# Patient Record
Sex: Female | Born: 1950
Health system: Southern US, Community
[De-identification: ages and names within clinical notes are randomized; demographics above are authoritative.]

## PROBLEM LIST (undated history)

## (undated) DIAGNOSIS — E119 Type 2 diabetes mellitus without complications: Secondary | ICD-10-CM

## (undated) DIAGNOSIS — R079 Chest pain, unspecified: Secondary | ICD-10-CM

## (undated) DIAGNOSIS — M069 Rheumatoid arthritis, unspecified: Secondary | ICD-10-CM

## (undated) DIAGNOSIS — I10 Essential (primary) hypertension: Secondary | ICD-10-CM

## (undated) DIAGNOSIS — E785 Hyperlipidemia, unspecified: Secondary | ICD-10-CM

## (undated) DIAGNOSIS — K219 Gastro-esophageal reflux disease without esophagitis: Secondary | ICD-10-CM

## (undated) HISTORY — DX: Gastro-esophageal reflux disease without esophagitis: K21.9

## (undated) HISTORY — DX: Rheumatoid arthritis, unspecified: M06.9

## (undated) HISTORY — DX: Hyperlipidemia, unspecified: E78.5

## (undated) HISTORY — PX: CARPAL TUNNEL RELEASE: SHX101

## (undated) HISTORY — PX: TONSILLECTOMY: SUR1361

## (undated) HISTORY — DX: Chest pain, unspecified: R07.9

## (undated) HISTORY — PX: NEUROPLASTY / TRANSPOSITION MEDIAN NERVE AT CARPAL TUNNEL: SUR893

---

## 1997-11-08 ENCOUNTER — Ambulatory Visit (HOSPITAL_BASED_OUTPATIENT_CLINIC_OR_DEPARTMENT_OTHER): Admission: RE | Admit: 1997-11-08 | Discharge: 1997-11-08 | Payer: Self-pay | Admitting: Orthopedic Surgery

## 1998-12-08 ENCOUNTER — Encounter: Payer: Self-pay | Admitting: Interventional Cardiology

## 1998-12-08 ENCOUNTER — Ambulatory Visit (HOSPITAL_COMMUNITY): Admission: RE | Admit: 1998-12-08 | Discharge: 1998-12-08 | Payer: Self-pay | Admitting: Interventional Cardiology

## 2000-06-17 HISTORY — PX: ROUX-EN-Y GASTRIC BYPASS: SHX1104

## 2008-04-14 ENCOUNTER — Ambulatory Visit (HOSPITAL_BASED_OUTPATIENT_CLINIC_OR_DEPARTMENT_OTHER): Admission: RE | Admit: 2008-04-14 | Discharge: 2008-04-14 | Payer: Self-pay | Admitting: Orthopedic Surgery

## 2009-02-09 ENCOUNTER — Ambulatory Visit (HOSPITAL_COMMUNITY): Admission: RE | Admit: 2009-02-09 | Discharge: 2009-02-09 | Payer: Self-pay | Admitting: Dermatology

## 2009-06-17 HISTORY — PX: CORONARY ANGIOPLASTY WITH STENT PLACEMENT: SHX49

## 2009-12-08 ENCOUNTER — Inpatient Hospital Stay (HOSPITAL_BASED_OUTPATIENT_CLINIC_OR_DEPARTMENT_OTHER): Admission: RE | Admit: 2009-12-08 | Discharge: 2009-12-08 | Payer: Self-pay | Admitting: Interventional Cardiology

## 2009-12-14 ENCOUNTER — Ambulatory Visit (HOSPITAL_COMMUNITY): Admission: RE | Admit: 2009-12-14 | Discharge: 2009-12-15 | Payer: Self-pay | Admitting: Interventional Cardiology

## 2010-01-04 ENCOUNTER — Encounter (HOSPITAL_COMMUNITY): Admission: RE | Admit: 2010-01-04 | Discharge: 2010-03-16 | Payer: Self-pay | Admitting: Interventional Cardiology

## 2010-03-17 ENCOUNTER — Encounter (HOSPITAL_COMMUNITY): Admission: RE | Admit: 2010-03-17 | Discharge: 2010-05-16 | Payer: Self-pay | Admitting: Interventional Cardiology

## 2010-08-30 LAB — GLUCOSE, CAPILLARY
Glucose-Capillary: 69 mg/dL — ABNORMAL LOW (ref 70–99)
Glucose-Capillary: 74 mg/dL (ref 70–99)
Glucose-Capillary: 97 mg/dL (ref 70–99)

## 2010-08-31 LAB — GLUCOSE, CAPILLARY: Glucose-Capillary: 92 mg/dL (ref 70–99)

## 2010-09-01 LAB — GLUCOSE, CAPILLARY
Glucose-Capillary: 105 mg/dL — ABNORMAL HIGH (ref 70–99)
Glucose-Capillary: 140 mg/dL — ABNORMAL HIGH (ref 70–99)
Glucose-Capillary: 150 mg/dL — ABNORMAL HIGH (ref 70–99)
Glucose-Capillary: 156 mg/dL — ABNORMAL HIGH (ref 70–99)
Glucose-Capillary: 68 mg/dL — ABNORMAL LOW (ref 70–99)
Glucose-Capillary: 92 mg/dL (ref 70–99)
Glucose-Capillary: 92 mg/dL (ref 70–99)

## 2010-09-02 LAB — BASIC METABOLIC PANEL
BUN: 11 mg/dL (ref 6–23)
CO2: 27 mEq/L (ref 19–32)
Calcium: 8.9 mg/dL (ref 8.4–10.5)
Chloride: 108 mEq/L (ref 96–112)
Creatinine, Ser: 0.85 mg/dL (ref 0.4–1.2)
GFR calc Af Amer: >60 mL/min (ref >60)
GFR calc non Af Amer: >60 mL/min (ref >60)
Glucose, Bld: 97 mg/dL (ref 70–99)
Potassium: 4.8 mEq/L (ref 3.5–5.1)
Sodium: 143 mEq/L (ref 135–145)

## 2010-09-02 LAB — CBC
HCT: 29.6 % — ABNORMAL LOW (ref 36.0–46.0)
Hemoglobin: 9.9 g/dL — ABNORMAL LOW (ref 12.0–15.0)
MCH: 29.5 pg (ref 26.0–34.0)
MCHC: 33.5 g/dL (ref 30.0–36.0)
MCV: 88.1 fL (ref 78.0–100.0)
Platelets: 159 10*3/uL (ref 150–400)
RBC: 3.37 MIL/uL — ABNORMAL LOW (ref 3.87–5.11)
RDW: 12.3 % (ref 11.5–15.5)
WBC: 7.6 10*3/uL (ref 4.0–10.5)

## 2010-09-02 LAB — POCT I-STAT GLUCOSE
Glucose, Bld: 69 mg/dL — ABNORMAL LOW (ref 70–99)
Operator id: 194801

## 2010-09-02 LAB — GLUCOSE, CAPILLARY
Glucose-Capillary: 86 mg/dL (ref 70–99)
Glucose-Capillary: 133 mg/dL — ABNORMAL HIGH (ref 70–99)
Glucose-Capillary: 66 mg/dL — ABNORMAL LOW (ref 70–99)
Glucose-Capillary: 74 mg/dL (ref 70–99)
Glucose-Capillary: 86 mg/dL (ref 70–99)
Glucose-Capillary: 88 mg/dL (ref 70–99)

## 2010-10-30 NOTE — Op Note (Signed)
Darlene Rivera            ACCOUNT NO.:  [id number]   MEDICAL RECORD NO.:  [id number]          PATIENT TYPE:  AMB   LOCATION:  DSC                          FACILITY:  [facility]   PHYSICIAN:  Katy Fitch. Sypher, M.D. DATE OF BIRTH:  1951-05-31   DATE OF PROCEDURE:  04/14/2008  DATE OF DISCHARGE:                               OPERATIVE REPORT   PREOPERATIVE DIAGNOSIS:  Left carpal tunnel syndrome and left ulnar  nerve compression at elbow.   POSTOPERATIVE DIAGNOSIS:  Left carpal tunnel syndrome and left ulnar  nerve compression at elbow.   OPERATIONS:  Release of left transverse carpal ligament and release of  left ulnar nerve at cubital tunnel in situ.   OPERATING SURGEON:  Katy Fitch. Sypher, MD   ASSISTANT:  Marveen Reeks Dasnoit, PA-C   ANESTHESIA:  General by endotracheal technique.   SUPERVISING ANESTHESIOLOGIST:  Kaylyn Layer. Michelle Piper, MD   INDICATIONS:  Darlene Rivera is a 61 year old woman with diabetes referred  by Dr. Margaretmary Bayley for evaluation and management of arm numbness and  arm pain.  Clinical examination revealed probable rheumatoid arthritis.  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  She has been treated for this  disorder, but still has nerve compression.  Electrodiagnostic studies  confirmed left carpal tunnel syndrome and left ulnar neuropathy at the  elbow.  She is now brought to the operating room for decompression of  the ulnar nerve and median nerve.   PROCEDURE:  Darlene Rivera was brought to the operating room and  placed in supine position upon the operating table.   Following the induction of general anesthesia by LMA technique, the left  arm was prepped with Betadine soap solution, sterilely draped.  A  pneumatic tourniquet was applied to the proximal brachium.  Following  exsanguination of the left arm with an Esmarch bandage, the arterial  tourniquet was inflated to 220 mmHg.  The  procedure commenced with a  short incision in the palm in the line of the ring finger.  Subcutaneous  tissues were carefully divided revealing the palmar fascia.  This was  split longitudinally to reveal the common sensory branch of the median  nerve.  These were followed with the transcarpal ligament, which was  separated from the median nerve.  The ligament was released along its  ulnar border into the distal forearm.  The canal was inspected for  masses, none were identified.  The wound was repaired with intradermal 3-  0 Prolene and Steri-Strips.  Attention was then directed to the elbow.  A 4-cm incision was fashioned posterior to the medial epicondyle.  This  was taken down through the subcutaneous fascia to reveal the posterior  branch of the medial antebrachial cutaneous nerve.  This was gently  retracted.  The arcuate ligament was identified and released.  The ulnar  nerve was decompressed at 6 cm above the epicondyle and 6 cm distally.   The nerve was stable in situ.  Bleeding points were electrocauterized  with bipolar current.  The wound was then inspected for bleeding points,  subsequently repaired with  subcutaneous suture of 3-0 Vicryl and  intradermal through Prolene with Steri-Strips.  There were no apparent  complications.   Darlene Rivera tolerated the surgery and anesthesia well.  She was  transferred to the recovery room with stable signs.   She will be discharged to the care of her family with prescription for  Percocet 5 mg one p.o. q.4-6 h. p.r.n. pain, 20 tablets, without refill.      Katy Fitch Sypher, M.D.  Electronically Signed     RVS/MEDQ  D:  04/14/2008  T:  04/14/2008  Job:  [id number]   cc:   Margaretmary Bayley, M.D.

## 2011-03-19 LAB — POCT HEMOGLOBIN-HEMACUE: Hemoglobin: 9.4 — ABNORMAL LOW

## 2011-03-19 LAB — BASIC METABOLIC PANEL
BUN: 12
CO2: 29
Calcium: 9.7
Chloride: 105
Creatinine, Ser: 0.72
GFR calc Af Amer: >60
GFR calc non Af Amer: >60
Glucose, Bld: 128 — ABNORMAL HIGH
Potassium: 3.3 — ABNORMAL LOW
Sodium: 144

## 2011-03-19 LAB — GLUCOSE, CAPILLARY
Glucose-Capillary: 278 — ABNORMAL HIGH
Glucose-Capillary: 78
Glucose-Capillary: 78

## 2013-03-17 ENCOUNTER — Emergency Department (HOSPITAL_BASED_OUTPATIENT_CLINIC_OR_DEPARTMENT_OTHER)
Admission: EM | Admit: 2013-03-17 | Discharge: 2013-03-17 | Disposition: A | Discharge status: Home / Self Care | Payer: Managed Care, Other (non HMO) | Attending: Emergency Medicine | Admitting: Emergency Medicine

## 2013-03-17 ENCOUNTER — Encounter (HOSPITAL_BASED_OUTPATIENT_CLINIC_OR_DEPARTMENT_OTHER): Payer: Self-pay | Admitting: Emergency Medicine

## 2013-03-17 DIAGNOSIS — E119 Type 2 diabetes mellitus without complications: Secondary | ICD-10-CM | POA: Insufficient documentation

## 2013-03-17 DIAGNOSIS — R490 Dysphonia: Secondary | ICD-10-CM | POA: Insufficient documentation

## 2013-03-17 DIAGNOSIS — R112 Nausea with vomiting, unspecified: Secondary | ICD-10-CM | POA: Insufficient documentation

## 2013-03-17 DIAGNOSIS — J209 Acute bronchitis, unspecified: Secondary | ICD-10-CM | POA: Insufficient documentation

## 2013-03-17 DIAGNOSIS — R739 Hyperglycemia, unspecified: Secondary | ICD-10-CM

## 2013-03-17 DIAGNOSIS — I2510 Atherosclerotic heart disease of native coronary artery without angina pectoris: Secondary | ICD-10-CM | POA: Insufficient documentation

## 2013-03-17 DIAGNOSIS — Z9861 Coronary angioplasty status: Secondary | ICD-10-CM | POA: Insufficient documentation

## 2013-03-17 DIAGNOSIS — Z794 Long term (current) use of insulin: Secondary | ICD-10-CM | POA: Insufficient documentation

## 2013-03-17 DIAGNOSIS — I10 Essential (primary) hypertension: Secondary | ICD-10-CM | POA: Insufficient documentation

## 2013-03-17 DIAGNOSIS — R42 Dizziness and giddiness: Secondary | ICD-10-CM | POA: Insufficient documentation

## 2013-03-17 HISTORY — DX: Essential (primary) hypertension: I10

## 2013-03-17 HISTORY — DX: Type 2 diabetes mellitus without complications: E11.9

## 2013-03-17 LAB — POCT I-STAT 3, VENOUS BLOOD GAS (G3P V)
Acid-base deficit: 2 mmol/L (ref 0.0–2.0)
Bicarbonate: 22.2 mEq/L (ref 20.0–24.0)
O2 Saturation: 69 %
TCO2: 23 mmol/L (ref 0–100)
pCO2, Ven: 35.3 mmHg — ABNORMAL LOW (ref 45.0–50.0)
pH, Ven: 7.407 — ABNORMAL HIGH (ref 7.250–7.300)
pO2, Ven: 36 mmHg (ref 30.0–45.0)

## 2013-03-17 LAB — BASIC METABOLIC PANEL
BUN: 17 mg/dL (ref 6–23)
CO2: 25 mEq/L (ref 19–32)
Calcium: 9.5 mg/dL (ref 8.4–10.5)
Chloride: 104 mEq/L (ref 96–112)
Creatinine, Ser: 0.8 mg/dL (ref 0.50–1.10)
GFR calc Af Amer: 90 mL/min — ABNORMAL LOW (ref >90)
GFR calc non Af Amer: 77 mL/min — ABNORMAL LOW (ref >90)
Glucose, Bld: 230 mg/dL — ABNORMAL HIGH (ref 70–99)
Potassium: 4.3 mEq/L (ref 3.5–5.1)
Sodium: 140 mEq/L (ref 135–145)

## 2013-03-17 LAB — TROPONIN I: Troponin I: <0.3 ng/mL (ref <0.30)

## 2013-03-17 LAB — CBC WITH DIFFERENTIAL/PLATELET
Basophils Absolute: 0 10*3/uL (ref 0.0–0.1)
Basophils Relative: 0 % (ref 0–1)
Eosinophils Absolute: 0.1 10*3/uL (ref 0.0–0.7)
Eosinophils Relative: 1 % (ref 0–5)
HCT: 33.2 % — ABNORMAL LOW (ref 36.0–46.0)
Hemoglobin: 10.4 g/dL — ABNORMAL LOW (ref 12.0–15.0)
Lymphocytes Relative: 11 % — ABNORMAL LOW (ref 12–46)
Lymphs Abs: 1.4 10*3/uL (ref 0.7–4.0)
MCH: 27.7 pg (ref 26.0–34.0)
MCHC: 31.3 g/dL (ref 30.0–36.0)
MCV: 88.3 fL (ref 78.0–100.0)
Monocytes Absolute: 0.8 10*3/uL (ref 0.1–1.0)
Monocytes Relative: 6 % (ref 3–12)
Neutro Abs: 10.4 10*3/uL — ABNORMAL HIGH (ref 1.7–7.7)
Neutrophils Relative %: 82 % — ABNORMAL HIGH (ref 43–77)
Platelets: 174 10*3/uL (ref 150–400)
RBC: 3.76 MIL/uL — ABNORMAL LOW (ref 3.87–5.11)
RDW: 12.4 % (ref 11.5–15.5)
WBC: 12.7 10*3/uL — ABNORMAL HIGH (ref 4.0–10.5)

## 2013-03-17 LAB — GLUCOSE, CAPILLARY
Glucose-Capillary: 300 mg/dL — ABNORMAL HIGH (ref 70–99)
Glucose-Capillary: 149 mg/dL — ABNORMAL HIGH (ref 70–99)

## 2013-03-17 MED ORDER — SODIUM CHLORIDE 0.9 % IV BOLUS (SEPSIS)
1000.0000 mL | Freq: Once | INTRAVENOUS | Status: AC
  Administered 2013-03-17: 1000 mL via INTRAVENOUS

## 2013-03-17 NOTE — ED Notes (Signed)
Pt reports her CBG prior to arrival was greater than 400, states she took Novolog 70/30, 20U 1 hour ago - did not take morning dose, on antibiotics for URI.

## 2013-03-17 NOTE — ED Provider Notes (Signed)
I assumed care of this patient from Dr. Elesa Massed. BMP and troponin pending.  BMP shows no electrolyte abnormalities and troponin is negative. Patient will be discharged home with PCP followup.'  Labs Reviewed  GLUCOSE, CAPILLARY - Abnormal; Notable for the following:    Glucose-Capillary 300 (*)    All other components within normal limits  CBC WITH DIFFERENTIAL - Abnormal; Notable for the following:    WBC 12.7 (*)    RBC 3.76 (*)    Hemoglobin 10.4 (*)    HCT 33.2 (*)    Neutrophils Relative % 82 (*)    Neutro Abs 10.4 (*)    Lymphocytes Relative 11 (*)    All other components within normal limits  BASIC METABOLIC PANEL - Abnormal; Notable for the following:    Glucose, Bld 230 (*)    GFR calc non Af Amer 77 (*)    GFR calc Af Amer 90 (*)    All other components within normal limits  POCT I-STAT 3, BLOOD GAS (G3P V) - Abnormal; Notable for the following:    pH, Ven 7.407 (*)    pCO2, Ven 35.3 (*)    All other components within normal limits  TROPONIN I     Shon Baton, MD 03/17/13 1623

## 2013-03-17 NOTE — ED Notes (Signed)
20 units novolog 1 hr ago Pt states "it was 400 before"

## 2013-03-17 NOTE — ED Provider Notes (Signed)
TIME SEEN: 1:52 PM  CHIEF COMPLAINT: Hyperglycemia  HPI: Patient is a 62 year old female with a history of insulin-dependent diabetes, hypertension, CAD status post stent placement, gastric bypass who presents the emergency department with hyperglycemia. Patient reports that for the past 2-3 days she has had hoarse voice, productive cough with yellow sputum. She was seen by her primary care physician today and diagnosed with bronchitis and started on Mucinex and azithromycin. She did not eat before going to her doctor's office and on the way to work stopped at [employer] and had [employer] minis and a sweet tea.  At work she gave herself 20 units of NovoLog 70/30 and then began eating. She states after eating, she began feeling lightheaded, nauseous, began sweating. She took her sugar and it was greater than 470. She states her glucose is never greater than 200. She has never had DKA. She did have one episode of vomiting in the car on the way to the ED. She denies that she had any chest pain or chest discomfort or shortness of breath. She denies that the symptoms are similar to her prior heart disease where she had neck and jaw pain. She reports that she is feeling much better and her blood glucose in the ED is 300.  She denies any recent fever. No recent international travel. No known sick contacts.  ROS: See HPI Constitutional: no fever  Eyes: no drainage  ENT: no runny nose   Cardiovascular:  no chest pain  Resp: no SOB  GI: vomiting GU: no dysuria Integumentary: no rash  Allergy: no hives  Musculoskeletal: no leg swelling  Neurological: no slurred speech ROS otherwise negative  PAST MEDICAL HISTORY/PAST SURGICAL HISTORY:  Past Medical History  Diagnosis Date  . Diabetes mellitus without complication   . Hypertension     MEDICATIONS:  Prior to Admission medications   Not on File    ALLERGIES:  Allergies  Allergen Reactions  . Sulfa Antibiotics     SOCIAL HISTORY:   History  Substance Use Topics  . Smoking status: Never Smoker   . Smokeless tobacco: Not on file  . Alcohol Use: No    FAMILY HISTORY: History reviewed. No pertinent family history.  EXAM: BP 142/84  Temp(Src) 97.6 F (36.4 C) (Oral)  Resp 18  SpO2 100% CONSTITUTIONAL: Alert and oriented and responds appropriately to questions. Well-appearing; well-nourished HEAD: Normocephalic EYES: Conjunctivae clear, PERRL ENT: normal nose; no rhinorrhea; moist mucous membranes; pharynx without lesions noted, TMs are clear bilaterally NECK: Supple, no meningismus, no LAD  CARD: RRR; S1 and S2 appreciated; no murmurs, no clicks, no rubs, no gallops RESP: Normal chest excursion without splinting or tachypnea; breath sounds clear and equal bilaterally; no wheezes, no rhonchi, no rales,  ABD/GI: Normal bowel sounds; non-distended; soft, non-tender, no rebound, no guarding BACK:  The back appears normal and is non-tender to palpation, there is no CVA tenderness EXT: Normal ROM in all joints; non-tender to palpation; no edema; normal capillary refill; no cyanosis    SKIN: Normal color for age and race; warm NEURO: Moves all extremities equally, no facial droop, no slurred speech PSYCH: The patient's mood and manner are appropriate. Grooming and personal hygiene are appropriate.  MEDICAL DECISION MAKING: Patient with upper respiratory symptoms for the past several days. She has been started on azithromycin by her primary care physician. Have not concerned for pneumonia as her lungs are clear her oxygen saturation is 100% and she is afebrile. Patient's hyperglycemia is likely in the setting  of poor diet control today. I will check basic labs, EKG, troponin and give IV fluids. Anticipate discharge home.    Date: 03/17/2013 14:04  Rate: 79  Rhythm: normal sinus rhythm  QRS Axis: normal  Intervals: normal  ST/T Wave abnormalities: normal  Conduction Disutrbances: none  Narrative Interpretation:  unremarkable; no ischemic changes, no arrhythmia     ED PROGRESS: PH is 7.407, PCO2 35.3, bicarbonate 22.2.  CBC shows mild leukocytosis with left shift. BMP and troponin pending. Patient is still asymptomatic. Hemodynamically stable. Signed out to Dr. Wilkie Aye who will followup on labs. Patient updated on plan.     Layla Maw Eryck Negron, DO 03/17/13 1544

## 2013-03-17 NOTE — ED Notes (Signed)
Pt reports high blood sugar today at the dr office. She went for URI and cough s/s. Also reports taking cough syrup OTC, sweet tea and [employer] minis x2

## 2013-03-20 ENCOUNTER — Other Ambulatory Visit: Payer: Self-pay | Admitting: Interventional Cardiology

## 2013-05-29 ENCOUNTER — Encounter: Payer: Self-pay | Admitting: Interventional Cardiology

## 2013-05-29 ENCOUNTER — Encounter: Payer: Self-pay | Admitting: *Deleted

## 2013-05-29 DIAGNOSIS — M069 Rheumatoid arthritis, unspecified: Secondary | ICD-10-CM | POA: Insufficient documentation

## 2013-05-29 DIAGNOSIS — R079 Chest pain, unspecified: Secondary | ICD-10-CM | POA: Insufficient documentation

## 2013-05-29 DIAGNOSIS — K219 Gastro-esophageal reflux disease without esophagitis: Secondary | ICD-10-CM | POA: Insufficient documentation

## 2013-05-29 DIAGNOSIS — E118 Type 2 diabetes mellitus with unspecified complications: Secondary | ICD-10-CM | POA: Insufficient documentation

## 2013-05-29 DIAGNOSIS — E785 Hyperlipidemia, unspecified: Secondary | ICD-10-CM | POA: Insufficient documentation

## 2013-05-29 DIAGNOSIS — I10 Essential (primary) hypertension: Secondary | ICD-10-CM | POA: Insufficient documentation

## 2013-05-31 ENCOUNTER — Ambulatory Visit (INDEPENDENT_AMBULATORY_CARE_PROVIDER_SITE_OTHER): Payer: Managed Care, Other (non HMO) | Admitting: Interventional Cardiology

## 2013-05-31 ENCOUNTER — Encounter: Payer: Self-pay | Admitting: Interventional Cardiology

## 2013-05-31 VITALS — BP 142/86 | HR 78 | Ht 66.0 in | Wt 213.0 lb

## 2013-05-31 DIAGNOSIS — E785 Hyperlipidemia, unspecified: Secondary | ICD-10-CM

## 2013-05-31 DIAGNOSIS — R079 Chest pain, unspecified: Secondary | ICD-10-CM

## 2013-05-31 DIAGNOSIS — I2510 Atherosclerotic heart disease of native coronary artery without angina pectoris: Secondary | ICD-10-CM

## 2013-05-31 DIAGNOSIS — I10 Essential (primary) hypertension: Secondary | ICD-10-CM

## 2013-05-31 NOTE — Patient Instructions (Addendum)
Your physician recommends that you continue on your current medications as directed. Please refer to the Current Medication list given to you today.  Your physician has requested that you have a lexiscan myoview. For further information please visit [email or web address] Please follow instruction sheet, as given.  Follow up pending results of stress myoview

## 2013-05-31 NOTE — Progress Notes (Signed)
Patient ID: Darlene Rivera, female   DOB: 1951/01/22, 62 y.o.   MRN: [id number]    [address] [place], [place]  [address] Phone: [phone] Fax:  [phone]  Date:  05/31/2013   ID:  Darlene Rivera, DOB Jan 22, 1951, MRN [id number]  PCP:  Clifton Custard, MD   ASSESSMENT:  1. Chest tightness with left jaw radiation, uncertain etiology. Occurs abdominally with supine position. Rule out atypical angina 2. Coronary artery disease with prior history of circumflex DES 2011 and moderate LAD and right coronary disease. 3. Hypertension, controlled 4. Hyperlipidemia 5. Diabetes mellitus   PLAN:  1. Stress Cardiolite to rule out high risk substrate  2. Continue aggressive risk factor modification  3. Consider empiric trial of H2 blocker or proton pump inhibitor therapy if nuclear study is low risk.   SUBJECTIVE: Darlene Rivera is a 62 y.o. female who saw Dr. Chestine Spore recently. She has been having intermittent episodes of left neck jaw and upper chest discomfort. Many times is most noticeable when she is recumbent trying to get off to sleep. She does relatively physical activity at one of her jobs and has not noted any impairment in her abilities over the past 3 months. She rarely has any discomfort or dyspnea with these activities. This certainly been no change.   Wt Readings from Last 3 Encounters:  05/31/13 213 lb (96.616 kg)     Past Medical History  Diagnosis Date  . Diabetes mellitus without complication   . Hypertension   . Chest pain   . GERD (gastroesophageal reflux disease)   . Rheumatoid arthritis(714.0)   . Hyperlipidemia     Current Outpatient Prescriptions  Medication Sig Dispense Refill  . Canagliflozin (INVOKANA) 100 MG TABS Take by mouth daily.      . cholecalciferol (VITAMIN D) 1000 UNITS tablet Take 2,000 Units by mouth daily.      . ferrous fumarate (HEMOCYTE - 106 MG FE) 325 (106 FE) MG TABS tablet Take 1 tablet by mouth daily before  lunch.      . insulin lispro protamine-lispro (HUMALOG 75/25) (75-25) 100 UNIT/ML SUSP injection Inject into the skin daily.      . Omega-3 Fatty Acids (FISH OIL) 1000 MG CAPS Take 2,000 mg by mouth daily.      . SitaGLIPtin-MetFORMIN HCl (JANUMET XR) 50-1000 MG TB24 Take by mouth daily.      Marland Kitchen aspirin 325 MG tablet Take 325 mg by mouth daily.      . Cyanocobalamin (VITAMIN B-12 PO) Take 1 tablet by mouth daily.      Marland Kitchen lisinopril-hydrochlorothiazide (PRINZIDE,ZESTORETIC) 20-12.5 MG per tablet Take 1 tablet by mouth daily.      . metoprolol tartrate (LOPRESSOR) 25 MG tablet Take 25 mg by mouth 2 (two) times daily.      . Multiple Vitamin (MULTIVITAMIN) capsule Take 1 capsule by mouth daily.      . nitroGLYCERIN (NITROSTAT) 0.4 MG SL tablet Place 0.4 mg under the tongue every 5 (five) minutes as needed for chest pain.      . simvastatin (ZOCOR) 20 MG tablet TAKE 1 TABLET BY MOUTH EVERY EVENING  30 tablet  9   No current facility-administered medications for this visit.    Allergies:    Allergies  Allergen Reactions  . Sulfa Antibiotics     Social History:  The patient  reports that she has never smoked. She does not have any smokeless tobacco history on file. She reports that she does  not drink alcohol or use illicit drugs.   ROS:  Please see the history of present illness.   No blood in urine or stool . Does have lower extremity edema that develops as the day progresses.  All other systems reviewed and negative.   OBJECTIVE: VS:  BP 142/86  Pulse 78  Ht 5\' 6"  (1.676 m)  Wt 213 lb (96.616 kg)  BMI 34.40 kg/m2 Well nourished, well developed, in no acute distress, mildly obese with stable  HEENT: normal Neck: JVD flat. Carotid bruit absent  Cardiac:  normal S1, S2; RRR; no murmur Lungs:  clear to auscultation bilaterally, no wheezing, rhonchi or rales Abd: soft, nontender, no hepatomegaly Ext: Edema absent. Pulses 2+  Skin: warm and dry Neuro:  CNs 2-12 intact, no focal  abnormalities noted  EKG:  Normal       Signed, Darci Needle III, MD 05/31/2013 9:25 AM  Past Medical History  Diabetes mellitus x 30 years   Hypertension   Rheumatoid arthritis   GERD   DES stent to circumflex..12/14/09

## 2013-06-03 ENCOUNTER — Ambulatory Visit (HOSPITAL_COMMUNITY): Payer: Managed Care, Other (non HMO) | Attending: Cardiology | Admitting: Radiology

## 2013-06-03 ENCOUNTER — Encounter: Payer: Self-pay | Admitting: Cardiology

## 2013-06-03 VITALS — BP 149/59 | HR 89 | Ht 66.0 in | Wt 212.0 lb

## 2013-06-03 DIAGNOSIS — I2510 Atherosclerotic heart disease of native coronary artery without angina pectoris: Secondary | ICD-10-CM

## 2013-06-03 DIAGNOSIS — R079 Chest pain, unspecified: Secondary | ICD-10-CM

## 2013-06-03 MED ORDER — TECHNETIUM TC 99M SESTAMIBI GENERIC - CARDIOLITE: 33.0000 | Freq: Once | INTRAVENOUS | Status: AC | PRN

## 2013-06-03 MED ORDER — TECHNETIUM TC 99M SESTAMIBI GENERIC - CARDIOLITE: 11.0000 | Freq: Once | INTRAVENOUS | Status: AC | PRN

## 2013-06-03 MED ORDER — REGADENOSON 0.4 MG/5ML IV SOLN: 0.4000 mg | Freq: Once | INTRAVENOUS | Status: DC

## 2013-06-03 NOTE — Progress Notes (Signed)
[facility] SITE 3 NUCLEAR MED [address] [place], [place] [address] [phone]    Cardiology Nuclear Med Study  Darlene Rivera is a 62 y.o. female     MRN : [id number]     DOB: 1951-05-07  Procedure Date: 06/03/2013  Nuclear Med Background Indication for Stress Test:  Evaluation for Ischemia and Stent Patency History:  2011 Stent-CFx with residual nonobst. dz Cardiac Risk Factors: Family History - CAD, Hypertension, IDDM Type 2 and Lipids  Symptoms:  Chest Tightness with Rest and Exertion (last date of chest discomfort 2 days ago), Diaphoresis, Nausea, Palpitations and SOB   Nuclear Pre-Procedure Caffeine/Decaff Intake:  8:00pm NPO After: 8:00pm   Lungs:  clear O2 Sat: 98% on room air. IV 0.9% NS with Angio Cath:  22g  IV Site: R Hand  IV Started by:  Doyne Keel, CNMT  Chest Size (in):  42 Cup Size: D  Height: 5\' 6"  (1.676 m)  Weight:  212 lb (96.163 kg)  BMI:  Body mass index is 34.23 kg/(m^2). Tech Comments:  Held all am meds; didn't check CBG this am    Nuclear Med Study 1 or 2 day study: 1 day  Stress Test Type:  Lexiscan  Reading MD: Olga Millers, MD  Order Authorizing Provider:  Mendel Ryder, MD  Resting Radionuclide: Technetium 77m Sestamibi  Resting Radionuclide Dose: 11.0 mCi   Stress Radionuclide:  Technetium 9m Sestamibi  Stress Radionuclide Dose: 33.0 mCi           Stress Protocol Rest HR: 89 Stress HR: 116  Rest BP: 149/59 Stress BP: 171/42  Exercise Time (min): n/a METS: n/a           Dose of Adenosine (mg):  n/a Dose of Lexiscan: 0.4 mg  Dose of Atropine (mg): n/a Dose of Dobutamine: n/a mcg/kg/min (at max HR)  Stress Test Technologist: Nelson Chimes, BS-ES  Nuclear Technologist:  Domenic Polite, CNMT     Rest Procedure:  Myocardial perfusion imaging was performed at rest 45 minutes following the intravenous administration of Technetium 67m Sestamibi. Rest ECG: NSR - Normal EKG  Stress Procedure:  The patient received IV  Lexiscan 0.4 mg over 15-seconds.  Technetium 69m Sestamibi injected at 30-seconds.  Quantitative spect images were obtained after a 45 minute delay.  During the infusion of Lexiscan, the patient complained of slight SOB, leg discomfort and headache.  Symptoms began to resolve with the exception of the headache.   Stress ECG: No significant change from baseline ECG  QPS Raw Data Images:  Mild breast attenuation.  Normal left ventricular size. Stress Images:  There is decreased uptake in the lateral wall. Rest Images:  There is decreased uptake in the lateral wall. Subtraction (SDS):  No evidence of ischemia. Transient Ischemic Dilatation (Normal <1.22):  0.91 Lung/Heart Ratio (Normal <0.45):  0.33  Quantitative Gated Spect Images QGS EDV:  66 ml QGS ESV:  18 ml  Impression Exercise Capacity:  Lexiscan with no exercise. BP Response:  Normal blood pressure response. Clinical Symptoms:  There is dyspnea. ECG Impression:  No significant ST segment change suggestive of ischemia. Comparison with Prior Nuclear Study: No images to compare  Overall Impression:  Low risk stress nuclear study with small fixed lateral wall defect..  LV Ejection Fraction: 73%.  LV Wall Motion:  NL LV Function; NL Wall Motion

## 2013-06-08 ENCOUNTER — Telehealth: Payer: Self-pay

## 2013-06-08 NOTE — Telephone Encounter (Signed)
called to give pt nuclear study results.left message  at pt home with her sister. lmom on pt cell to return call

## 2013-06-08 NOTE — Telephone Encounter (Signed)
Follow up    Pt returned nurses call to get stress test results

## 2013-06-08 NOTE — Telephone Encounter (Signed)
Message copied by Jarvis Newcomer on Tue Jun 08, 2013  1:10 PM ------      Message from: Verdis Prime      Created: Mon Jun 07, 2013  6:11 PM       The nuclear study is only mildly abnormal. This is in the same region as the blood vessel with a stent. I would characterize the study is low risk and we should continue to follow clinically and treat with medication rather than doing procedures. If symptoms worsen she should call and we will need to perform coronary angiography. ------

## 2013-06-09 NOTE — Telephone Encounter (Signed)
Pt given results of nuclear study. The nuclear study is only mildly abnormal. This is in the same region as the blood vessel with a stent. I would characterize the study is low risk and we should continue to follow clinically and treat with medication rather than doing procedures. If symptoms worsen she should call and we will need to perform coronary angiography.pt verbalized understanding.

## 2013-06-09 NOTE — Telephone Encounter (Signed)
Message copied by Jarvis Newcomer on Wed Jun 09, 2013  8:51 AM ------      Message from: Verdis Prime      Created: Mon Jun 07, 2013  6:11 PM       The nuclear study is only mildly abnormal. This is in the same region as the blood vessel with a stent. I would characterize the study is low risk and we should continue to follow clinically and treat with medication rather than doing procedures. If symptoms worsen she should call and we will need to perform coronary angiography. ------

## 2013-10-07 ENCOUNTER — Telehealth: Payer: Self-pay | Admitting: Interventional Cardiology

## 2013-10-07 NOTE — Telephone Encounter (Signed)
lmom with pt sister for pt to call back.lmom on pt cell

## 2013-10-07 NOTE — Telephone Encounter (Signed)
New message          Pt is has a irregular heartbeat. Is pt in afib? Pt is going to be sedated for surgery.

## 2013-10-20 ENCOUNTER — Encounter: Payer: Self-pay | Admitting: Interventional Cardiology

## 2013-10-21 NOTE — Telephone Encounter (Signed)
Follow up      Given patient medical history - irregular heartbeat, can patient sedated for oral surgery.     What's the condition behind . Patient will be put under general anesthesia .

## 2013-10-22 NOTE — Telephone Encounter (Signed)
spoke with Joni Reining at pt dentist office.she sts that plan has change the pt will not be sedated for her dental procedure, just given local anethesia.clearance not needed.

## 2013-12-09 ENCOUNTER — Ambulatory Visit: Payer: Self-pay | Admitting: Interventional Cardiology

## 2014-01-06 ENCOUNTER — Ambulatory Visit: Payer: Self-pay | Admitting: Interventional Cardiology

## 2014-01-07 ENCOUNTER — Other Ambulatory Visit: Payer: Self-pay

## 2014-01-07 MED ORDER — SIMVASTATIN 20 MG PO TABS: ORAL_TABLET | ORAL | Status: DC

## 2014-01-24 ENCOUNTER — Other Ambulatory Visit: Payer: Self-pay | Admitting: *Deleted

## 2014-01-24 MED ORDER — METOPROLOL TARTRATE 25 MG PO TABS: 25.0000 mg | ORAL_TABLET | Freq: Two times a day (BID) | ORAL | Status: DC

## 2014-03-24 ENCOUNTER — Ambulatory Visit: Payer: Self-pay | Admitting: Interventional Cardiology

## 2014-05-11 ENCOUNTER — Ambulatory Visit (INDEPENDENT_AMBULATORY_CARE_PROVIDER_SITE_OTHER): Payer: Managed Care, Other (non HMO) | Admitting: Interventional Cardiology

## 2014-05-11 ENCOUNTER — Encounter: Payer: Self-pay | Admitting: Interventional Cardiology

## 2014-05-11 VITALS — BP 140/82 | HR 87 | Ht 66.0 in | Wt 214.8 lb

## 2014-05-11 DIAGNOSIS — E118 Type 2 diabetes mellitus with unspecified complications: Secondary | ICD-10-CM

## 2014-05-11 DIAGNOSIS — E785 Hyperlipidemia, unspecified: Secondary | ICD-10-CM

## 2014-05-11 DIAGNOSIS — I10 Essential (primary) hypertension: Secondary | ICD-10-CM

## 2014-05-11 DIAGNOSIS — I2510 Atherosclerotic heart disease of native coronary artery without angina pectoris: Secondary | ICD-10-CM

## 2014-05-11 NOTE — Patient Instructions (Signed)
Your physician wants you to follow-up in:  12 months. You will receive a reminder letter in the mail two months in advance. If you don't receive a letter, please call our office to schedule the follow-up appointment.  Your physician has requested that you have an abdominal aorta duplex. During this test, an ultrasound is used to evaluate the aorta. Allow 30 minutes for this exam. Do not eat after midnight the day before and avoid carbonated beverages

## 2014-05-11 NOTE — Progress Notes (Signed)
Patient ID: Darlene Rivera, female   DOB: 12/27/1950, 63 y.o.   MRN: [id number]    [address] [place], [place]  [address] Phone: [phone] Fax:  [phone]  Date:  05/11/2014   ID:  Darlene Rivera, DOB [birth year], MRN [id number]  PCP:  Clifton Custard, MD   ASSESSMENT:  1. Coronary artery disease with circumflex DES 2011 and moderate residual LAD disease 2. Diabetes mellitus, with vascular complications 3. Hyperlipidemia 4. Essential hypertension  PLAN:  1. Abdominal ultrasound to rule out abdominal aortic aneurysm 2. Continue same medical regimen 3. Aerobic exercise to reduce weight 4. I'll follow-up with me in one year   SUBJECTIVE: Darlene Rivera is a 63 y.o. female who is doing well. She has exertional dyspnea but no orthopnea. There is no peripheral edema. She denies angina. She takes medications as prescribed. Blood sugars have not been under good control. No neurological complaints.   Wt Readings from Last 3 Encounters:  05/11/14 214 lb 12.8 oz (97.433 kg)  06/03/13 212 lb (96.163 kg)  05/31/13 213 lb (96.616 kg)     Past Medical History  Diagnosis Date  . Diabetes mellitus without complication   . Hypertension   . Chest pain   . GERD (gastroesophageal reflux disease)   . Rheumatoid arthritis(714.0)   . Hyperlipidemia     Current Outpatient Prescriptions  Medication Sig Dispense Refill  . amoxicillin-clavulanate (AUGMENTIN) 500-125 MG per tablet 2 (two) times daily.    Marland Kitchen aspirin 325 MG tablet Take 325 mg by mouth daily.    . Calcium Carbonate-Vitamin D (CALCIUM-VITAMIN D) 500-200 MG-UNIT per tablet Take 1 tablet by mouth daily.    . Canagliflozin (INVOKANA) 100 MG TABS Take by mouth daily.    . cholecalciferol (VITAMIN D) 1000 UNITS tablet Take 2,000 Units by mouth daily.    . Cyanocobalamin (VITAMIN B-12 PO) Take 1 tablet by mouth daily.    . ferrous fumarate (HEMOCYTE - 106 MG FE) 325 (106 FE) MG TABS tablet Take 1 tablet by mouth  daily before lunch.    . fluocinonide cream (LIDEX) 0.05 % as needed.    Marland Kitchen lisinopril-hydrochlorothiazide (PRINZIDE,ZESTORETIC) 20-12.5 MG per tablet Take 1 tablet by mouth daily.    . metoprolol tartrate (LOPRESSOR) 25 MG tablet Take 1 tablet (25 mg total) by mouth 2 (two) times daily. 60 tablet 1  . Multiple Vitamin (MULTIVITAMIN) capsule Take 1 capsule by mouth daily.    . nitroGLYCERIN (NITROSTAT) 0.4 MG SL tablet Place 0.4 mg under the tongue every 5 (five) minutes as needed for chest pain.    Marland Kitchen NOVOLOG MIX 70/30 FLEXPEN (70-30) 100 UNIT/ML Pen 2 (two) times daily.  99  . Omega-3 Fatty Acids (FISH OIL) 1000 MG CAPS Take 2,000 mg by mouth daily.    . simvastatin (ZOCOR) 20 MG tablet TAKE 1 TABLET BY MOUTH EVERY EVENING 30 tablet 9  . SitaGLIPtin-MetFORMIN HCl (JANUMET XR) 50-1000 MG TB24 Take by mouth daily.     No current facility-administered medications for this visit.    Allergies:    Allergies  Allergen Reactions  . Sulfa Antibiotics     Social History:  The patient  reports that she has never smoked. She does not have any smokeless tobacco history on file. She reports that she does not drink alcohol or use illicit drugs.   ROS:  Please see the history of present illness.   Denies palpitations. Denies headache. Denies claudication.   All other systems reviewed and  negative.   OBJECTIVE: VS:  BP 140/82 mmHg  Pulse 87  Ht 5\' 6"  (1.676 m)  Wt 214 lb 12.8 oz (97.433 kg)  BMI 34.69 kg/m2 Well nourished, well developed, in no acute distress, obese HEENT: normal Neck: JVD flat. Carotid bruit absent  Cardiac:  normal S1, S2; RRR; no murmur Lungs:  clear to auscultation bilaterally, no wheezing, rhonchi or rales Abd: soft, nontender, no hepatomegaly Ext: Edema absent. Pulses dorsalis pedis 2+ bilateral Skin: warm and dry Neuro:  CNs 2-12 intact, no focal abnormalities noted  EKG:  Normal with normal sinus rhythm       Signed, Darci Needle III, MD 05/11/2014 2:09 PM

## 2014-05-21 ENCOUNTER — Other Ambulatory Visit: Payer: Self-pay | Admitting: Interventional Cardiology

## 2014-06-08 ENCOUNTER — Ambulatory Visit (HOSPITAL_COMMUNITY): Payer: Managed Care, Other (non HMO) | Attending: Cardiovascular Disease | Admitting: Cardiology

## 2014-06-08 DIAGNOSIS — I10 Essential (primary) hypertension: Secondary | ICD-10-CM

## 2014-06-08 DIAGNOSIS — I700 Atherosclerosis of aorta: Secondary | ICD-10-CM | POA: Diagnosis not present

## 2014-06-08 DIAGNOSIS — E119 Type 2 diabetes mellitus without complications: Secondary | ICD-10-CM | POA: Diagnosis not present

## 2014-06-08 DIAGNOSIS — E785 Hyperlipidemia, unspecified: Secondary | ICD-10-CM | POA: Insufficient documentation

## 2014-06-08 DIAGNOSIS — I2510 Atherosclerotic heart disease of native coronary artery without angina pectoris: Secondary | ICD-10-CM

## 2014-06-08 NOTE — Progress Notes (Signed)
Abdominal Aorta Duplex performed  

## 2014-06-21 ENCOUNTER — Telehealth: Payer: Self-pay

## 2014-06-21 NOTE — Telephone Encounter (Signed)
-----   Message from Lesleigh Noe, MD sent at 06/17/2014  6:02 PM EST ----- No aneurysm

## 2014-06-21 NOTE — Telephone Encounter (Signed)
called to give to give pt  AAA duplex results. lmtcb

## 2014-08-22 ENCOUNTER — Other Ambulatory Visit: Payer: Self-pay | Admitting: Interventional Cardiology

## 2014-10-31 ENCOUNTER — Other Ambulatory Visit: Payer: Self-pay | Admitting: Interventional Cardiology

## 2015-02-26 ENCOUNTER — Other Ambulatory Visit: Payer: Self-pay | Admitting: Interventional Cardiology

## 2015-04-13 ENCOUNTER — Ambulatory Visit: Payer: Self-pay | Admitting: Allergy and Immunology

## 2015-05-27 ENCOUNTER — Other Ambulatory Visit: Payer: Self-pay | Admitting: Interventional Cardiology

## 2015-06-08 ENCOUNTER — Encounter: Payer: Self-pay | Admitting: Interventional Cardiology

## 2015-06-26 ENCOUNTER — Ambulatory Visit: Payer: Managed Care, Other (non HMO) | Admitting: Physician Assistant

## 2015-06-27 ENCOUNTER — Encounter: Payer: Self-pay | Admitting: Interventional Cardiology

## 2015-06-27 ENCOUNTER — Encounter: Payer: Self-pay | Admitting: Physician Assistant

## 2015-06-27 ENCOUNTER — Ambulatory Visit (INDEPENDENT_AMBULATORY_CARE_PROVIDER_SITE_OTHER): Payer: Managed Care, Other (non HMO) | Admitting: Physician Assistant

## 2015-06-27 VITALS — BP 130/76 | HR 93 | Wt 220.0 lb

## 2015-06-27 DIAGNOSIS — R0789 Other chest pain: Secondary | ICD-10-CM | POA: Diagnosis not present

## 2015-06-27 DIAGNOSIS — R0989 Other specified symptoms and signs involving the circulatory and respiratory systems: Secondary | ICD-10-CM

## 2015-06-27 MED ORDER — ISOSORBIDE MONONITRATE ER 30 MG PO TB24: 30.0000 mg | ORAL_TABLET | Freq: Every day | ORAL | Status: DC

## 2015-06-27 NOTE — Patient Instructions (Signed)
Your physician has recommended you make the following change in your medication:  1.) start IMDUR 30 mg once daily  Your physician has requested that you have a lexiscan myoview. For further information please visit [email or web address] Please follow instruction sheet, as given.  Your physician has requested that you have a carotid duplex. This test is an ultrasound of the carotid arteries in your neck. It looks at blood flow through these arteries that supply the brain with blood. Allow one hour for this exam. There are no restrictions or special instructions.  Your physician recommends that you schedule a follow-up appointment in: 1 month with Dr. Katrinka Blazing or Jacolyn Reedy.

## 2015-06-27 NOTE — Assessment & Plan Note (Signed)
Patient had DES to the circumflex in 2011 with residual 50% LAD and 60% RCA. She is having some exertional chest tightness. Proceed with Lexi scan Myoview.

## 2015-06-27 NOTE — Assessment & Plan Note (Signed)
Blood Pressure controlled 

## 2015-06-27 NOTE — Assessment & Plan Note (Signed)
Patient recently had blood work by Dr. Chestine Spore and will obtain a copy of her lipid panel for [place].

## 2015-06-27 NOTE — Progress Notes (Signed)
Cardiology Office Note   Date:  06/27/2015   ID:  Darlene Rivera, DOB July 12, 1950, MRN [id number]  PCP:  Laurena Slimmer, MD  Cardiologist: Dr. Verdis Prime  Chief Complaint: Yearly follow-up/chest pain    History of Present Illness: Darlene Rivera is a 65 y.o. female who presents for yearly follow-up. She has a history of CAD status post DES to the circumflex in 2011 with moderate residual LAD disease. She also has hypertension, hyperlipidemia and diabetes mellitus. Abdominal aortic ultrasound last year was negative for AAA.  Patient's office changed in October and she has to climb stairs to get there. She noticed with climbing stairs she becomes short of breath and has a little chest tightness. She stops halfway up and the pain eases and she continues on. She has had no rest pain. She has had no radiation of the pain like she did in 2011. She has gained weight and her hemoglobin A1c is 8.1.    Past Medical History  Diagnosis Date  . Diabetes mellitus without complication (HCC)   . Hypertension   . Chest pain   . GERD (gastroesophageal reflux disease)   . Rheumatoid arthritis(714.0)   . Hyperlipidemia     Past Surgical History  Procedure Laterality Date  . Coronary stent placement    . Gastric bypass    . Carpal tunnel release       Current Outpatient Prescriptions  Medication Sig Dispense Refill  . Calcium Carbonate-Vitamin D (CALCIUM-VITAMIN D) 500-200 MG-UNIT per tablet Take 1 tablet by mouth daily.    . cholecalciferol (VITAMIN D) 1000 UNITS tablet Take 2,000 Units by mouth daily.    . Cyanocobalamin (VITAMIN B-12 PO) Take 1 tablet by mouth daily.    . ferrous fumarate (HEMOCYTE - 106 MG FE) 325 (106 FE) MG TABS tablet Take 1 tablet by mouth daily before lunch.    . fluocinonide cream (LIDEX) 0.05 % as needed.    Marland Kitchen lisinopril-hydrochlorothiazide (PRINZIDE,ZESTORETIC) 20-12.5 MG per tablet Take 1 tablet by mouth daily.    . metoprolol tartrate (LOPRESSOR) 25 MG tablet  TAKE 1 TABLET BY MOUTH TWICE A DAY. PLEASE SCHEDULE APPOINTMENT 180 tablet 0  . Multiple Vitamin (MULTIVITAMIN) capsule Take 1 capsule by mouth daily.    . nitroGLYCERIN (NITROSTAT) 0.4 MG SL tablet Place 0.4 mg under the tongue every 5 (five) minutes as needed for chest pain.    Marland Kitchen NOVOLOG MIX 70/30 FLEXPEN (70-30) 100 UNIT/ML Pen 2 (two) times daily.  99  . Omega-3 Fatty Acids (FISH OIL) 1000 MG CAPS Take 2,000 mg by mouth daily.    . simvastatin (ZOCOR) 20 MG tablet TAKE 1 TABLET BY MOUTH EVERY EVENING 30 tablet 9  . SitaGLIPtin-MetFORMIN HCl (JANUMET XR) 50-1000 MG TB24 Take by mouth daily.    Marland Kitchen aspirin 325 MG tablet Take 325 mg by mouth daily. Reported on 06/27/2015     No current facility-administered medications for this visit.    Allergies:   Sulfa antibiotics    Social History:  The patient  reports that she has never smoked. She does not have any smokeless tobacco history on file. She reports that she does not drink alcohol or use illicit drugs.   Family History:  The patient's    family history includes Diabetes in her brother and mother; Hypertension in her mother and sister.    ROS:  Please see the history of present illness.   Otherwise, review of systems are positive for back pain and leg pain.   All  other systems are reviewed and negative.    PHYSICAL EXAM: VS:  BP 130/76 mmHg  Pulse 93  Wt 220 lb (99.791 kg)  SpO2 99% , BMI Body mass index is 35.53 kg/(m^2). GEN: Obese, well developed, in no acute distress Neck: Bilateral carotid bruits no JVD, HJR, or masses Cardiac: RRR; 1/6 systolic murmur at the left sternal border, no,gallop, rubs, thrill or heave,  Respiratory:  clear to auscultation bilaterally, normal work of breathing GI: soft, nontender, nondistended, + BS MS: no deformity or atrophy Extremities: without cyanosis, clubbing, edema, good distal pulses bilaterally.  Skin: warm and dry, no rash Neuro:  Strength and sensation are intact    EKG:  EKG is  ordered today. The ekg ordered today demonstrates    Recent Labs: No results found for requested labs within last 365 days.    Lipid Panel No results found for: CHOL, TRIG, HDL, CHOLHDL, VLDL, LDLCALC, LDLDIRECT    Wt Readings from Last 3 Encounters:  06/27/15 220 lb (99.791 kg)  05/11/14 214 lb 12.8 oz (97.433 kg)  06/03/13 212 lb (96.163 kg)      Other studies Reviewed: Additional studies/ records that were reviewed today include and review of the records demonstrates:  Nuclear stress test 06/03/13 Exercise Capacity:  Lexiscan with no exercise. BP Response:  Normal blood pressure response. Clinical Symptoms:  There is dyspnea. ECG Impression:  No significant ST segment change suggestive of ischemia. Comparison with Prior Nuclear Study: No images to compare  Overall Impression:  Low risk stress nuclear study with small fixed lateral wall defect..  LV Ejection Fraction: 73%.  LV Wall Motion:  NL LV Function; NL Wall Motion  Notes Recorded by Lesleigh Noe, MD on 06/07/2013 at 6:11 PM The nuclear study is only mildly abnormal. This is in the same region as the blood vessel with a stent. I would characterize the study is low risk and we should continue to follow clinically and treat with medication rather than doing procedures. If symptoms worsen she should call and we will need to perform coronary angiography.         Abdominal ultrasound 2015 no aneurysm  ASSESSMENT AND PLAN:  Chest pain Patient is having exertional chest tightness and shortness of breath since October when she climbs stairs. Last Lexi scan Myoview in 2014 was low risk. Patient's hemoglobin A1c is 8.1. Recommend repeat Lexi scan Myoview to rule out ischemia. Proceed to the ER for prolonged chest pain. We'll add Imdur were 30 mg once daily. Follow-up with Dr. Katrinka Blazing or myself in one month.  Hyperlipidemia Patient recently had blood work by Dr. Chestine Spore and will obtain a copy of her lipid panel for  [place].  Hypertension Blood Pressure controlled  Coronary atherosclerosis of native coronary artery Patient had DES to the circumflex in 2011 with residual 50% LAD and 60% RCA. She is having some exertional chest tightness. Proceed with Lexi scan Myoview.  Bilateral carotid bruits Check carotid Dopplers    Signed, Jacolyn Reedy, PA-C  06/27/2015 8:41 AM    [facility] [address] [place], [place], [place]  [address] Phone: [phone]; Fax: [phone]

## 2015-06-27 NOTE — Assessment & Plan Note (Signed)
Check carotid Dopplers °

## 2015-06-27 NOTE — Assessment & Plan Note (Signed)
Patient is having exertional chest tightness and shortness of breath since October when she climbs stairs. Last Lexi scan Myoview in 2014 was low risk. Patient's hemoglobin A1c is 8.1. Recommend repeat Lexi scan Myoview to rule out ischemia. Proceed to the ER for prolonged chest pain. We'll add Imdur were 30 mg once daily. Follow-up with Dr. Katrinka Blazing or myself in one month.

## 2015-07-12 ENCOUNTER — Telehealth (HOSPITAL_COMMUNITY): Payer: Self-pay

## 2015-07-12 NOTE — Telephone Encounter (Signed)
Encounter complete. 

## 2015-07-13 ENCOUNTER — Telehealth (HOSPITAL_COMMUNITY): Payer: Self-pay

## 2015-07-13 NOTE — Telephone Encounter (Signed)
Encounter complete. 

## 2015-07-14 ENCOUNTER — Ambulatory Visit (HOSPITAL_COMMUNITY)
Admission: RE | Admit: 2015-07-14 | Discharge: 2015-07-14 | Disposition: A | Discharge status: Home / Self Care | Payer: Managed Care, Other (non HMO) | Source: Ambulatory Visit | Attending: Physician Assistant | Admitting: Physician Assistant

## 2015-07-14 ENCOUNTER — Ambulatory Visit (HOSPITAL_COMMUNITY)
Admission: RE | Admit: 2015-07-14 | Discharge: 2015-07-14 | Disposition: A | Discharge status: Home / Self Care | Payer: Managed Care, Other (non HMO) | Source: Ambulatory Visit | Attending: Cardiovascular Disease | Admitting: Cardiovascular Disease

## 2015-07-14 DIAGNOSIS — R0609 Other forms of dyspnea: Secondary | ICD-10-CM | POA: Insufficient documentation

## 2015-07-14 DIAGNOSIS — E663 Overweight: Secondary | ICD-10-CM | POA: Insufficient documentation

## 2015-07-14 DIAGNOSIS — I10 Essential (primary) hypertension: Secondary | ICD-10-CM | POA: Diagnosis not present

## 2015-07-14 DIAGNOSIS — R0789 Other chest pain: Secondary | ICD-10-CM

## 2015-07-14 DIAGNOSIS — R9439 Abnormal result of other cardiovascular function study: Secondary | ICD-10-CM | POA: Diagnosis not present

## 2015-07-14 DIAGNOSIS — Z8249 Family history of ischemic heart disease and other diseases of the circulatory system: Secondary | ICD-10-CM | POA: Diagnosis not present

## 2015-07-14 DIAGNOSIS — I6523 Occlusion and stenosis of bilateral carotid arteries: Secondary | ICD-10-CM | POA: Insufficient documentation

## 2015-07-14 DIAGNOSIS — E119 Type 2 diabetes mellitus without complications: Secondary | ICD-10-CM | POA: Diagnosis not present

## 2015-07-14 DIAGNOSIS — Z6835 Body mass index (BMI) 35.0-35.9, adult: Secondary | ICD-10-CM | POA: Diagnosis not present

## 2015-07-14 DIAGNOSIS — R0989 Other specified symptoms and signs involving the circulatory and respiratory systems: Secondary | ICD-10-CM | POA: Diagnosis not present

## 2015-07-14 LAB — MYOCARDIAL PERFUSION IMAGING
LV dias vol: 58 mL
LV sys vol: 12 mL
Peak HR: 109 {beats}/min
Rest HR: 86 {beats}/min
SDS: 4
SRS: 1
SSS: 5
TID: 1.03

## 2015-07-14 MED ORDER — REGADENOSON 0.4 MG/5ML IV SOLN
0.4000 mg | Freq: Once | INTRAVENOUS | Status: AC
  Administered 2015-07-14: 0.4 mg via INTRAVENOUS

## 2015-07-14 MED ORDER — TECHNETIUM TC 99M SESTAMIBI GENERIC - CARDIOLITE
10.2000 | Freq: Once | INTRAVENOUS | Status: AC | PRN
  Administered 2015-07-14: 10 via INTRAVENOUS

## 2015-07-14 MED ORDER — TECHNETIUM TC 99M SESTAMIBI GENERIC - CARDIOLITE
30.9000 | Freq: Once | INTRAVENOUS | Status: AC | PRN
  Administered 2015-07-14: 30.9 via INTRAVENOUS

## 2015-07-19 ENCOUNTER — Telehealth: Payer: Self-pay | Admitting: Interventional Cardiology

## 2015-07-19 DIAGNOSIS — I6523 Occlusion and stenosis of bilateral carotid arteries: Secondary | ICD-10-CM

## 2015-07-19 NOTE — Telephone Encounter (Signed)
F/u  Pt retruing RN phone call- can leave detailed vm. Please call back and discuss.

## 2015-07-19 NOTE — Telephone Encounter (Signed)
Follow up      Returning a call to the nurse.  Pt is at work and cannot take phone calls.  Please leave a message on her home phone.  She will not be home until after 5:30

## 2015-07-19 NOTE — Telephone Encounter (Signed)
Returning your call from yesterday. °

## 2015-07-20 NOTE — Telephone Encounter (Signed)
Returned pt call. lmtcb Pt has been playing phone tag with Waylan Boga

## 2015-07-20 NOTE — Telephone Encounter (Signed)
Pt's calling back to get test results, can only be reached at 330pm at [phone]

## 2015-07-20 NOTE — Telephone Encounter (Signed)
Pt aware of carotid results.      Pt aware of myoview results. Pt that she has had some improvement in her exertional chest tightness since starting Imdur. She feel she is tolerating Imdur ok. tried to get more info, she states that she was on break and had to go back to work, she will call back at another time.

## 2015-07-20 NOTE — Telephone Encounter (Signed)
F/u  Pt returninng RN phone call- test restuls. Please call back and discuss.

## 2015-07-20 NOTE — Telephone Encounter (Signed)
Pt aware of carotid results. 1-39% right ICA stenosis.  40-59% left ICA stenosis Carotids ok. Repeat in 1 yr. Tell her Dr. Katrinka Blazing is reviewing her nuclear stress test and we'll be in touch Pt verbalized understanding.

## 2015-07-20 NOTE — Telephone Encounter (Signed)
F/u ° ° °Pt returning call to nurse. °

## 2015-08-08 ENCOUNTER — Encounter: Payer: Self-pay | Admitting: *Deleted

## 2015-08-08 ENCOUNTER — Other Ambulatory Visit: Payer: Self-pay | Admitting: Physician Assistant

## 2015-08-08 ENCOUNTER — Ambulatory Visit (INDEPENDENT_AMBULATORY_CARE_PROVIDER_SITE_OTHER): Payer: Managed Care, Other (non HMO) | Admitting: Physician Assistant

## 2015-08-08 ENCOUNTER — Encounter: Payer: Self-pay | Admitting: Physician Assistant

## 2015-08-08 VITALS — BP 122/70 | HR 81 | Ht 66.0 in | Wt 222.0 lb

## 2015-08-08 DIAGNOSIS — I10 Essential (primary) hypertension: Secondary | ICD-10-CM | POA: Diagnosis not present

## 2015-08-08 DIAGNOSIS — R079 Chest pain, unspecified: Secondary | ICD-10-CM

## 2015-08-08 DIAGNOSIS — I2510 Atherosclerotic heart disease of native coronary artery without angina pectoris: Secondary | ICD-10-CM

## 2015-08-08 DIAGNOSIS — E785 Hyperlipidemia, unspecified: Secondary | ICD-10-CM

## 2015-08-08 DIAGNOSIS — Z01812 Encounter for preprocedural laboratory examination: Secondary | ICD-10-CM

## 2015-08-08 DIAGNOSIS — E118 Type 2 diabetes mellitus with unspecified complications: Secondary | ICD-10-CM

## 2015-08-08 DIAGNOSIS — R0989 Other specified symptoms and signs involving the circulatory and respiratory systems: Secondary | ICD-10-CM

## 2015-08-08 LAB — BASIC METABOLIC PANEL
BUN: 21 mg/dL (ref 7–25)
CO2: 16 mmol/L — ABNORMAL LOW (ref 20–31)
Calcium: 8.8 mg/dL (ref 8.6–10.4)
Chloride: 108 mmol/L (ref 98–110)
Creat: 0.98 mg/dL (ref 0.50–0.99)
Glucose, Bld: 137 mg/dL — ABNORMAL HIGH (ref 65–99)
Potassium: 5 mmol/L (ref 3.5–5.3)
Sodium: 140 mmol/L (ref 135–146)

## 2015-08-08 LAB — CBC
HCT: 37.2 % (ref 36.0–46.0)
Hemoglobin: 11.9 g/dL — ABNORMAL LOW (ref 12.0–15.0)
MCH: 27.8 pg (ref 26.0–34.0)
MCHC: 32 g/dL (ref 30.0–36.0)
MCV: 86.9 fL (ref 78.0–100.0)
MPV: 12.3 fL (ref 8.6–12.4)
Platelets: 183 10*3/uL (ref 150–400)
RBC: 4.28 MIL/uL (ref 3.87–5.11)
RDW: 13.3 % (ref 11.5–15.5)
WBC: 9.2 10*3/uL (ref 4.0–10.5)

## 2015-08-08 LAB — PROTIME-INR
INR: 0.95 (ref <1.50)
Prothrombin Time: 12.8 seconds (ref 11.6–15.2)

## 2015-08-08 NOTE — Progress Notes (Signed)
Cardiology Office Note   Date:  08/08/2015   ID:  Darlene Rivera, DOB October 14, 1950, MRN [id number]  PCP:  Darlene Slimmer, MD  Cardiologist:  Dr. Verdis Prime Chief Complaint: Chest pain    History of Present Illness: Darlene Rivera is a 65 y.o. female who presents for follow-up of chest pain and exercise Myoview. I saw her on 06/27/15 at which time she was complaining of worsening dyspnea on exertion a little chest tightness. She has history of CAD status post DES to the circumflex in 2011 with moderate residual LAD disease. She also has hypertension, hyperlipidemia, DM with most recent hemoglobin A1c 8.1. Abdominal aortic ultrasound was negative for AAA. I added Imdur to her medications.  Nuclear stress test showed EF to be 79% with no ST changes during stress there was a small defect of moderate severity present in the mid inferolateral location consistent with prior infarct in the circumflex territory. There was another small defect of moderate severity present in the anterior apical and apex location consistent with ischemia in the LAD territory. This was read as a low risk study. She is brought back today to discuss possible cardiac catheterization. Carotid Dopplers were also performed and stable. See below for details.  Occasionally having chest tightness at rest, 1-2/ week lasting 1-2 minutes eases spontaneously.She has associated belching which relieves it also. She thinks it's acid reflux. Not associated with any food. Dyspnea on exertion like walking to her office associated with chest tightness using with rest. She has not used nitroglycerin. The Imdur has definitely helped but she still has to stop going up stairs or on her way into the office to get relief. Discussed with Dr. Katrinka Blazing who recommends cardiac catheterization. Patient is agreeable.      Past Medical History  Diagnosis Date  . Diabetes mellitus without complication (HCC)   . Hypertension   . Chest pain   . GERD  (gastroesophageal reflux disease)   . Rheumatoid arthritis(714.0)   . Hyperlipidemia     Past Surgical History  Procedure Laterality Date  . Coronary stent placement    . Gastric bypass    . Carpal tunnel release       Current Outpatient Prescriptions  Medication Sig Dispense Refill  . aspirin 325 MG tablet Take 325 mg by mouth daily. Reported on 06/27/2015    . Calcium Carbonate-Vitamin D (CALCIUM-VITAMIN D) 500-200 MG-UNIT per tablet Take 1 tablet by mouth daily.    . cholecalciferol (VITAMIN D) 1000 UNITS tablet Take 2,000 Units by mouth daily.    . Cyanocobalamin (VITAMIN B-12 PO) Take 1 tablet by mouth daily.    . ferrous fumarate (HEMOCYTE - 106 MG FE) 325 (106 FE) MG TABS tablet Take 1 tablet by mouth daily before lunch.    . fluocinonide cream (LIDEX) 0.05 % as needed.    . isosorbide mononitrate (IMDUR) 30 MG 24 hr tablet Take 1 tablet (30 mg total) by mouth daily. 90 tablet 3  . lisinopril-hydrochlorothiazide (PRINZIDE,ZESTORETIC) 20-12.5 MG per tablet Take 1 tablet by mouth daily.    . metoprolol tartrate (LOPRESSOR) 25 MG tablet TAKE 1 TABLET BY MOUTH TWICE A DAY. PLEASE SCHEDULE APPOINTMENT 180 tablet 0  . Multiple Vitamin (MULTIVITAMIN) capsule Take 1 capsule by mouth daily.    Marland Kitchen NOVOLOG MIX 70/30 FLEXPEN (70-30) 100 UNIT/ML Pen 2 (two) times daily.  99  . Omega-3 Fatty Acids (FISH OIL) 1000 MG CAPS Take 2,000 mg by mouth daily.    . simvastatin (ZOCOR) 20 MG tablet  TAKE 1 TABLET BY MOUTH EVERY EVENING 30 tablet 9  . SitaGLIPtin-MetFORMIN HCl (JANUMET XR) 50-1000 MG TB24 Take by mouth daily.    . nitroGLYCERIN (NITROSTAT) 0.4 MG SL tablet Place 0.4 mg under the tongue every 5 (five) minutes as needed for chest pain. Reported on 08/08/2015     No current facility-administered medications for this visit.    Allergies:   Sulfa antibiotics    Social History:  The patient  reports that she has never smoked. She does not have any smokeless tobacco history on file. She  reports that she does not drink alcohol or use illicit drugs.   Family History:  The patient's    family history includes Diabetes in her brother and mother; Hypertension in her mother and sister.    ROS:  Please see the history of present illness.   Otherwise, review of systems are positive for none.   All other systems are reviewed and negative.    PHYSICAL EXAM: VS:  BP 122/70 mmHg  Pulse 81  Ht 5\' 6"  (1.676 m)  Wt 222 lb (100.699 kg)  BMI 35.85 kg/m2  SpO2 99% , BMI Body mass index is 35.85 kg/(m^2). GEN: Well nourished, well developed, in no acute distress Neck: no JVD, HJR, carotid bruits, or masses Cardiac:  RRR; as of S4, 2/6 systolic murmur at the left sternal border, no rubs, thrill or heave,  Respiratory:  clear to auscultation bilaterally, normal work of breathing GI: soft, nontender, nondistended, + BS MS: no deformity or atrophy Extremities: without cyanosis, clubbing, edema, good distal pulses bilaterally.  Skin: warm and dry, no rash Neuro:  Strength and sensation are intact    EKG:  EKG is ordered today. The ekg ordered today demonstrates  normal sinus rhythm with nonspecific ST-T wave changes, no acute change   Recent Labs: No results found for requested labs within last 365 days.    Lipid Panel No results found for: CHOL, TRIG, HDL, CHOLHDL, VLDL, LDLCALC, LDLDIRECT    Wt Readings from Last 3 Encounters:  08/08/15 222 lb (100.699 kg)  07/14/15 220 lb (99.791 kg)  06/27/15 220 lb (99.791 kg)      Other studies Reviewed: Additional studies/ records that were reviewed today include and review of the records demonstrates:    Nuclear stress EF: 79%.  The left ventricular ejection fraction is hyperdynamic (>65%).  There was no ST segment deviation noted during stress.  No T wave inversion was noted during stress.  Defect 1: There is a small defect of moderate severity present in the mid inferolateral location. Consistent with prior infarct in the  left circumflex artery territory.  Defect 2: There is a small defect of moderate severity present in the apical anterior and apex location. Consistent with ischemia in the left anterior descending artery territory.  This is a low risk study.   1-39% right ICA stenosis.  40-59% left ICA stenosis Carotids ok. Repeat in 1 yr  ASSESSMENT AND PLAN:  Chest pain Patient has exertional dyspnea and chest pain that has improved with Imdur. Nuclear stress test shows a small defect of moderate severity in the apical anterior and apex location consistent with ischemia in the LAD territory. She had moderate LAD disease at cath in 2011. Dr. Katrinka Blazing recommends cardiac catheterization and possible PCI. Patient is agreeable. We'll arrange for this Friday. I have reviewed the risks, indications, and alternatives to angioplasty and stenting with the patient. Risks include but are not limited to bleeding, infection, vascular injury,  stroke, myocardial infection, arrhythmia, kidney injury, radiation-related injury in the case of prolonged fluoroscopy use, emergency cardiac surgery, and death. The patient understands the risks of serious complication is low (<1%) and he agrees to proceed.     Hypertension Blood Pressure is controlled  Coronary atherosclerosis of native coronary artery Patient had DES to the circumflex in 2011 with residual LAD and RCA disease. Now with chest pain and abnormal nuclear stress test. Proceed with cardiac catheterization.  Diabetes mellitus with complication Patient's most recent hemoglobin A1c was 8.1. She says her sugars are difficult to control and she has gained weight. She no she needs to exercise but will have to wait until after her cardiac catheterization.  Bilateral carotid bruits Recent carotid Dopplers were reviewed and stable. 1-39% right ICA and 40-59% left ICA will need to repeat in 1 year.  Hyperlipidemia Continue Zocor. Will check fasting lipid  panel    Signed, Jacolyn Reedy, PA-C  08/08/2015 8:59 AM    [facility] [address] [place], [place], [place]  [address] Phone: [phone]; Fax: [phone]

## 2015-08-08 NOTE — Assessment & Plan Note (Signed)
Patient's most recent hemoglobin A1c was 8.1. She says her sugars are difficult to control and she has gained weight. She no she needs to exercise but will have to wait until after her cardiac catheterization.

## 2015-08-08 NOTE — Assessment & Plan Note (Signed)
Recent carotid Dopplers were reviewed and stable. 1-39% right ICA and 40-59% left ICA will need to repeat in 1 year.

## 2015-08-08 NOTE — Assessment & Plan Note (Signed)
Continue Zocor. Will check fasting lipid panel

## 2015-08-08 NOTE — Patient Instructions (Signed)
Medication Instructions:  None  Labwork: BMET, CBC, INR today  Testing/Procedures: Your physician has requested that you have a cardiac catheterization. Cardiac catheterization is used to diagnose and/or treat various heart conditions. Doctors may recommend this procedure for a number of different reasons. The most common reason is to evaluate chest pain. Chest pain can be a symptom of coronary artery disease (CAD), and cardiac catheterization can show whether plaque is narrowing or blocking your heart's arteries. This procedure is also used to evaluate the valves, as well as measure the blood flow and oxygen levels in different parts of your heart. For further information please visit [email or web address] Please follow instruction sheet, as given.   Follow-Up: Will be determined after your cath.  Any Other Special Instructions Will Be Listed Below (If Applicable).     If you need a refill on your cardiac medications before your next appointment, please call your pharmacy.

## 2015-08-08 NOTE — Assessment & Plan Note (Signed)
Blood Pressure is controlled 

## 2015-08-08 NOTE — Assessment & Plan Note (Signed)
Patient has exertional dyspnea and chest pain that has improved with Imdur. Nuclear stress test shows a small defect of moderate severity in the apical anterior and apex location consistent with ischemia in the LAD territory. She had moderate LAD disease at cath in 2011. Dr. Katrinka Blazing recommends cardiac catheterization and possible PCI. Patient is agreeable. We'll arrange for this Friday. I have reviewed the risks, indications, and alternatives to angioplasty and stenting with the patient. Risks include but are not limited to bleeding, infection, vascular injury, stroke, myocardial infection, arrhythmia, kidney injury, radiation-related injury in the case of prolonged fluoroscopy use, emergency cardiac surgery, and death. The patient understands the risks of serious complication is low (<1%) and he agrees to proceed.

## 2015-08-08 NOTE — Assessment & Plan Note (Signed)
Patient had DES to the circumflex in 2011 with residual LAD and RCA disease. Now with chest pain and abnormal nuclear stress test. Proceed with cardiac catheterization.

## 2015-08-11 ENCOUNTER — Encounter (HOSPITAL_COMMUNITY)
Admission: RE | Disposition: A | Discharge status: Home / Self Care | Payer: Self-pay | Source: Ambulatory Visit | Attending: Interventional Cardiology

## 2015-08-11 ENCOUNTER — Ambulatory Visit (HOSPITAL_COMMUNITY)
Admission: RE | Admit: 2015-08-11 | Discharge: 2015-08-12 | Disposition: A | Discharge status: Home / Self Care | Payer: Managed Care, Other (non HMO) | Source: Ambulatory Visit | Attending: Interventional Cardiology | Admitting: Interventional Cardiology

## 2015-08-11 ENCOUNTER — Encounter (HOSPITAL_COMMUNITY): Payer: Self-pay | Admitting: Interventional Cardiology

## 2015-08-11 DIAGNOSIS — E118 Type 2 diabetes mellitus with unspecified complications: Secondary | ICD-10-CM | POA: Diagnosis present

## 2015-08-11 DIAGNOSIS — I2584 Coronary atherosclerosis due to calcified coronary lesion: Secondary | ICD-10-CM | POA: Insufficient documentation

## 2015-08-11 DIAGNOSIS — I25118 Atherosclerotic heart disease of native coronary artery with other forms of angina pectoris: Secondary | ICD-10-CM | POA: Insufficient documentation

## 2015-08-11 DIAGNOSIS — I10 Essential (primary) hypertension: Secondary | ICD-10-CM | POA: Insufficient documentation

## 2015-08-11 DIAGNOSIS — E785 Hyperlipidemia, unspecified: Secondary | ICD-10-CM | POA: Diagnosis not present

## 2015-08-11 DIAGNOSIS — Z6835 Body mass index (BMI) 35.0-35.9, adult: Secondary | ICD-10-CM | POA: Diagnosis not present

## 2015-08-11 DIAGNOSIS — Z7982 Long term (current) use of aspirin: Secondary | ICD-10-CM | POA: Insufficient documentation

## 2015-08-11 DIAGNOSIS — E669 Obesity, unspecified: Secondary | ICD-10-CM | POA: Diagnosis not present

## 2015-08-11 DIAGNOSIS — E119 Type 2 diabetes mellitus without complications: Secondary | ICD-10-CM | POA: Diagnosis not present

## 2015-08-11 DIAGNOSIS — I2510 Atherosclerotic heart disease of native coronary artery without angina pectoris: Secondary | ICD-10-CM | POA: Diagnosis present

## 2015-08-11 DIAGNOSIS — R0989 Other specified symptoms and signs involving the circulatory and respiratory systems: Secondary | ICD-10-CM | POA: Diagnosis present

## 2015-08-11 DIAGNOSIS — I209 Angina pectoris, unspecified: Secondary | ICD-10-CM

## 2015-08-11 DIAGNOSIS — Z955 Presence of coronary angioplasty implant and graft: Secondary | ICD-10-CM

## 2015-08-11 HISTORY — PX: CARDIAC CATHETERIZATION: SHX172

## 2015-08-11 LAB — GLUCOSE, CAPILLARY
Glucose-Capillary: 96 mg/dL (ref 65–99)
Glucose-Capillary: 139 mg/dL — ABNORMAL HIGH (ref 65–99)
Glucose-Capillary: 78 mg/dL (ref 65–99)
Glucose-Capillary: 116 mg/dL — ABNORMAL HIGH (ref 65–99)

## 2015-08-11 LAB — POCT ACTIVATED CLOTTING TIME: Activated Clotting Time: 724 seconds

## 2015-08-11 SURGERY — LEFT HEART CATH AND CORONARY ANGIOGRAPHY

## 2015-08-11 MED ORDER — OXYCODONE-ACETAMINOPHEN 5-325 MG PO TABS: 1.0000 | ORAL_TABLET | ORAL | Status: DC | PRN

## 2015-08-11 MED ORDER — SODIUM CHLORIDE 0.9 % WEIGHT BASED INFUSION: 1.0000 mL/kg/h | INTRAVENOUS | Status: DC

## 2015-08-11 MED ORDER — VITAMIN B-12 100 MCG PO TABS
100.0000 ug | ORAL_TABLET | Freq: Every day | ORAL | Status: DC
  Administered 2015-08-11 – 2015-08-12 (×2): 100 ug via ORAL
  Filled 2015-08-11 (×2): qty 1

## 2015-08-11 MED ORDER — SODIUM CHLORIDE 0.9 % WEIGHT BASED INFUSION
3.0000 mL/kg/h | INTRAVENOUS | Status: DC
  Administered 2015-08-11: 3 mL/kg/h via INTRAVENOUS

## 2015-08-11 MED ORDER — HEPARIN SODIUM (PORCINE) 1000 UNIT/ML IJ SOLN
INTRAMUSCULAR | Status: DC | PRN
  Administered 2015-08-11: 5000 [IU] via INTRAVENOUS

## 2015-08-11 MED ORDER — FENTANYL CITRATE (PF) 100 MCG/2ML IJ SOLN
INTRAMUSCULAR | Status: AC
  Filled 2015-08-11: qty 2

## 2015-08-11 MED ORDER — VERAPAMIL HCL 2.5 MG/ML IV SOLN
INTRAVENOUS | Status: AC
  Filled 2015-08-11: qty 2

## 2015-08-11 MED ORDER — CALCIUM CARBONATE-VITAMIN D 500-200 MG-UNIT PO TABS
1.0000 | ORAL_TABLET | Freq: Every day | ORAL | Status: DC
  Administered 2015-08-11 – 2015-08-12 (×2): 1 via ORAL
  Filled 2015-08-11 (×2): qty 1

## 2015-08-11 MED ORDER — LIDOCAINE HCL (PF) 1 % IJ SOLN
INTRAMUSCULAR | Status: DC | PRN
  Administered 2015-08-11: 2 mL via SUBCUTANEOUS

## 2015-08-11 MED ORDER — ANGIOPLASTY BOOK
Freq: Once | Status: AC
  Administered 2015-08-11: 20:00:00
  Filled 2015-08-11: qty 1

## 2015-08-11 MED ORDER — MIDAZOLAM HCL 2 MG/2ML IJ SOLN
INTRAMUSCULAR | Status: AC
  Filled 2015-08-11: qty 2

## 2015-08-11 MED ORDER — SIMVASTATIN 20 MG PO TABS
20.0000 mg | ORAL_TABLET | Freq: Every evening | ORAL | Status: DC
Start: 2015-08-11 — End: 2015-08-12
  Administered 2015-08-11: 20 mg via ORAL
  Filled 2015-08-11: qty 1

## 2015-08-11 MED ORDER — LORATADINE 10 MG PO TABS
10.0000 mg | ORAL_TABLET | Freq: Every day | ORAL | Status: DC
  Administered 2015-08-11: 21:00:00 10 mg via ORAL
  Filled 2015-08-11 (×2): qty 1

## 2015-08-11 MED ORDER — NITROGLYCERIN 1 MG/10 ML FOR IR/CATH LAB
INTRA_ARTERIAL | Status: DC | PRN
  Administered 2015-08-11 (×2): 200 ug via INTRACORONARY

## 2015-08-11 MED ORDER — INSULIN ASPART PROT & ASPART (70-30 MIX) 100 UNIT/ML ~~LOC~~ SUSP
25.0000 [IU] | Freq: Every morning | SUBCUTANEOUS | Status: DC
  Administered 2015-08-11: 12.5 [IU] via SUBCUTANEOUS
  Administered 2015-08-12: 09:00:00 25 [IU] via SUBCUTANEOUS
  Filled 2015-08-11 (×2): qty 10

## 2015-08-11 MED ORDER — OMEGA-3-ACID ETHYL ESTERS 1 G PO CAPS
1.0000 g | ORAL_CAPSULE | Freq: Two times a day (BID) | ORAL | Status: DC
  Administered 2015-08-11 – 2015-08-12 (×3): 1 g via ORAL
  Filled 2015-08-11 (×3): qty 1

## 2015-08-11 MED ORDER — HEPARIN (PORCINE) IN NACL 2-0.9 UNIT/ML-% IJ SOLN
INTRAMUSCULAR | Status: AC
  Filled 2015-08-11: qty 1000

## 2015-08-11 MED ORDER — MULTIVITAMINS PO CAPS: 1.0000 | ORAL_CAPSULE | Freq: Every day | ORAL | Status: DC

## 2015-08-11 MED ORDER — HYDROCHLOROTHIAZIDE 12.5 MG PO CAPS
12.5000 mg | ORAL_CAPSULE | Freq: Every day | ORAL | Status: DC
Start: 2015-08-11 — End: 2015-08-12
  Administered 2015-08-11 – 2015-08-12 (×2): 12.5 mg via ORAL
  Filled 2015-08-11 (×2): qty 1

## 2015-08-11 MED ORDER — LISINOPRIL-HYDROCHLOROTHIAZIDE 20-12.5 MG PO TABS: 1.0000 | ORAL_TABLET | Freq: Every day | ORAL | Status: DC

## 2015-08-11 MED ORDER — HEPARIN SODIUM (PORCINE) 1000 UNIT/ML IJ SOLN
INTRAMUSCULAR | Status: AC
  Filled 2015-08-11: qty 1

## 2015-08-11 MED ORDER — TICAGRELOR 90 MG PO TABS
90.0000 mg | ORAL_TABLET | Freq: Two times a day (BID) | ORAL | Status: DC
  Administered 2015-08-11 – 2015-08-12 (×2): 90 mg via ORAL
  Filled 2015-08-11 (×2): qty 1

## 2015-08-11 MED ORDER — SODIUM CHLORIDE 0.9 % IV SOLN: 250.0000 mL | INTRAVENOUS | Status: DC | PRN

## 2015-08-11 MED ORDER — MIDAZOLAM HCL 2 MG/2ML IJ SOLN
INTRAMUSCULAR | Status: DC | PRN
  Administered 2015-08-11 (×2): 1 mg via INTRAVENOUS

## 2015-08-11 MED ORDER — ADULT MULTIVITAMIN W/MINERALS CH
1.0000 | ORAL_TABLET | Freq: Every day | ORAL | Status: DC
  Administered 2015-08-11 – 2015-08-12 (×2): 1 via ORAL
  Filled 2015-08-11 (×2): qty 1

## 2015-08-11 MED ORDER — NITROGLYCERIN 0.4 MG SL SUBL: 0.4000 mg | SUBLINGUAL_TABLET | SUBLINGUAL | Status: DC | PRN

## 2015-08-11 MED ORDER — METOPROLOL TARTRATE 25 MG PO TABS
25.0000 mg | ORAL_TABLET | Freq: Two times a day (BID) | ORAL | Status: DC
  Administered 2015-08-11 – 2015-08-12 (×3): 25 mg via ORAL
  Filled 2015-08-11 (×3): qty 1

## 2015-08-11 MED ORDER — SODIUM CHLORIDE 0.9 % WEIGHT BASED INFUSION: 1.0000 mL/kg/h | INTRAVENOUS | Status: AC

## 2015-08-11 MED ORDER — VERAPAMIL HCL 2.5 MG/ML IV SOLN
INTRAVENOUS | Status: DC | PRN
  Administered 2015-08-11: 08:00:00 via INTRA_ARTERIAL

## 2015-08-11 MED ORDER — TICAGRELOR 90 MG PO TABS
ORAL_TABLET | ORAL | Status: DC | PRN
  Administered 2015-08-11: 180 mg via ORAL

## 2015-08-11 MED ORDER — ASPIRIN 81 MG PO CHEW
CHEWABLE_TABLET | ORAL | Status: AC
  Filled 2015-08-11: qty 1

## 2015-08-11 MED ORDER — LISINOPRIL 10 MG PO TABS
20.0000 mg | ORAL_TABLET | Freq: Every day | ORAL | Status: DC
  Administered 2015-08-11 – 2015-08-12 (×2): 20 mg via ORAL
  Filled 2015-08-11 (×2): qty 2

## 2015-08-11 MED ORDER — SODIUM CHLORIDE 0.9% FLUSH: 3.0000 mL | INTRAVENOUS | Status: DC | PRN

## 2015-08-11 MED ORDER — ONDANSETRON HCL 4 MG/2ML IJ SOLN: 4.0000 mg | Freq: Four times a day (QID) | INTRAMUSCULAR | Status: DC | PRN

## 2015-08-11 MED ORDER — SODIUM CHLORIDE 0.9 % IV SOLN
250.0000 mg | INTRAVENOUS | Status: DC | PRN
  Administered 2015-08-11: 1.75 mg/kg/h via INTRAVENOUS

## 2015-08-11 MED ORDER — INSULIN ASPART PROT & ASPART (70-30 MIX) 100 UNIT/ML ~~LOC~~ SUSP
10.0000 [IU] | Freq: Every day | SUBCUTANEOUS | Status: DC
  Filled 2015-08-11: qty 10

## 2015-08-11 MED ORDER — LIVING WELL WITH DIABETES BOOK
Freq: Once | Status: AC
  Administered 2015-08-11: 20:00:00
  Filled 2015-08-11: qty 1

## 2015-08-11 MED ORDER — TICAGRELOR 90 MG PO TABS
ORAL_TABLET | ORAL | Status: AC
  Filled 2015-08-11: qty 1

## 2015-08-11 MED ORDER — IOHEXOL 350 MG/ML SOLN
INTRAVENOUS | Status: DC | PRN
  Administered 2015-08-11: 175 mL via INTRACARDIAC

## 2015-08-11 MED ORDER — BIVALIRUDIN BOLUS VIA INFUSION - CUPID
INTRAVENOUS | Status: DC | PRN
  Administered 2015-08-11: 74.85 mg via INTRAVENOUS

## 2015-08-11 MED ORDER — FERROUS FUMARATE 324 (106 FE) MG PO TABS
1.0000 | ORAL_TABLET | Freq: Every day | ORAL | Status: DC
  Administered 2015-08-11: 106 mg via ORAL
  Filled 2015-08-11 (×4): qty 1

## 2015-08-11 MED ORDER — BIVALIRUDIN 250 MG IV SOLR
INTRAVENOUS | Status: AC
  Filled 2015-08-11: qty 250

## 2015-08-11 MED ORDER — ASPIRIN 81 MG PO CHEW
81.0000 mg | CHEWABLE_TABLET | Freq: Every day | ORAL | Status: DC
  Administered 2015-08-12: 81 mg via ORAL
  Filled 2015-08-11: qty 1

## 2015-08-11 MED ORDER — LIDOCAINE HCL (PF) 1 % IJ SOLN
INTRAMUSCULAR | Status: AC
  Filled 2015-08-11: qty 30

## 2015-08-11 MED ORDER — ASPIRIN 81 MG PO CHEW
81.0000 mg | CHEWABLE_TABLET | ORAL | Status: AC
  Administered 2015-08-11: 81 mg via ORAL

## 2015-08-11 MED ORDER — VITAMIN D 1000 UNITS PO TABS
2000.0000 [IU] | ORAL_TABLET | Freq: Every day | ORAL | Status: DC
  Administered 2015-08-11 – 2015-08-12 (×2): 2000 [IU] via ORAL
  Filled 2015-08-11 (×2): qty 2

## 2015-08-11 MED ORDER — FENTANYL CITRATE (PF) 100 MCG/2ML IJ SOLN
INTRAMUSCULAR | Status: DC | PRN
  Administered 2015-08-11 (×2): 50 ug via INTRAVENOUS

## 2015-08-11 MED ORDER — HEPARIN (PORCINE) IN NACL 2-0.9 UNIT/ML-% IJ SOLN
INTRAMUSCULAR | Status: DC | PRN
  Administered 2015-08-11: 1500 mL

## 2015-08-11 MED ORDER — ACETAMINOPHEN 325 MG PO TABS: 650.0000 mg | ORAL_TABLET | ORAL | Status: DC | PRN

## 2015-08-11 MED ORDER — IPRATROPIUM BROMIDE 0.06 % NA SOLN
2.0000 | Freq: Every day | NASAL | Status: DC | PRN
  Filled 2015-08-11: qty 15

## 2015-08-11 MED ORDER — SODIUM CHLORIDE 0.9% FLUSH
3.0000 mL | Freq: Two times a day (BID) | INTRAVENOUS | Status: DC
  Administered 2015-08-11: 3 mL via INTRAVENOUS

## 2015-08-11 MED ORDER — FLUTICASONE PROPIONATE 50 MCG/ACT NA SUSP
1.0000 | Freq: Every day | NASAL | Status: DC
  Filled 2015-08-11: qty 16

## 2015-08-11 SURGICAL SUPPLY — 23 items
BALLN EUPHORA RX 2.5X12 (BALLOONS) ×2
BALLN MINITREK RX 2.0X8 (BALLOONS) ×2
BALLN ~~LOC~~ EMERGE MR 3.0X15 (BALLOONS) ×2
BALLN ~~LOC~~ EUPHORA RX 3.25X8 (BALLOONS) ×2
BALLOON EUPHORA RX 2.5X12 (BALLOONS) IMPLANT
BALLOON MINITREK RX 2.0X8 (BALLOONS) IMPLANT
BALLOON ~~LOC~~ EMERGE MR 3.0X15 (BALLOONS) IMPLANT
BALLOON ~~LOC~~ EUPHORA RX 3.25X8 (BALLOONS) IMPLANT
CATH INFINITI 5 FR JL3.5 (CATHETERS) ×1 IMPLANT
CATH INFINITI JR4 5F (CATHETERS) ×1 IMPLANT
CATH VISTA GUIDE 6FR XB3 (CATHETERS) ×1 IMPLANT
DEVICE RAD COMP TR BAND LRG (VASCULAR PRODUCTS) ×2 IMPLANT
GLIDESHEATH SLEND A-KIT 6F 22G (SHEATH) ×1 IMPLANT
KIT ENCORE 26 ADVANTAGE (KITS) ×1 IMPLANT
KIT HEART LEFT (KITS) ×2 IMPLANT
PACK CARDIAC CATHETERIZATION (CUSTOM PROCEDURE TRAY) ×2 IMPLANT
STENT PROMUS PREM MR 2.75X16 (Permanent Stent) ×1 IMPLANT
TRANSDUCER W/STOPCOCK (MISCELLANEOUS) ×2 IMPLANT
TUBING CIL FLEX 10 FLL-RA (TUBING) ×2 IMPLANT
WIRE ASAHI PROWATER 180CM (WIRE) ×1 IMPLANT
WIRE HI TORQ VERSACORE-J 145CM (WIRE) ×1 IMPLANT
WIRE RUNTHROUGH .014X180CM (WIRE) ×1 IMPLANT
WIRE SAFE-T 1.5MM-J .035X260CM (WIRE) ×1 IMPLANT

## 2015-08-11 NOTE — Progress Notes (Signed)
TR BAND REMOVAL  LOCATION:    right radial  DEFLATED PER PROTOCOL:    Yes.    TIME BAND OFF / DRESSING APPLIED:    1300   SITE UPON ARRIVAL:    Level 0  SITE AFTER BAND REMOVAL:    Level 0  CIRCULATION SENSATION AND MOVEMENT:    Within Normal Limits   Yes.    COMMENTS:   Tolerated procedure well . Post TRB instructions given

## 2015-08-11 NOTE — Interval H&P Note (Signed)
Cath Lab Visit (complete for each Cath Lab visit)  Clinical Evaluation Leading to the Procedure:   ACS: No.  Non-ACS:    Anginal Classification: CCS III  Anti-ischemic medical therapy: Maximal Therapy (2 or more classes of medications)  Non-Invasive Test Results: No non-invasive testing performed  Prior CABG: No previous CABG      History and Physical Interval Note:  08/11/2015 7:33 AM  Darlene Rivera  has presented today for surgery, with the diagnosis of cp  The various methods of treatment have been discussed with the patient and family. After consideration of risks, benefits and other options for treatment, the patient has consented to  Procedure(s): Left Heart Cath and Coronary Angiography (N/A) as a surgical intervention .  The patient's history has been reviewed, patient examined, no change in status, stable for surgery.  I have reviewed the patient's chart and labs.  Questions were answered to the patient's satisfaction.     Lesleigh Noe

## 2015-08-11 NOTE — Research (Signed)
TWILIGHT  Informed Consent   Subject Name: Darlene Rivera  Subject met inclusion and exclusion criteria.  The informed consent form, study requirements and expectations were reviewed with the subject and questions and concerns were addressed prior to the signing of the consent form.  The subject verbalized understanding of the trail requirements.  The subject agreed to participate in the TWILIGHT  trial and signed the informed consent.  The informed consent was obtained prior to performance of any protocol-specific procedures for the subject.  A copy of the signed informed consent was given to the subject and a copy was placed in the subject's medical record.  Hedrick,Dewaine Morocho W 08/11/2015, 14:15

## 2015-08-11 NOTE — H&P (View-Only) (Signed)
Cardiology Office Note   Date:  08/08/2015   ID:  Darlene Rivera, DOB October 14, 1950, MRN [id number]  PCP:  Laurena Slimmer, MD  Cardiologist:  Dr. Verdis Prime Chief Complaint: Chest pain    History of Present Illness: Darlene Rivera is a 65 y.o. female who presents for follow-up of chest pain and exercise Myoview. I saw her on 06/27/15 at which time she was complaining of worsening dyspnea on exertion a little chest tightness. She has history of CAD status post DES to the circumflex in 2011 with moderate residual LAD disease. She also has hypertension, hyperlipidemia, DM with most recent hemoglobin A1c 8.1. Abdominal aortic ultrasound was negative for AAA. I added Imdur to her medications.  Nuclear stress test showed EF to be 79% with no ST changes during stress there was a small defect of moderate severity present in the mid inferolateral location consistent with prior infarct in the circumflex territory. There was another small defect of moderate severity present in the anterior apical and apex location consistent with ischemia in the LAD territory. This was read as a low risk study. She is brought back today to discuss possible cardiac catheterization. Carotid Dopplers were also performed and stable. See below for details.  Occasionally having chest tightness at rest, 1-2/ week lasting 1-2 minutes eases spontaneously.She has associated belching which relieves it also. She thinks it's acid reflux. Not associated with any food. Dyspnea on exertion like walking to her office associated with chest tightness using with rest. She has not used nitroglycerin. The Imdur has definitely helped but she still has to stop going up stairs or on her way into the office to get relief. Discussed with Dr. Katrinka Blazing who recommends cardiac catheterization. Patient is agreeable.      Past Medical History  Diagnosis Date  . Diabetes mellitus without complication (HCC)   . Hypertension   . Chest pain   . GERD  (gastroesophageal reflux disease)   . Rheumatoid arthritis(714.0)   . Hyperlipidemia     Past Surgical History  Procedure Laterality Date  . Coronary stent placement    . Gastric bypass    . Carpal tunnel release       Current Outpatient Prescriptions  Medication Sig Dispense Refill  . aspirin 325 MG tablet Take 325 mg by mouth daily. Reported on 06/27/2015    . Calcium Carbonate-Vitamin D (CALCIUM-VITAMIN D) 500-200 MG-UNIT per tablet Take 1 tablet by mouth daily.    . cholecalciferol (VITAMIN D) 1000 UNITS tablet Take 2,000 Units by mouth daily.    . Cyanocobalamin (VITAMIN B-12 PO) Take 1 tablet by mouth daily.    . ferrous fumarate (HEMOCYTE - 106 MG FE) 325 (106 FE) MG TABS tablet Take 1 tablet by mouth daily before lunch.    . fluocinonide cream (LIDEX) 0.05 % as needed.    . isosorbide mononitrate (IMDUR) 30 MG 24 hr tablet Take 1 tablet (30 mg total) by mouth daily. 90 tablet 3  . lisinopril-hydrochlorothiazide (PRINZIDE,ZESTORETIC) 20-12.5 MG per tablet Take 1 tablet by mouth daily.    . metoprolol tartrate (LOPRESSOR) 25 MG tablet TAKE 1 TABLET BY MOUTH TWICE A DAY. PLEASE SCHEDULE APPOINTMENT 180 tablet 0  . Multiple Vitamin (MULTIVITAMIN) capsule Take 1 capsule by mouth daily.    Marland Kitchen NOVOLOG MIX 70/30 FLEXPEN (70-30) 100 UNIT/ML Pen 2 (two) times daily.  99  . Omega-3 Fatty Acids (FISH OIL) 1000 MG CAPS Take 2,000 mg by mouth daily.    . simvastatin (ZOCOR) 20 MG tablet  TAKE 1 TABLET BY MOUTH EVERY EVENING 30 tablet 9  . SitaGLIPtin-MetFORMIN HCl (JANUMET XR) 50-1000 MG TB24 Take by mouth daily.    . nitroGLYCERIN (NITROSTAT) 0.4 MG SL tablet Place 0.4 mg under the tongue every 5 (five) minutes as needed for chest pain. Reported on 08/08/2015     No current facility-administered medications for this visit.    Allergies:   Sulfa antibiotics    Social History:  The patient  reports that she has never smoked. She does not have any smokeless tobacco history on file. She  reports that she does not drink alcohol or use illicit drugs.   Family History:  The patient's    family history includes Diabetes in her brother and mother; Hypertension in her mother and sister.    ROS:  Please see the history of present illness.   Otherwise, review of systems are positive for none.   All other systems are reviewed and negative.    PHYSICAL EXAM: VS:  BP 122/70 mmHg  Pulse 81  Ht 5\' 6"  (1.676 m)  Wt 222 lb (100.699 kg)  BMI 35.85 kg/m2  SpO2 99% , BMI Body mass index is 35.85 kg/(m^2). GEN: Well nourished, well developed, in no acute distress Neck: no JVD, HJR, carotid bruits, or masses Cardiac:  RRR; as of S4, 2/6 systolic murmur at the left sternal border, no rubs, thrill or heave,  Respiratory:  clear to auscultation bilaterally, normal work of breathing GI: soft, nontender, nondistended, + BS MS: no deformity or atrophy Extremities: without cyanosis, clubbing, edema, good distal pulses bilaterally.  Skin: warm and dry, no rash Neuro:  Strength and sensation are intact    EKG:  EKG is ordered today. The ekg ordered today demonstrates  normal sinus rhythm with nonspecific ST-T wave changes, no acute change   Recent Labs: No results found for requested labs within last 365 days.    Lipid Panel No results found for: CHOL, TRIG, HDL, CHOLHDL, VLDL, LDLCALC, LDLDIRECT    Wt Readings from Last 3 Encounters:  08/08/15 222 lb (100.699 kg)  07/14/15 220 lb (99.791 kg)  06/27/15 220 lb (99.791 kg)      Other studies Reviewed: Additional studies/ records that were reviewed today include and review of the records demonstrates:    Nuclear stress EF: 79%.  The left ventricular ejection fraction is hyperdynamic (>65%).  There was no ST segment deviation noted during stress.  No T wave inversion was noted during stress.  Defect 1: There is a small defect of moderate severity present in the mid inferolateral location. Consistent with prior infarct in the  left circumflex artery territory.  Defect 2: There is a small defect of moderate severity present in the apical anterior and apex location. Consistent with ischemia in the left anterior descending artery territory.  This is a low risk study.   1-39% right ICA stenosis.  40-59% left ICA stenosis Carotids ok. Repeat in 1 yr  ASSESSMENT AND PLAN:  Chest pain Patient has exertional dyspnea and chest pain that has improved with Imdur. Nuclear stress test shows a small defect of moderate severity in the apical anterior and apex location consistent with ischemia in the LAD territory. She had moderate LAD disease at cath in 2011. Dr. Katrinka Blazing recommends cardiac catheterization and possible PCI. Patient is agreeable. We'll arrange for this Friday. I have reviewed the risks, indications, and alternatives to angioplasty and stenting with the patient. Risks include but are not limited to bleeding, infection, vascular injury,  stroke, myocardial infection, arrhythmia, kidney injury, radiation-related injury in the case of prolonged fluoroscopy use, emergency cardiac surgery, and death. The patient understands the risks of serious complication is low (<1%) and he agrees to proceed.     Hypertension Blood Pressure is controlled  Coronary atherosclerosis of native coronary artery Patient had DES to the circumflex in 2011 with residual LAD and RCA disease. Now with chest pain and abnormal nuclear stress test. Proceed with cardiac catheterization.  Diabetes mellitus with complication Patient's most recent hemoglobin A1c was 8.1. She says her sugars are difficult to control and she has gained weight. She no she needs to exercise but will have to wait until after her cardiac catheterization.  Bilateral carotid bruits Recent carotid Dopplers were reviewed and stable. 1-39% right ICA and 40-59% left ICA will need to repeat in 1 year.  Hyperlipidemia Continue Zocor. Will check fasting lipid  panel    Signed, Jacolyn Reedy, PA-C  08/08/2015 8:59 AM    [facility] [address] [place], [place], [place]  [address] Phone: [phone]; Fax: [phone]

## 2015-08-11 NOTE — Care Management Note (Signed)
Case Management Note  Patient Details  Name: FELINA TELLO MRN: [id number] Date of Birth: 10/29/1950  Subjective/Objective:      Patient is from home, she is independent, she will be participating in the [facility]. No other needs.              Action/Plan:   Expected Discharge Date:                  Expected Discharge Plan:  Home/Self Care  In-House Referral:     Discharge planning Services  CM Consult  Post Acute Care Choice:    Choice offered to:     DME Arranged:    DME Agency:     HH Arranged:    HH Agency:     Status of Service:  Completed, signed off  Medicare Important Message Given:    Date Medicare IM Given:    Medicare IM give by:    Date Additional Medicare IM Given:    Additional Medicare Important Message give by:     If discussed at Long Length of Stay Meetings, dates discussed:    Additional Comments:  Leone Haven, RN 08/11/2015, 11:08 AM

## 2015-08-12 ENCOUNTER — Encounter (HOSPITAL_COMMUNITY): Payer: Self-pay | Admitting: Interventional Cardiology

## 2015-08-12 DIAGNOSIS — I209 Angina pectoris, unspecified: Secondary | ICD-10-CM

## 2015-08-12 DIAGNOSIS — I25118 Atherosclerotic heart disease of native coronary artery with other forms of angina pectoris: Secondary | ICD-10-CM | POA: Diagnosis not present

## 2015-08-12 DIAGNOSIS — E669 Obesity, unspecified: Secondary | ICD-10-CM | POA: Diagnosis present

## 2015-08-12 LAB — BASIC METABOLIC PANEL
Anion gap: 7 (ref 5–15)
BUN: 15 mg/dL (ref 6–20)
CO2: 22 mmol/L (ref 22–32)
Calcium: 9.2 mg/dL (ref 8.9–10.3)
Chloride: 111 mmol/L (ref 101–111)
Creatinine, Ser: 0.87 mg/dL (ref 0.44–1.00)
GFR calc Af Amer: >60 mL/min (ref >60)
GFR calc non Af Amer: >60 mL/min (ref >60)
Glucose, Bld: 159 mg/dL — ABNORMAL HIGH (ref 65–99)
Potassium: 4.9 mmol/L (ref 3.5–5.1)
Sodium: 140 mmol/L (ref 135–145)

## 2015-08-12 LAB — CBC
HCT: 36.7 % (ref 36.0–46.0)
Hemoglobin: 12.1 g/dL (ref 12.0–15.0)
MCH: 28.7 pg (ref 26.0–34.0)
MCHC: 33 g/dL (ref 30.0–36.0)
MCV: 87.2 fL (ref 78.0–100.0)
Platelets: 191 10*3/uL (ref 150–400)
RBC: 4.21 MIL/uL (ref 3.87–5.11)
RDW: 13.2 % (ref 11.5–15.5)
WBC: 10.5 10*3/uL (ref 4.0–10.5)

## 2015-08-12 LAB — GLUCOSE, CAPILLARY: Glucose-Capillary: 140 mg/dL — ABNORMAL HIGH (ref 65–99)

## 2015-08-12 MED ORDER — TICAGRELOR 90 MG PO TABS: 90.0000 mg | ORAL_TABLET | Freq: Two times a day (BID) | ORAL | Status: DC

## 2015-08-12 MED ORDER — ASPIRIN 81 MG PO CHEW: 81.0000 mg | CHEWABLE_TABLET | Freq: Every day | ORAL | Status: DC

## 2015-08-12 NOTE — Progress Notes (Signed)
Discharge Summary    Patient ID: Darlene Rivera,  MRN: [id number], DOB/AGE: 1950/08/28 65 y.o.  Admit date: 08/11/2015 Discharge date: 08/12/2015  Primary Care Provider: Laurena Slimmer Primary Cardiologist:  Katrinka Blazing  Discharge Diagnoses    Active Problems:   Diabetes mellitus with complication [facility])   Hyperlipidemia   Coronary atherosclerosis of native coronary artery   Bilateral carotid bruits   Angina pectoris (HCC)   CAD (coronary artery disease), native coronary artery   Obesity (BMI 35.0-39.9 without comorbidity) (HCC)   Allergies Allergies  Allergen Reactions  . Sulfa Antibiotics Nausea Only    Diagnostic Studies/Procedures    Procedures    Coronary Stent Intervention   Left Heart Cath and Coronary Angiography    Conclusion    1. Prox RCA to Dist RCA lesion, 30% stenosed. 2. Dist RCA lesion, 60% stenosed. 3. Ost Cx to Mid Cx lesion, 40% stenosed. The lesion was previously treated with a stent (unknown type). 4. Mid LAD to Dist LAD lesion, 25% stenosed. 5. Prox LAD lesion, 85% stenosed. Post intervention, there is a 15% residual stenosis. 6. Ost 1st Diag lesion, 75% stenosed. Post intervention, there is a 0% residual stenosis.   Continued patency in the proximal circumflex stent.  De novo eccentric 85% proximal/mid LAD and 75% diagonal bifurcation stenosis, [place] 1, 1, 0.  Native right coronary is widely patent with luminal irregularities throughout and 50% distal stenosis.  Successful angioplasty and stenting of the LAD - diagonal bifurcation lesion with placement of LAD DES and final angiographic result of 15% LAD stenosis and 0% diagonal stenosis with TIMI grade 3 flow.  Normal left ventricular function   RECOMMENDATIONS:   Anticipate a.m. Discharge  Aspirin 81 mg and Brilinta 90 mg by mouth twice a day. Enrolled in Twilight study.   Diagnostic Diagram         Post-Intervention Diagram          _____________   History of  Present Illness     Darlene Rivera is a 65 y.o. female who presents for follow-up of chest pain and exercise Myoview. I saw her on 06/27/15 at which time she was complaining of worsening dyspnea on exertion a little chest tightness. She has history of CAD status post DES to the circumflex in 2011 with moderate residual LAD disease. She also has hypertension, hyperlipidemia, DM with most recent hemoglobin A1c 8.1. Abdominal aortic ultrasound was negative for AAA. I added Imdur to her medications.  Nuclear stress test showed EF to be 79% with no ST changes during stress there was a small defect of moderate severity present in the mid inferolateral location consistent with prior infarct in the circumflex territory. There was another small defect of moderate severity present in the anterior apical and apex location consistent with ischemia in the LAD territory. This was read as a low risk study. She is brought back today to discuss possible cardiac catheterization. Carotid Dopplers were also performed and stable. See below for details.  Occasionally having chest tightness at rest, 1-2/ week lasting 1-2 minutes eases spontaneously.She has associated belching which relieves it also. She thinks it's acid reflux. Not associated with any food. Dyspnea on exertion like walking to her office associated with chest tightness using with rest. She has not used nitroglycerin. The Imdur has definitely helped but she still has to stop going up stairs or on her way into the office to get relief. Discussed with Dr. Katrinka Blazing who recommends cardiac catheterization. Patient is agreeable.  Hospital Course     Consultants: Cardiac rehabilitation   This was admitted and underwent left heart catheterization revealing Prox RCA to Dist RCA lesion, 30% stenosed.  Dist RCA lesion, 60% stenosed.  Ost Cx to Mid Cx lesion, 40% stenosed. The lesion was previously treated with a stent (unknown type). Mid LAD to Dist LAD lesion, 25%  stenosed. Prox LAD lesion, 85% stenosed.  Ost 1st Diag lesion, 75% stenosed.  She underwent successful stenting to proximal LAD and ostial first diagonal. She was enrolled in the twilight study.  The patient was seen by Dr. Elberta Fortis who felt she was stable for DC home.   _____________  Discharge Vitals Blood pressure 155/53, pulse 85, temperature 98.1 F (36.7 C), temperature source Oral, resp. rate 26, height 5\' 6"  (1.676 m), weight 220 lb 0.3 oz (99.8 kg), SpO2 92 %.  Filed Weights   08/11/15 0531 08/12/15 0556  Weight: 220 lb (99.791 kg) 220 lb 0.3 oz (99.8 kg)    Labs & Radiologic Studies     CBC  Recent Labs  08/12/15 0523  WBC 10.5  HGB 12.1  HCT 36.7  MCV 87.2  PLT 191   Basic Metabolic Panel  Recent Labs  08/12/15 0523  NA 140  K 4.9  CL 111  CO2 22  GLUCOSE 159*  BUN 15  CREATININE 0.87  CALCIUM 9.2    Disposition   Pt is being discharged home today in good condition.  Follow-up Plans & Appointments    Follow-up Information    Follow up with Lesleigh Noe, MD.   Specialty:  Cardiology   Why:  Office Martita Brumm call you with your follow up appointment date and time. The appointment Ryson Bacha most likely be with a nurse practitioner or PA in about 2 weeks.    Contact information:   [address] [place] [address] [phone]      Discharge Instructions    Diet - low sodium heart healthy    Complete by:  As directed      Discharge instructions    Complete by:  As directed   No lifting with right arm for 3 days     Increase activity slowly    Complete by:  As directed            Discharge Medications   Current Discharge Medication List    START taking these medications   Details  aspirin 81 MG chewable tablet Chew 1 tablet (81 mg total) by mouth daily.    ticagrelor (BRILINTA) 90 MG TABS tablet Take 1 tablet (90 mg total) by mouth 2 (two) times daily. Qty: 60 tablet, Refills: 0      CONTINUE these medications which  have NOT CHANGED   Details  Calcium Carbonate-Vitamin D (CALCIUM-VITAMIN D) 500-200 MG-UNIT per tablet Take 1 tablet by mouth daily.    cetirizine (ZYRTEC) 10 MG tablet Take 10 mg by mouth at bedtime. Refills: 3    cholecalciferol (VITAMIN D) 1000 UNITS tablet Take 2,000 Units by mouth daily.    Cyanocobalamin (VITAMIN B-12 PO) Take 1 tablet by mouth daily.    ferrous fumarate (HEMOCYTE - 106 MG FE) 325 (106 FE) MG TABS tablet Take 1 tablet by mouth daily before lunch.    fluocinonide cream (LIDEX) 0.05 % Apply 1 application topically as needed (for itching).     ipratropium (ATROVENT) 0.06 % nasal spray Place 2 sprays into both nostrils daily as needed for rhinitis.    isosorbide  mononitrate (IMDUR) 30 MG 24 hr tablet Take 1 tablet (30 mg total) by mouth daily. Qty: 90 tablet, Refills: 3    lisinopril-hydrochlorothiazide (PRINZIDE,ZESTORETIC) 20-12.5 MG per tablet Take 1 tablet by mouth daily.    metoprolol tartrate (LOPRESSOR) 25 MG tablet TAKE 1 TABLET BY MOUTH TWICE A DAY. PLEASE SCHEDULE APPOINTMENT Qty: 180 tablet, Refills: 0    Multiple Vitamin (MULTIVITAMIN) capsule Take 1 capsule by mouth daily.    NOVOLOG MIX 70/30 FLEXPEN (70-30) 100 UNIT/ML Pen Inject 15-25 Units into the skin 2 (two) times daily. 25 units every morning and 10-15 units every evening Refills: 99    Omega-3 Fatty Acids (FISH OIL) 1000 MG CAPS Take 2,000 mg by mouth daily.    simvastatin (ZOCOR) 20 MG tablet TAKE 1 TABLET BY MOUTH EVERY EVENING Qty: 30 tablet, Refills: 9    SitaGLIPtin-MetFORMIN HCl (JANUMET XR) 50-1000 MG TB24 Take 1 tablet by mouth at bedtime.     mometasone (NASONEX) 50 MCG/ACT nasal spray Place 2 sprays into the nose daily as needed (for allergies).    nitroGLYCERIN (NITROSTAT) 0.4 MG SL tablet Place 0.4 mg under the tongue every 5 (five) minutes as needed for chest pain. Reported on 08/08/2015      STOP taking these medications     aspirin 325 MG tablet          Aspirin  prescribed at discharge?  Yes High Intensity Statin Prescribed? (Lipitor 40-80mg  or Crestor 20-40mg ): Yes Beta Blocker Prescribed? Yes For EF 45% or less, Was ACEI/ARB Prescribed? NA ADP Receptor Inhibitor Prescribed? (i.e. Plavix etc.-Includes Medically Managed Patients): Yes For EF <40%, Aldosterone Inhibitor Prescribed? NA Was EF assessed during THIS hospitalization? Yes Was Cardiac Rehab II ordered? (Included Medically managed Patients): Yes   Outstanding Labs/Studies     Duration of Discharge Encounter   Greater than 30 minutes including physician time.  Burt Ek [facility] 08/12/2015, 9:55 AM   I have seen and examined this patient with Jones Skene.  Agree with above, note added to reflect my findings.  On exam, regular rhythm, no murmurs, lungs clear.    Had cardiac cath yesterday which showed 85% proximal mid LAD and 75% diagonal bifurcation lesion. He gets his successful angioplasty and stenting of the LAD and diagonal bifurcation lesion with 15% LAD stenosis in 0% diagonal stenosis and TIMI-3 flow post procedure. We'll plan for discharge today on aspirin and Brilinta, as well as unchanged home medications.  Naidelin Gugliotta M. Abrie Egloff MD 08/12/2015 11:06 AM

## 2015-08-12 NOTE — Progress Notes (Signed)
SUBJECTIVE: The patient is doing well today.  At this time, she denies chest pain, shortness of breath, or any new concerns.  Feeling well without major complant  . aspirin  81 mg Oral Daily  . calcium-vitamin D  1 tablet Oral Daily  . cholecalciferol  2,000 Units Oral Daily  . Ferrous Fumarate  1 tablet Oral QAC lunch  . fluticasone  1 spray Each Nare Daily  . lisinopril  20 mg Oral Daily   And  . hydrochlorothiazide  12.5 mg Oral Daily  . insulin aspart protamine- aspart  10 Units Subcutaneous QHS  . insulin aspart protamine- aspart  25 Units Subcutaneous q morning - 10a  . loratadine  10 mg Oral Daily  . metoprolol tartrate  25 mg Oral BID  . multivitamin with minerals  1 tablet Oral Daily  . omega-3 acid ethyl esters  1 g Oral BID  . simvastatin  20 mg Oral QPM  . sodium chloride flush  3 mL Intravenous Q12H  . ticagrelor  90 mg Oral BID  . vitamin B-12  100 mcg Oral Daily      OBJECTIVE: Physical Exam: Filed Vitals:   08/11/15 1704 08/11/15 1933 08/11/15 2000 08/12/15 0556  BP: 142/44 108/34  144/53  Pulse: 72 84 80 95  Temp: 98.6 F (37 C) 97.9 F (36.6 C)  98 F (36.7 C)  TempSrc: Oral Oral  Oral  Resp: 18 24 16 23   Height:      Weight:    220 lb 0.3 oz (99.8 kg)  SpO2: 100% 100% 98% 100%    Intake/Output Summary (Last 24 hours) at 08/12/15 0743 Last data filed at 08/11/15 1933  Gross per 24 hour  Intake    880 ml  Output   2700 ml  Net  -1820 ml    Telemetry reveals sinus rhythm  GEN- The patient is well appearing, alert and oriented x 3 today.   Head- normocephalic, atraumatic Eyes-  Sclera clear, conjunctiva pink Ears- hearing intact Oropharynx- clear Neck- supple, no JVP Lymph- no cervical lymphadenopathy Lungs- Clear to ausculation bilaterally, normal work of breathing Heart- Regular rate and rhythm, no murmurs, rubs or gallops, PMI not laterally displaced GI- soft, NT, ND, + BS Extremities- no clubbing, cyanosis, or edema Skin- no rash or  lesion Psych- euthymic mood, full affect Neuro- strength and sensation are intact  LABS: Basic Metabolic Panel:  Recent Labs  08/13/15 0523  NA 140  K 4.9  CL 111  CO2 22  GLUCOSE 159*  BUN 15  CREATININE 0.87  CALCIUM 9.2   Liver Function Tests: No results for input(s): AST, ALT, ALKPHOS, BILITOT, PROT, ALBUMIN in the last 72 hours. No results for input(s): LIPASE, AMYLASE in the last 72 hours. CBC:  Recent Labs  08/12/15 0523  WBC 10.5  HGB 12.1  HCT 36.7  MCV 87.2  PLT 191   Cardiac Enzymes: No results for input(s): CKTOTAL, CKMB, CKMBINDEX, TROPONINI in the last 72 hours. BNP: Invalid input(s): POCBNP D-Dimer: No results for input(s): DDIMER in the last 72 hours. Hemoglobin A1C: No results for input(s): HGBA1C in the last 72 hours. Fasting Lipid Panel: No results for input(s): CHOL, HDL, LDLCALC, TRIG, CHOLHDL, LDLDIRECT in the last 72 hours. Thyroid Function Tests: No results for input(s): TSH, T4TOTAL, T3FREE, THYROIDAB in the last 72 hours.  Invalid input(s): FREET3 Anemia Panel: No results for input(s): VITAMINB12, FOLATE, FERRITIN, TIBC, IRON, RETICCTPCT in the last 72 hours.  RADIOLOGY: No results found.  ASSESSMENT AND PLAN:  Active Problems:   Diabetes mellitus with complication (HCC)   Hyperlipidemia   Coronary atherosclerosis of native coronary artery   Bilateral carotid bruits   Angina pectoris (HCC)   CAD (coronary artery disease), native coronary artery Had DES placed to LAD with 15% residual stenosis in LAD and 0% stenosis in diagonal branch.  [facility] plan for discharge today as having no further issues.  Discharge on aspirin and Brilinta.  Have discussed avoidance of other NSAIDS with the patient.  Madalynne Gutmann Jorja Loa, MD 08/12/2015 7:43 AM

## 2015-08-12 NOTE — Progress Notes (Signed)
CARDIAC REHAB PHASE I   PRE:  Rate/Rhythm: 77 sinus rhythm  BP:  Supine:    Sitting: 167/49  Standing:    SaO2: 100% ra  MODE:  Ambulation: 300 ft   POST:  Rate/Rhythem: 94 sinus rhythm  BP:  Supine:   Sitting: 162/69  Standing:    SaO2: 100% ra  [phone]  Pt ambulated in hallway x 1 assist, brisk, steady gait.  Pt returned to chair. Pt walking in room independently.  Education complete including diet, exercise, risk factors, medication compliance, NTG use, when to call MD and cardiac rehab.  Referral will be sent to [place].  Pt verbalized understanding.  [place]

## 2015-08-14 ENCOUNTER — Other Ambulatory Visit: Payer: Self-pay | Admitting: *Deleted

## 2015-08-14 MED ORDER — AMBULATORY NON FORMULARY MEDICATION: 90.0000 mg | Freq: Two times a day (BID) | Status: DC

## 2015-08-14 MED ORDER — AMBULATORY NON FORMULARY MEDICATION: 81.0000 mg | Freq: Every day | Status: DC

## 2015-08-16 ENCOUNTER — Ambulatory Visit (INDEPENDENT_AMBULATORY_CARE_PROVIDER_SITE_OTHER): Payer: Managed Care, Other (non HMO) | Admitting: Nurse Practitioner

## 2015-08-16 ENCOUNTER — Encounter: Payer: Self-pay | Admitting: Nurse Practitioner

## 2015-08-16 VITALS — BP 140/68 | HR 88 | Ht 66.0 in | Wt 220.8 lb

## 2015-08-16 DIAGNOSIS — I10 Essential (primary) hypertension: Secondary | ICD-10-CM | POA: Diagnosis not present

## 2015-08-16 DIAGNOSIS — E785 Hyperlipidemia, unspecified: Secondary | ICD-10-CM

## 2015-08-16 DIAGNOSIS — Z955 Presence of coronary angioplasty implant and graft: Secondary | ICD-10-CM | POA: Diagnosis not present

## 2015-08-16 LAB — BASIC METABOLIC PANEL
BUN: 25 mg/dL (ref 7–25)
CO2: 24 mmol/L (ref 20–31)
Calcium: 9 mg/dL (ref 8.6–10.4)
Chloride: 107 mmol/L (ref 98–110)
Creat: 0.94 mg/dL (ref 0.50–0.99)
Glucose, Bld: 194 mg/dL — ABNORMAL HIGH (ref 65–99)
Potassium: 5.6 mmol/L — ABNORMAL HIGH (ref 3.5–5.3)
Sodium: 141 mmol/L (ref 135–146)

## 2015-08-16 LAB — CBC
HCT: 34.6 % — ABNORMAL LOW (ref 36.0–46.0)
Hemoglobin: 11.4 g/dL — ABNORMAL LOW (ref 12.0–15.0)
MCH: 28.6 pg (ref 26.0–34.0)
MCHC: 32.9 g/dL (ref 30.0–36.0)
MCV: 86.7 fL (ref 78.0–100.0)
MPV: 11.8 fL (ref 8.6–12.4)
Platelets: 210 10*3/uL (ref 150–400)
RBC: 3.99 MIL/uL (ref 3.87–5.11)
RDW: 13.2 % (ref 11.5–15.5)
WBC: 9.2 10*3/uL (ref 4.0–10.5)

## 2015-08-16 NOTE — Patient Instructions (Addendum)
We will be checking the following labs today - BMET and CBC   Medication Instructions:    Continue with your current medicines.     Testing/Procedures To Be Arranged:  N/A  Follow-Up:   See Dr. Katrinka Blazing in 3 months.     Other Special Instructions:   May return to work  Regular exercise  Think about Weight Watcher's    If you need a refill on your cardiac medications before your next appointment, please call your pharmacy.   Call the [facility] office at [phone] if you have any questions, problems or concerns.

## 2015-08-16 NOTE — Progress Notes (Signed)
CARDIOLOGY OFFICE NOTE  Date:  08/16/2015    Darlene Rivera Date of Birth: 1950/10/14 Medical Record #[id number]  PCP:  Laurena Slimmer, MD  Cardiologist:  Katrinka Blazing    Chief Complaint  Patient presents with  . Coronary Artery Disease    Post PCI - seen for Dr. Katrinka Blazing    History of Present Illness: Darlene Rivera is a 65 y.o. female who presents today for a post hospital visit. Seen for Dr. Katrinka Blazing.   She has history of CAD status post DES to the circumflex in 2011 with moderate residual LAD disease. She also has hypertension, hyperlipidemia, DM with most recent hemoglobin A1c 8.1. Abdominal aortic ultrasound was negative for AAA.   Presented earlier this year with chest tightness - had Nuclear stress test showed EF to be 79% with no ST changes during stress but there was a small defect of moderate severity present in the mid inferolateral location consistent with prior infarct in the circumflex territory. There was another small defect of moderate severity present in the anterior apical and apex location consistent with ischemia in the LAD territory. This was read as a low risk study. Dr. Katrinka Blazing was concerned about possible LAD ischemia. Carotid Dopplers were also performed and stable.    She was admitted and underwent left heart catheterization revealing Prox RCA to Dist RCA lesion, 30% stenosed. Dist RCA lesion, 60% stenosed. Ost Cx to Mid Cx lesion, 40% stenosed. The lesion was previously treated with a stent (unknown type). Mid LAD to Dist LAD lesion, 25% stenosed. Prox LAD lesion, 85% stenosed. Ost 1st Diag lesion, 75% stenosed. She underwent successful stenting to proximal LAD and ostial first diagonal. She was enrolled in the TWILIGHT study.     Comes back today. Here alone. Feeling better. No more chest/neck/jaw pain. She does have some mild shortness of breath - felt to be related to the Brilinta - but she can tolerate. Not really interested in cardiac rehab -  current insurance not as good as she has had in the past and she knows what she needs to do - just needs "to do it". She is happy with how she is feeling. Would like to return to work on Monday.   Past Medical History  Diagnosis Date  . Diabetes mellitus without complication (HCC)   . Hypertension   . Chest pain   . GERD (gastroesophageal reflux disease)   . Rheumatoid arthritis(714.0)   . Hyperlipidemia     Past Surgical History  Procedure Laterality Date  . Roux-en-y gastric bypass  2002  . Carpal tunnel release Right   . Coronary angioplasty with stent placement  2011    DES to the circumflex /notes 08/08/2015  . Tonsillectomy    . Neuroplasty / transposition median nerve at carpal tunnel Left   . Cardiac catheterization N/A 08/11/2015    Procedure: Left Heart Cath and Coronary Angiography;  Surgeon: Lyn Records, MD;  Location: [facility] INVASIVE CV LAB;  Service: Cardiovascular;  Laterality: N/A;  . Cardiac catheterization N/A 08/11/2015    Procedure: Coronary Stent Intervention;  Surgeon: Lyn Records, MD;  Location: [facility] INVASIVE CV LAB;  Service: Cardiovascular;  Laterality: N/A;     Medications: Current Outpatient Prescriptions  Medication Sig Dispense Refill  . AMBULATORY NON FORMULARY MEDICATION Take 90 mg by mouth 2 (two) times daily. Medication Name: Brilinta 90 mg BID (TWILIGHT Research study PROVIDED)    . AMBULATORY NON FORMULARY MEDICATION Take 81 mg by mouth daily.  Medication Name: ASA 81 mg Daily (TWILIGHT research study provided)    . Calcium Carbonate-Vitamin D (CALCIUM-VITAMIN D) 500-200 MG-UNIT per tablet Take 1 tablet by mouth daily.    . cetirizine (ZYRTEC) 10 MG tablet Take 10 mg by mouth at bedtime.  3  . cholecalciferol (VITAMIN D) 1000 UNITS tablet Take 2,000 Units by mouth daily.    . Cyanocobalamin (VITAMIN B-12 PO) Take 1 tablet by mouth daily.    . ferrous fumarate (HEMOCYTE - 106 MG FE) 325 (106 FE) MG TABS tablet Take 1 tablet by mouth daily before lunch.     . fluocinonide cream (LIDEX) 0.05 % Apply 1 application topically as needed (for itching).     Marland Kitchen ipratropium (ATROVENT) 0.06 % nasal spray Place 2 sprays into both nostrils daily as needed for rhinitis.    Marland Kitchen isosorbide mononitrate (IMDUR) 30 MG 24 hr tablet Take 1 tablet (30 mg total) by mouth daily. 90 tablet 3  . lisinopril-hydrochlorothiazide (PRINZIDE,ZESTORETIC) 20-12.5 MG per tablet Take 1 tablet by mouth daily.    . metoprolol tartrate (LOPRESSOR) 25 MG tablet TAKE 1 TABLET BY MOUTH TWICE A DAY. PLEASE SCHEDULE APPOINTMENT 180 tablet 0  . mometasone (NASONEX) 50 MCG/ACT nasal spray Place 2 sprays into the nose daily as needed (for allergies).    . Multiple Vitamin (MULTIVITAMIN) capsule Take 1 capsule by mouth daily.    . nitroGLYCERIN (NITROSTAT) 0.4 MG SL tablet Place 0.4 mg under the tongue every 5 (five) minutes as needed for chest pain. Reported on 08/08/2015    . NOVOLOG MIX 70/30 FLEXPEN (70-30) 100 UNIT/ML Pen Inject 15-25 Units into the skin 2 (two) times daily. 25 units every morning and 10-15 units every evening  99  . Omega-3 Fatty Acids (FISH OIL) 1000 MG CAPS Take 2,000 mg by mouth daily.    . simvastatin (ZOCOR) 20 MG tablet TAKE 1 TABLET BY MOUTH EVERY EVENING 30 tablet 9  . SitaGLIPtin-MetFORMIN HCl (JANUMET XR) 50-1000 MG TB24 Take 1 tablet by mouth at bedtime.      No current facility-administered medications for this visit.    Allergies: Allergies  Allergen Reactions  . Sulfa Antibiotics Nausea Only    Social History: The patient  reports that she has never smoked. She does not have any smokeless tobacco history on file. She reports that she does not drink alcohol or use illicit drugs.   Family History: The patient's family history includes Diabetes in her brother and mother; Hypertension in her mother and sister.   Review of Systems: Please see the history of present illness.   Otherwise, the review of systems is positive for none.   All other systems are  reviewed and negative.   Physical Exam: VS:  BP 140/68 mmHg  Pulse 88  Ht 5\' 6"  (1.676 m)  Wt 220 lb 12.8 oz (100.154 kg)  BMI 35.65 kg/m2 .  BMI Body mass index is 35.65 kg/(m^2).  Wt Readings from Last 3 Encounters:  08/16/15 220 lb 12.8 oz (100.154 kg)  08/12/15 220 lb 0.3 oz (99.8 kg)  08/08/15 222 lb (100.699 kg)    General: Pleasant. Well developed, well nourished and in no acute distress. She is obese.   HEENT: Normal. Neck: Supple, no JVD, carotid bruits, or masses noted.  Cardiac: Regular rate and rhythm. No murmurs, rubs, or gallops. No edema.  Respiratory:  Lungs are clear to auscultation bilaterally with normal work of breathing.  GI: Soft and nontender.  MS: No deformity or atrophy. Gait  and ROM intact. Skin: Warm and dry. Color is normal.  Neuro:  Strength and sensation are intact and no gross focal deficits noted.  Psych: Alert, appropriate and with normal affect. Right wrist cath site is ok.    LABORATORY DATA:  EKG:  EKG is not ordered today.  Lab Results  Component Value Date   WBC 10.5 08/12/2015   HGB 12.1 08/12/2015   HCT 36.7 08/12/2015   PLT 191 08/12/2015   GLUCOSE 159* 08/12/2015   NA 140 08/12/2015   K 4.9 08/12/2015   CL 111 08/12/2015   CREATININE 0.87 08/12/2015   BUN 15 08/12/2015   CO2 22 08/12/2015   INR 0.95 08/08/2015    BNP (last 3 results) No results for input(s): BNP in the last 8760 hours.  ProBNP (last 3 results) No results for input(s): PROBNP in the last 8760 hours.   Other Studies Reviewed Today: Coronary Stent Intervention    Left Heart Cath and Coronary Angiography    Conclusion    1. Prox RCA to Dist RCA lesion, 30% stenosed. 2. Dist RCA lesion, 60% stenosed. 3. Ost Cx to Mid Cx lesion, 40% stenosed. The lesion was previously treated with a stent (unknown type). 4. Mid LAD to Dist LAD lesion, 25% stenosed. 5. Prox LAD lesion, 85% stenosed. Post intervention, there is a 15% residual stenosis. 6. Ost 1st  Diag lesion, 75% stenosed. Post intervention, there is a 0% residual stenosis.   Continued patency in the proximal circumflex stent.  De novo eccentric 85% proximal/mid LAD and 75% diagonal bifurcation stenosis, [place] 1, 1, 0.  Native right coronary is widely patent with luminal irregularities throughout and 50% distal stenosis.  Successful angioplasty and stenting of the LAD - diagonal bifurcation lesion with placement of LAD DES and angioplasty of diagonal with final angiographic result of 15% LAD stenosis and 0% diagonal stenosis with TIMI grade 3 flow.  Normal left ventricular function   RECOMMENDATIONS:   Anticipate a.m. Discharge  Aspirin 81 mg and Brilinta 90 mg by mouth twice a day. Enrolled in Twilight study.  Needs f/u with HS in 6 weeks and APP in 1 week.     Assessment/Plan: 1. CAD - with prior PCIs and now s/p PCI with stent to the LAD and angioplasty of the diagonal. She is doing well. She is on DAPT thru the TWILIGHT study. Encouraged CV risk factor modification.   2. HTN - BP fair - encouraged her to check at home.   3. Obesity - discussed at length - she admits to eating too many carbs.   4. DM  5. HLD - on statin  Current medicines are reviewed with the patient today.  The patient does not have concerns regarding medicines other than what has been noted above.  The following changes have been made:  See above.  Labs/ tests ordered today include:    Orders Placed This Encounter  Procedures  . Basic metabolic panel  . CBC     Disposition:   FU with Dr. Katrinka Blazing in 3 months.    Patient is agreeable to this plan and will call if any problems develop in the interim.   Signed: Rosalio Macadamia, RN, ANP-C 08/16/2015 10:27 AM  [facility] [address] [place], [place]  [address] Phone: [phone] Fax: [phone]

## 2015-08-17 ENCOUNTER — Other Ambulatory Visit: Payer: Self-pay | Admitting: *Deleted

## 2015-08-17 DIAGNOSIS — E875 Hyperkalemia: Secondary | ICD-10-CM

## 2015-08-22 ENCOUNTER — Other Ambulatory Visit: Payer: Self-pay | Admitting: Interventional Cardiology

## 2015-08-29 ENCOUNTER — Telehealth: Payer: Self-pay | Admitting: Pediatrics

## 2015-08-29 NOTE — Telephone Encounter (Signed)
Darlene Rivera, Just an Lorain Childes that this patient called in to pay off on the remaining balance of her account in MM# [phone]. She received letter asking her to pay off at the 20% discount. She paid $576.81 and I applied to her MM account. Not sure what I should do to bring her down to a $0 balance. ? ~Angela

## 2015-08-30 ENCOUNTER — Other Ambulatory Visit (INDEPENDENT_AMBULATORY_CARE_PROVIDER_SITE_OTHER): Payer: Managed Care, Other (non HMO) | Admitting: *Deleted

## 2015-08-30 DIAGNOSIS — E875 Hyperkalemia: Secondary | ICD-10-CM

## 2015-08-30 LAB — BASIC METABOLIC PANEL
BUN: 16 mg/dL (ref 7–25)
CO2: 19 mmol/L — ABNORMAL LOW (ref 20–31)
Calcium: 9 mg/dL (ref 8.6–10.4)
Chloride: 109 mmol/L (ref 98–110)
Creat: 0.95 mg/dL (ref 0.50–0.99)
Glucose, Bld: 125 mg/dL — ABNORMAL HIGH (ref 65–99)
Potassium: 4.7 mmol/L (ref 3.5–5.3)
Sodium: 140 mmol/L (ref 135–146)

## 2015-08-30 NOTE — Addendum Note (Signed)
Addended by: Tonita Phoenix on: 08/30/2015 07:35 AM   Modules accepted: Orders

## 2015-09-05 NOTE — Telephone Encounter (Signed)
Made adjustment on MM

## 2015-09-14 ENCOUNTER — Telehealth: Payer: Self-pay | Admitting: *Deleted

## 2015-09-14 NOTE — Telephone Encounter (Signed)
TWILIGHT Research study 1 month telephone follow up unsuccessful, Left message for pt to return call to Research office.

## 2015-09-15 ENCOUNTER — Encounter: Payer: Self-pay | Admitting: *Deleted

## 2015-09-15 DIAGNOSIS — Z006 Encounter for examination for normal comparison and control in clinical research program: Secondary | ICD-10-CM

## 2015-09-19 NOTE — Progress Notes (Signed)
TWILIGHT Research 1 month telephone visit completed. Patient return my call and stated she has not had any adverse or bleeding events. States compliant with brilinta and ASA. Questions encouraged and answered. Left another Phone message on 09/19/15 for patient to call back for next study visit, wanting to latch on upcoming visit with Dr. Katrinka Blazing @ [address].

## 2015-10-27 ENCOUNTER — Other Ambulatory Visit: Payer: Self-pay | Admitting: Interventional Cardiology

## 2015-10-27 ENCOUNTER — Other Ambulatory Visit: Payer: Self-pay

## 2015-10-27 MED ORDER — SIMVASTATIN 20 MG PO TABS: 20.0000 mg | ORAL_TABLET | Freq: Every evening | ORAL | Status: DC

## 2015-10-29 DIAGNOSIS — I739 Peripheral vascular disease, unspecified: Secondary | ICD-10-CM

## 2015-10-29 DIAGNOSIS — I779 Disorder of arteries and arterioles, unspecified: Secondary | ICD-10-CM | POA: Insufficient documentation

## 2015-10-30 ENCOUNTER — Other Ambulatory Visit: Payer: Self-pay | Admitting: *Deleted

## 2015-10-30 ENCOUNTER — Ambulatory Visit (INDEPENDENT_AMBULATORY_CARE_PROVIDER_SITE_OTHER): Payer: Managed Care, Other (non HMO) | Admitting: Interventional Cardiology

## 2015-10-30 ENCOUNTER — Encounter: Payer: Self-pay | Admitting: *Deleted

## 2015-10-30 ENCOUNTER — Encounter: Payer: Self-pay | Admitting: Interventional Cardiology

## 2015-10-30 VITALS — BP 146/60 | HR 100 | Ht 66.0 in | Wt 220.8 lb

## 2015-10-30 DIAGNOSIS — I2510 Atherosclerotic heart disease of native coronary artery without angina pectoris: Secondary | ICD-10-CM | POA: Diagnosis not present

## 2015-10-30 DIAGNOSIS — R0600 Dyspnea, unspecified: Secondary | ICD-10-CM | POA: Diagnosis not present

## 2015-10-30 DIAGNOSIS — Z006 Encounter for examination for normal comparison and control in clinical research program: Secondary | ICD-10-CM

## 2015-10-30 DIAGNOSIS — E785 Hyperlipidemia, unspecified: Secondary | ICD-10-CM | POA: Diagnosis not present

## 2015-10-30 DIAGNOSIS — I10 Essential (primary) hypertension: Secondary | ICD-10-CM | POA: Diagnosis not present

## 2015-10-30 DIAGNOSIS — E118 Type 2 diabetes mellitus with unspecified complications: Secondary | ICD-10-CM

## 2015-10-30 DIAGNOSIS — I6523 Occlusion and stenosis of bilateral carotid arteries: Secondary | ICD-10-CM

## 2015-10-30 MED ORDER — AMBULATORY NON FORMULARY MEDICATION: 81.0000 mg | Freq: Every day | Status: DC

## 2015-10-30 MED ORDER — LISINOPRIL-HYDROCHLOROTHIAZIDE 20-25 MG PO TABS: 1.0000 | ORAL_TABLET | Freq: Every day | ORAL | Status: DC

## 2015-10-30 NOTE — Progress Notes (Signed)
Cardiology Office Note    Date:  10/30/2015   ID:  ILLEANNA MAUZY, DOB 07-Jun-1951, MRN [id number]  PCP:  Laurena Slimmer, MD  Cardiologist: Lesleigh Noe, MD   Chief Complaint  Patient presents with  . Coronary Artery Disease    History of Present Illness:  Darlene Rivera is a 65 y.o. female follow-up of CAD with remote DES and circumflex, recent LAD diagonal bifurcation intervention with provisional stenting of LAD and angioplasty of diagonal with excellent result, hypertension, hyperlipidemia, and diabetes.  Complains of exertional fatigue. Rare tightness in the chest. States that she is using her inhaler because she had times becomes dyspneic. We discussed the potential role of Brilinta in the dyspnea. She has not needed to use nitroglycerin.  Past Medical History  Diagnosis Date  . Diabetes mellitus without complication (HCC)   . Hypertension   . Chest pain   . GERD (gastroesophageal reflux disease)   . Rheumatoid arthritis(714.0)   . Hyperlipidemia     Past Surgical History  Procedure Laterality Date  . Roux-en-y gastric bypass  2002  . Carpal tunnel release Right   . Coronary angioplasty with stent placement  2011    DES to the circumflex /notes 08/08/2015  . Tonsillectomy    . Neuroplasty / transposition median nerve at carpal tunnel Left   . Cardiac catheterization N/A 08/11/2015    Procedure: Left Heart Cath and Coronary Angiography;  Surgeon: Lyn Records, MD;  Location: [facility] INVASIVE CV LAB;  Service: Cardiovascular;  Laterality: N/A;  . Cardiac catheterization N/A 08/11/2015    Procedure: Coronary Stent Intervention;  Surgeon: Lyn Records, MD;  Location: [facility] INVASIVE CV LAB;  Service: Cardiovascular;  Laterality: N/A;    Current Medications: Outpatient Prescriptions Prior to Visit  Medication Sig Dispense Refill  . AMBULATORY NON FORMULARY MEDICATION Take 90 mg by mouth 2 (two) times daily. Medication Name: Brilinta 90 mg BID (TWILIGHT Research  study PROVIDED)    . AMBULATORY NON FORMULARY MEDICATION Take 81 mg by mouth daily. Medication Name: ASA 81 mg Daily (TWILIGHT research study provided)    . Calcium Carbonate-Vitamin D (CALCIUM-VITAMIN D) 500-200 MG-UNIT per tablet Take 1 tablet by mouth daily.    . cetirizine (ZYRTEC) 10 MG tablet Take 10 mg by mouth at bedtime.  3  . cholecalciferol (VITAMIN D) 1000 UNITS tablet Take 2,000 Units by mouth daily.    . Cyanocobalamin (VITAMIN B-12 PO) Take 1 tablet by mouth daily.    . ferrous fumarate (HEMOCYTE - 106 MG FE) 325 (106 FE) MG TABS tablet Take 1 tablet by mouth daily before lunch.    . fluocinonide cream (LIDEX) 0.05 % Apply 1 application topically as needed (for itching).     Marland Kitchen ipratropium (ATROVENT) 0.06 % nasal spray Place 2 sprays into both nostrils daily as needed for rhinitis.    Marland Kitchen isosorbide mononitrate (IMDUR) 30 MG 24 hr tablet Take 1 tablet (30 mg total) by mouth daily. 90 tablet 3  . lisinopril-hydrochlorothiazide (PRINZIDE,ZESTORETIC) 20-12.5 MG per tablet Take 1 tablet by mouth daily.    . metoprolol tartrate (LOPRESSOR) 25 MG tablet Take 1 tablet (25 mg total) by mouth 2 (two) times daily. 180 tablet 3  . mometasone (NASONEX) 50 MCG/ACT nasal spray Place 2 sprays into the nose daily as needed (for allergies).    . Multiple Vitamin (MULTIVITAMIN) capsule Take 1 capsule by mouth daily.    . nitroGLYCERIN (NITROSTAT) 0.4 MG SL tablet Place 0.4 mg under  the tongue every 5 (five) minutes as needed for chest pain. Reported on 08/08/2015    . NOVOLOG MIX 70/30 FLEXPEN (70-30) 100 UNIT/ML Pen Inject 15-25 Units into the skin 2 (two) times daily. 25 units every morning and 10-15 units every evening  99  . Omega-3 Fatty Acids (FISH OIL) 1000 MG CAPS Take 2,000 mg by mouth daily.    . simvastatin (ZOCOR) 20 MG tablet Take 1 tablet (20 mg total) by mouth every evening. 30 tablet 9  . SitaGLIPtin-MetFORMIN HCl (JANUMET XR) 50-1000 MG TB24 Take 1 tablet by mouth at bedtime.     .  simvastatin (ZOCOR) 20 MG tablet TAKE 1 TABLET BY MOUTH EVERY EVENING 30 tablet 9   No facility-administered medications prior to visit.     Allergies:   Sulfa antibiotics   Social History   Social History  . Marital Status: Single    Spouse Name: N/A  . Number of Children: N/A  . Years of Education: N/A   Social History Main Topics  . Smoking status: Never Smoker   . Smokeless tobacco: Never Used  . Alcohol Use: No  . Drug Use: No  . Sexual Activity: Not Asked   Other Topics Concern  . None   Social History Narrative     Family History:  The patient's family history includes Diabetes in her brother and mother; Hypertension in her mother and sister.   ROS:   Please see the history of present illness.  dyspnea, easy bruising, leg swelling but no orthopneall other systems reviewed and are negative.   PHYSICAL EXAM:   VS:  BP 146/60 mmHg  Pulse 100  Ht 5\' 6"  (1.676 m)  Wt 220 lb 12.8 oz (100.154 kg)  BMI 35.65 kg/m2   GEN: Well nourished, well developed, in no acute distress HEENT: normal Neck: no JVD, carotid bruits, or masses Cardiac: RRR; no murmurs, rubs, or gallops,no edema  Respiratory:  clear to auscultation bilaterally, normal work of breathing GI: soft, nontender, nondistended, + BS MS: no deformity or atrophy Skin: warm and dry, no rash Neuro:  Alert and Oriented x 3, Strength and sensation are intact Psych: euthymic mood, full affect  Wt Readings from Last 3 Encounters:  10/30/15 220 lb 12.8 oz (100.154 kg)  08/16/15 220 lb 12.8 oz (100.154 kg)  08/12/15 220 lb 0.3 oz (99.8 kg)      Studies/Labs Reviewed:   EKG: Is not performed today.  Recent Labs: 08/16/2015: Hemoglobin 11.4*; Platelets 210 08/30/2015: BUN 16; Creat 0.95; Potassium 4.7; Sodium 140   Lipid Panel No results found for: CHOL, TRIG, HDL, CHOLHDL, VLDL, LDLCALC, LDLDIRECT  Additional studies/ records that were reviewed today include:  08/11/15: PCI with reduction and de novo LAD  stenosis from 85% to 0% and 75% stenosis in the diagonal decreased to 15% following angioplasty. Post-Intervention Diagram                ASSESSMENT:    1. Atherosclerosis of native coronary artery of native heart without angina pectoris   2. Dyspnea   3. Hyperlipidemia   4. Essential hypertension   5. Carotid stenosis, bilateral   6. Diabetes mellitus with complication (HCC)      PLAN:  In order of problems listed above:  1.  Anginal grade has improved. Current medical regimen seems to be adequate. She is in the [place] trial and is randomized today down to 1 antiplatelet agent. 2.  This could be related to Brilinta, diastolic heart failure, or  her asthma. The blood pressure is still mildly elevated. On the outside chance that dyspnea is diastolic heart failure related, we will increase hydrochlorothiazide to 25 mg per day by changing lisinopril HCT to 20/25 mg daily; 3. The blood pressure should further improve with the uptitrate and of lisinopril HCT.    Medication Adjustments/Labs and Tests Ordered: Current medicines are reviewed at length with the patient today.  Concerns regarding medicines are outlined above.  Medication changes, Labs and Tests ordered today are listed in the Patient Instructions below. Patient Instructions  Medication Instructions:  Your physician has recommended you make the following change in your medication:  INCREASE Lisinopril HCTZ to 20-25mg  daily. An Rx has been sent to your pharmacy  Labwork: Your physician recommends that you return for lab work in: 7-10 days (Cmet, Bnp)  Testing/Procedures: None ordered  Follow-Up: Your physician recommends that you schedule a follow-up appointment in: 4 months   Any Other Special Instructions Will Be Listed Below (If Applicable). Your physician discussed the importance of regular exercise and recommended that you start or continue a regular exercise program for good health.       If you  need a refill on your cardiac medications before your next appointment, please call your pharmacy.       Signed, Lesleigh Noe, MD  10/30/2015 9:15 AM    [facility] [address] [place], [place], [place]  [address] Phone: [phone]; Fax: [phone]

## 2015-10-30 NOTE — Patient Instructions (Signed)
Medication Instructions:  Your physician has recommended you make the following change in your medication:  INCREASE Lisinopril HCTZ to 20-25mg  daily. An Rx has been sent to your pharmacy  Labwork: Your physician recommends that you return for lab work in: 7-10 days (Cmet, Bnp)  Testing/Procedures: None ordered  Follow-Up: Your physician recommends that you schedule a follow-up appointment in: 4 months   Any Other Special Instructions Will Be Listed Below (If Applicable). Your physician discussed the importance of regular exercise and recommended that you start or continue a regular exercise program for good health.       If you need a refill on your cardiac medications before your next appointment, please call your pharmacy.

## 2015-10-30 NOTE — Progress Notes (Signed)
TWILIGHT Research Study month 3 randomization visit completed. Patient states she has missed a few pm doses of Brilinta. Encouraged patient to set alarm on phone to remind her to take that pm dose. She denies any adverse or bleeding events. Instructed that she is Randomized today to either ASA 81 mg or PLACEBO, and to NOT take any open label ASA Verbalized understanding. Questions encouraged and answered. Research follow up window schedule given to patient.

## 2015-11-07 ENCOUNTER — Other Ambulatory Visit (INDEPENDENT_AMBULATORY_CARE_PROVIDER_SITE_OTHER): Payer: Managed Care, Other (non HMO) | Admitting: *Deleted

## 2015-11-07 DIAGNOSIS — I10 Essential (primary) hypertension: Secondary | ICD-10-CM

## 2015-11-07 LAB — COMPREHENSIVE METABOLIC PANEL
ALT: 25 U/L (ref 6–29)
AST: 22 U/L (ref 10–35)
Albumin: 3.9 g/dL (ref 3.6–5.1)
Alkaline Phosphatase: 121 U/L (ref 33–130)
BUN: 19 mg/dL (ref 7–25)
CO2: 22 mmol/L (ref 20–31)
Calcium: 9 mg/dL (ref 8.6–10.4)
Chloride: 109 mmol/L (ref 98–110)
Creat: 0.91 mg/dL (ref 0.50–0.99)
Glucose, Bld: 172 mg/dL — ABNORMAL HIGH (ref 65–99)
Potassium: 5.3 mmol/L (ref 3.5–5.3)
Sodium: 141 mmol/L (ref 135–146)
Total Bilirubin: 0.4 mg/dL (ref 0.2–1.2)
Total Protein: 6.6 g/dL (ref 6.1–8.1)

## 2015-11-07 LAB — BRAIN NATRIURETIC PEPTIDE: Brain Natriuretic Peptide: 53.3 pg/mL (ref <100)

## 2015-12-06 ENCOUNTER — Telehealth: Payer: Self-pay | Admitting: *Deleted

## 2015-12-06 NOTE — Telephone Encounter (Signed)
Left message for patient to call research office for TWILIGHT 4 month Telephone follow up call.

## 2015-12-13 ENCOUNTER — Telehealth: Payer: Self-pay | Admitting: *Deleted

## 2015-12-13 NOTE — Telephone Encounter (Signed)
Left message for patient to return call to research office for 4 month TWILIGHT research study follow up.

## 2015-12-15 ENCOUNTER — Encounter: Payer: Self-pay | Admitting: *Deleted

## 2015-12-15 DIAGNOSIS — Z006 Encounter for examination for normal comparison and control in clinical research program: Secondary | ICD-10-CM

## 2015-12-15 NOTE — Progress Notes (Signed)
TWILIGHT Research study 4 month telephone follow up completed. Patient denies any bleeding or adverse events, and been compliant with research provided medication. She also states she has not taken any ASA other than study provided bottle. Questions encouraged and answered.

## 2016-02-14 ENCOUNTER — Other Ambulatory Visit: Payer: Self-pay

## 2016-03-05 ENCOUNTER — Encounter: Payer: Self-pay | Admitting: Interventional Cardiology

## 2016-03-05 ENCOUNTER — Ambulatory Visit (INDEPENDENT_AMBULATORY_CARE_PROVIDER_SITE_OTHER): Payer: Managed Care, Other (non HMO) | Admitting: Interventional Cardiology

## 2016-03-05 VITALS — BP 154/78 | HR 83 | Ht 66.0 in | Wt 223.0 lb

## 2016-03-05 DIAGNOSIS — I209 Angina pectoris, unspecified: Secondary | ICD-10-CM

## 2016-03-05 DIAGNOSIS — I10 Essential (primary) hypertension: Secondary | ICD-10-CM | POA: Diagnosis not present

## 2016-03-05 DIAGNOSIS — I6523 Occlusion and stenosis of bilateral carotid arteries: Secondary | ICD-10-CM

## 2016-03-05 DIAGNOSIS — I2510 Atherosclerotic heart disease of native coronary artery without angina pectoris: Secondary | ICD-10-CM

## 2016-03-05 MED ORDER — METOPROLOL TARTRATE 50 MG PO TABS: 50.0000 mg | ORAL_TABLET | Freq: Two times a day (BID) | ORAL | 3 refills | Status: DC

## 2016-03-05 NOTE — Progress Notes (Signed)
Cardiology Office Note    Date:  03/05/2016   ID:  Darlene Rivera, DOB Apr 12, 1951, MRN [id number]  PCP:  Laurena Slimmer, MD  Cardiologist: Lesleigh Noe, MD   Chief Complaint  Patient presents with  . Coronary Artery Disease    History of Present Illness:  Darlene Rivera is a 65 y.o. female CAD with remote DES and circumflex, recent LAD diagonal bifurcation intervention with provisional stenting of LAD and angioplasty of diagonal with excellent result, hypertension, hyperlipidemia, and diabetes.  Dyspnea on exertion but no chest discomfort. No nitroglycerin use. Not being physically active. No bleeding on Brilinta.   Past Medical History:  Diagnosis Date  . Chest pain   . Diabetes mellitus without complication (HCC)   . GERD (gastroesophageal reflux disease)   . Hyperlipidemia   . Hypertension   . Rheumatoid arthritis(714.0)     Past Surgical History:  Procedure Laterality Date  . CARDIAC CATHETERIZATION N/A 08/11/2015   Procedure: Left Heart Cath and Coronary Angiography;  Surgeon: Lyn Records, MD;  Location: [facility] INVASIVE CV LAB;  Service: Cardiovascular;  Laterality: N/A;  . CARDIAC CATHETERIZATION N/A 08/11/2015   Procedure: Coronary Stent Intervention;  Surgeon: Lyn Records, MD;  Location: [facility] INVASIVE CV LAB;  Service: Cardiovascular;  Laterality: N/A;  . CARPAL TUNNEL RELEASE Right   . CORONARY ANGIOPLASTY WITH STENT PLACEMENT  2011   DES to the circumflex /notes 08/08/2015  . NEUROPLASTY / TRANSPOSITION MEDIAN NERVE AT CARPAL TUNNEL Left   . ROUX-EN-Y GASTRIC BYPASS  2002  . TONSILLECTOMY      Current Medications: Outpatient Medications Prior to Visit  Medication Sig Dispense Refill  . AMBULATORY NON FORMULARY MEDICATION Take 90 mg by mouth 2 (two) times daily. Medication Name: Brilinta 90 mg BID (TWILIGHT Research study PROVIDED)    . AMBULATORY NON FORMULARY MEDICATION Take 81 mg by mouth daily. Medication Name: ASA 81 mg Daily or PLACEBO  (TWILIGHT Research study provided)    . Calcium Carbonate-Vitamin D (CALCIUM-VITAMIN D) 500-200 MG-UNIT per tablet Take 1 tablet by mouth daily.    . cetirizine (ZYRTEC) 10 MG tablet Take 10 mg by mouth at bedtime.  3  . cholecalciferol (VITAMIN D) 1000 UNITS tablet Take 2,000 Units by mouth daily.    . Cyanocobalamin (VITAMIN B-12 PO) Take 1 tablet by mouth daily.    . ferrous fumarate (HEMOCYTE - 106 MG FE) 325 (106 FE) MG TABS tablet Take 1 tablet by mouth daily before lunch.    Marland Kitchen ipratropium (ATROVENT) 0.06 % nasal spray Place 2 sprays into both nostrils daily as needed for rhinitis.    Marland Kitchen isosorbide mononitrate (IMDUR) 30 MG 24 hr tablet Take 1 tablet (30 mg total) by mouth daily. 90 tablet 3  . lisinopril-hydrochlorothiazide (PRINZIDE,ZESTORETIC) 20-25 MG tablet Take 1 tablet by mouth daily. 30 tablet 11  . metoprolol tartrate (LOPRESSOR) 25 MG tablet Take 1 tablet (25 mg total) by mouth 2 (two) times daily. 180 tablet 3  . mometasone (NASONEX) 50 MCG/ACT nasal spray Place 2 sprays into the nose daily as needed (for allergies).    . Multiple Vitamin (MULTIVITAMIN) capsule Take 1 capsule by mouth daily.    . nitroGLYCERIN (NITROSTAT) 0.4 MG SL tablet Place 0.4 mg under the tongue every 5 (five) minutes as needed for chest pain. Reported on 08/08/2015    . NOVOLOG MIX 70/30 FLEXPEN (70-30) 100 UNIT/ML Pen Inject 15-25 Units into the skin 2 (two) times daily. 25 units every morning and  10-15 units every evening  99  . Omega-3 Fatty Acids (FISH OIL) 1000 MG CAPS Take 2,000 mg by mouth daily.    Marland Kitchen PROAIR RESPICLICK 108 (90 Base) MCG/ACT AEPB Inhale 2 puffs into the lungs every 4 (four) hours as needed. (cough/wheezing)  1  . simvastatin (ZOCOR) 20 MG tablet Take 1 tablet (20 mg total) by mouth every evening. 30 tablet 9  . SitaGLIPtin-MetFORMIN HCl (JANUMET XR) 50-1000 MG TB24 Take 1 tablet by mouth at bedtime.     . fluocinonide cream (LIDEX) 0.05 % Apply 1 application topically as needed (for  itching).      No facility-administered medications prior to visit.      Allergies:   Sulfa antibiotics   Social History   Social History  . Marital status: Single    Spouse name: N/A  . Number of children: N/A  . Years of education: N/A   Social History Main Topics  . Smoking status: Never Smoker  . Smokeless tobacco: Never Used  . Alcohol use No  . Drug use: No  . Sexual activity: Not Asked   Other Topics Concern  . None   Social History Narrative  . None     Family History:  The patient's family history includes Diabetes in her brother and mother; Hypertension in her mother and sister.   ROS:   Please see the history of present illness.    Difficulty with vision. Having cataract surgery soon by Dr. Delaney Meigs. Dyspnea on exertion.  All other systems reviewed and are negative.   PHYSICAL EXAM:   VS:  BP (!) 154/78   Pulse 83   Ht 5\' 6"  (1.676 m)   Wt 223 lb (101.2 kg)   BMI 35.99 kg/m    GEN: Well nourished, well developed, in no acute distress  HEENT: normal  Neck: no JVD, carotid bruits, or masses Cardiac: RRR; no murmurs, rubs, or gallops,no edema  Respiratory:  clear to auscultation bilaterally, normal work of breathing GI: soft, nontender, nondistended, + BS MS: no deformity or atrophy  Skin: warm and dry, no rash Neuro:  Alert and Oriented x 3, Strength and sensation are intact Psych: euthymic mood, full affect  Wt Readings from Last 3 Encounters:  03/05/16 223 lb (101.2 kg)  10/30/15 220 lb 12.8 oz (100.2 kg)  08/16/15 220 lb 12.8 oz (100.2 kg)      Studies/Labs Reviewed:   EKG:  EKG  Not repeated  Recent Labs: 08/16/2015: Hemoglobin 11.4; Platelets 210 11/07/2015: ALT 25; Brain Natriuretic Peptide 53.3; BUN 19; Creat 0.91; Potassium 5.3; Sodium 141   Lipid Panel No results found for: CHOL, TRIG, HDL, CHOLHDL, VLDL, LDLCALC, LDLDIRECT  Additional studies/ records that were reviewed today include:  Cath performed in February  2017:  Continued patency in the proximal circumflex stent.  De novo eccentric 85% proximal/mid LAD and 75% diagonal bifurcation stenosis, [place] 1, 1, 0.  Native right coronary is widely patent with luminal irregularities throughout and 50% distal stenosis.  Successful angioplasty and stenting of the LAD - diagonal bifurcation lesion with placement of LAD DES and angioplasty of diagonal with final angiographic result of 15% LAD stenosis and 0% diagonal stenosis with TIMI grade 3 flow.  Normal left ventricular function   RECOMMENDATIONS:   Anticipate a.m. Discharge  Aspirin 81 mg and Brilinta 90 mg by mouth twice a day. Enrolled in Twilight study.  Needs f/u with HS in 6 weeks and APP in 1 week.   ASSESSMENT:  1. Coronary artery disease involving native coronary artery of native heart without angina pectoris   2. Angina pectoris (HCC)   3. Atherosclerosis of native coronary artery of native heart without angina pectoris   4. Carotid stenosis, bilateral   5. Essential hypertension      PLAN:  In order of problems listed above:  1. Doing well after recent LAD diagonal stent. No recurrence of angina. I have encouraged aerobic activity. 2. No angina. Please see above. Should use nitroglycerin prolonged chest discomfort. Never use nitroglycerin this point. We'll authorize refill so that she can get it if she ever needs it. 3. Not evaluated on this office visit. 4. Blood pressure is too high. I goal is 130/85 mmHg or less. Increase metoprolol tartrate 50 mg twice a day    Medication Adjustments/Labs and Tests Ordered: Current medicines are reviewed at length with the patient today.  Concerns regarding medicines are outlined above.  Medication changes, Labs and Tests ordered today are listed in the Patient Instructions below. There are no Patient Instructions on file for this visit.   Signed, Lesleigh Noe, MD  03/05/2016 8:46 AM    [facility]  [facility] [address] [place], [place], [place]  [address] Phone: [phone]; Fax: [phone]

## 2016-03-05 NOTE — Patient Instructions (Signed)
Medication Instructions:  1) INCREASE Metoprolol Tartrate to 50mg  twice daily  Labwork: None  Testing/Procedures: None  Follow-Up: Your physician wants you to follow-up in: 6 months with Dr. . You will receive a reminder letter in the mail two months in advance. If you don't receive a letter, please call our office to schedule the follow-up appointment.   Any Other Special Instructions Will Be Listed Below (If Applicable).  Your physician would like for you to be more active as discussed at your appointment today.  You are cleared for cataract surgery.   If you need a refill on your cardiac medications before your next appointment, please call your pharmacy.

## 2016-05-07 ENCOUNTER — Encounter: Payer: Self-pay | Admitting: *Deleted

## 2016-05-07 DIAGNOSIS — Z006 Encounter for examination for normal comparison and control in clinical research program: Secondary | ICD-10-CM

## 2016-05-07 NOTE — Progress Notes (Signed)
TWILIGHT Research study month 9 visit completed. Patient denies any bleeding events or other adverse events. She states that she has been compliant with research provided medication. After pill count patient has been compliant 100% with ASA/PLACEBO and 91% with Brilinta. Next research required visit is due no later than 19/JUN/2018. At that appointment she will no longer be provided ASA/PLACEBO or BRILINTA. Further antiplatelet therapy will be at the discretion of her cardiologist. Questions encouraged and answered.

## 2016-05-27 ENCOUNTER — Other Ambulatory Visit: Payer: Self-pay | Admitting: Physician Assistant

## 2016-07-10 ENCOUNTER — Ambulatory Visit (HOSPITAL_COMMUNITY)
Admission: RE | Admit: 2016-07-10 | Discharge: 2016-07-10 | Disposition: A | Discharge status: Home / Self Care | Payer: Managed Care, Other (non HMO) | Source: Ambulatory Visit | Attending: Cardiovascular Disease | Admitting: Cardiovascular Disease

## 2016-07-10 DIAGNOSIS — I6523 Occlusion and stenosis of bilateral carotid arteries: Secondary | ICD-10-CM

## 2016-07-11 ENCOUNTER — Telehealth: Payer: Self-pay | Admitting: Interventional Cardiology

## 2016-07-11 DIAGNOSIS — I6523 Occlusion and stenosis of bilateral carotid arteries: Secondary | ICD-10-CM

## 2016-07-11 NOTE — Telephone Encounter (Signed)
-----   Message from Lyn Records, MD sent at 07/11/2016 12:46 PM EST ----- Let the patient know there is no significant obstruction. The left carotid is most severely involved with up to 60% narrowing. Greater than 80% is considered to be a reason for treatment. We need to simply follow this along. The study should be repeated in one year. A copy will be sent to Laurena Slimmer, MD

## 2016-07-11 NOTE — Telephone Encounter (Signed)
New Message     Returning the call she thinks its about there carotid results?

## 2016-07-11 NOTE — Telephone Encounter (Signed)
Patient made aware of results. Patient verbalizes understanding. Repeat carotid ordered for 1 year.

## 2016-08-27 ENCOUNTER — Encounter: Payer: Self-pay | Admitting: Interventional Cardiology

## 2016-09-01 NOTE — Progress Notes (Signed)
Cardiology Office Note    Date:  09/02/2016   ID:  [name], DOB 25-Apr-1951, MRN [id number]  PCP:  Laurena Slimmer, MD  Cardiologist: Lesleigh Noe, MD   Chief Complaint  Patient presents with  . Coronary Artery Disease    History of Present Illness:  Darlene Rivera is a 66 y.o. female CAD with remote DES in circumflex, recent LAD diagonal bifurcation intervention (07/2015) with provisional stenting of LAD and angioplasty of diagonal with excellent result, hypertension, hyperlipidemia, and diabetes.  She is now one year post LAD bifurcation intervention. Has history of chronic circumflex stent and more recent LAD stent with DES and LAD and angioplasty of diagonal. On the last OV, we decided to increase metoprolol to 50 mg twice a day. She did not do this. She is still taken 25 twice a day. She feels that in our office, heart rate and blood pressure always higher than her typical. She was reluctant to increase the dose. She has not needed to use nitroglycerin.  Past Medical History:  Diagnosis Date  . Chest pain   . Diabetes mellitus without complication (HCC)   . GERD (gastroesophageal reflux disease)   . Hyperlipidemia   . Hypertension   . Rheumatoid arthritis(714.0)     Past Surgical History:  Procedure Laterality Date  . CARDIAC CATHETERIZATION N/A 08/11/2015   Procedure: Left Heart Cath and Coronary Angiography;  Surgeon: Lyn Records, MD;  Location: [facility] INVASIVE CV LAB;  Service: Cardiovascular;  Laterality: N/A;  . CARDIAC CATHETERIZATION N/A 08/11/2015   Procedure: Coronary Stent Intervention;  Surgeon: Lyn Records, MD;  Location: [facility] INVASIVE CV LAB;  Service: Cardiovascular;  Laterality: N/A;  . CARPAL TUNNEL RELEASE Right   . CORONARY ANGIOPLASTY WITH STENT PLACEMENT  2011   DES to the circumflex /notes 08/08/2015  . NEUROPLASTY / TRANSPOSITION MEDIAN NERVE AT CARPAL TUNNEL Left   . ROUX-EN-Y GASTRIC BYPASS  2002  . TONSILLECTOMY      Current  Medications: Outpatient Medications Prior to Visit  Medication Sig Dispense Refill  . AMBULATORY NON FORMULARY MEDICATION Take 90 mg by mouth 2 (two) times daily. Medication Name: Brilinta 90 mg BID (TWILIGHT Research study PROVIDED)    . AMBULATORY NON FORMULARY MEDICATION Take 81 mg by mouth daily. Medication Name: ASA 81 mg Daily or PLACEBO (TWILIGHT Research study provided)    . Calcium Carbonate-Vitamin D (CALCIUM-VITAMIN D) 500-200 MG-UNIT per tablet Take 1 tablet by mouth daily.    . cetirizine (ZYRTEC) 10 MG tablet Take 10 mg by mouth at bedtime.  3  . cholecalciferol (VITAMIN D) 1000 UNITS tablet Take 2,000 Units by mouth daily.    . Cyanocobalamin (VITAMIN B-12 PO) Take 1 tablet by mouth daily.    . ferrous fumarate (HEMOCYTE - 106 MG FE) 325 (106 FE) MG TABS tablet Take 1 tablet by mouth daily before lunch.    Marland Kitchen ipratropium (ATROVENT) 0.06 % nasal spray Place 2 sprays into both nostrils daily as needed for rhinitis.    Marland Kitchen isosorbide mononitrate (IMDUR) 30 MG 24 hr tablet TAKE 1 TABLET (30 MG TOTAL) BY MOUTH DAILY. 90 tablet 3  . lisinopril-hydrochlorothiazide (PRINZIDE,ZESTORETIC) 20-25 MG tablet Take 1 tablet by mouth daily. 30 tablet 11  . mometasone (NASONEX) 50 MCG/ACT nasal spray Place 2 sprays into the nose daily as needed (for allergies).    . Multiple Vitamin (MULTIVITAMIN) capsule Take 1 capsule by mouth daily.    . nitroGLYCERIN (NITROSTAT) 0.4 MG SL tablet  Place 0.4 mg under the tongue every 5 (five) minutes as needed for chest pain. Reported on 08/08/2015    . Omega-3 Fatty Acids (FISH OIL) 1000 MG CAPS Take 2,000 mg by mouth daily.    Marland Kitchen PROAIR RESPICLICK 108 (90 Base) MCG/ACT AEPB Inhale 2 puffs into the lungs every 4 (four) hours as needed. (cough/wheezing)  1  . simvastatin (ZOCOR) 20 MG tablet Take 1 tablet (20 mg total) by mouth every evening. 30 tablet 9  . insulin lispro protamine-lispro (HUMALOG 50/50 MIX) (50-50) 100 UNIT/ML SUSP injection Inject 5 Units into the skin  at bedtime.    . metoprolol (LOPRESSOR) 50 MG tablet Take 1 tablet (50 mg total) by mouth 2 (two) times daily. 180 tablet 3  . NOVOLOG MIX 70/30 FLEXPEN (70-30) 100 UNIT/ML Pen Inject 15-25 Units into the skin 2 (two) times daily. 25 units every morning and 10-15 units every evening  99  . SitaGLIPtin-MetFORMIN HCl (JANUMET XR) 50-1000 MG TB24 Take 1 tablet by mouth at bedtime.      No facility-administered medications prior to visit.      Allergies:   Sulfa antibiotics   Social History   Social History  . Marital status: Single    Spouse name: N/A  . Number of children: N/A  . Years of education: N/A   Social History Main Topics  . Smoking status: Never Smoker  . Smokeless tobacco: Never Used  . Alcohol use No  . Drug use: No  . Sexual activity: Not Asked   Other Topics Concern  . None   Social History Narrative  . None     Family History:  The patient's family history includes Diabetes in her brother and mother; Hypertension in her mother and sister.   ROS:   Please see the history of present illness.    Back and muscle pain. Otherwise no complaints.  All other systems reviewed and are negative.   PHYSICAL EXAM:   VS:  BP 138/74 (BP Location: Right Arm)   Pulse 96   Ht 5\' 5"  (1.651 m)   Wt 220 lb (99.8 kg)   BMI 36.61 kg/m    GEN: Well nourished, well developed, in no acute distress  HEENT: normal  Neck: no JVD, carotid bruits, or masses Cardiac: RRR; no murmurs, rubs, or gallops,no edema  Respiratory:  clear to auscultation bilaterally, normal work of breathing GI: soft, nontender, nondistended, + BS MS: no deformity or atrophy  Skin: warm and dry, no rash Neuro:  Alert and Oriented x 3, Strength and sensation are intact Psych: euthymic mood, full affect  Wt Readings from Last 3 Encounters:  09/02/16 220 lb (99.8 kg)  03/05/16 223 lb (101.2 kg)  10/30/15 220 lb 12.8 oz (100.2 kg)      Studies/Labs Reviewed:   EKG:  EKG  Normal sinus rhythm at 96  bpm, prominent voltage, and otherwise unremarkable. No change when compared to prior.  Recent Labs: 11/07/2015: ALT 25; Brain Natriuretic Peptide 53.3; BUN 19; Creat 0.91; Potassium 5.3; Sodium 141   Lipid Panel No results found for: CHOL, TRIG, HDL, CHOLHDL, VLDL, LDLCALC, LDLDIRECT  Additional studies/ records that were reviewed today include:  Carotid Doppler January 2018: Heterogeneous plaque, bilaterally. Stable 1-39% RICA stenosis. Stable 40-59% LICA stenosis. Normal subclavian arteries, bilaterally. Patent vertebral arteries with antegrade flow.   ASSESSMENT:    1. Atherosclerosis of native coronary artery of native heart with stable angina pectoris (HCC)   2. Bilateral carotid artery disease (HCC)  3. Essential hypertension   4. Diabetes mellitus with complication (HCC)      PLAN:  In order of problems listed above:  1. Stable without change in anginal pattern. Episodes are rare. We tried to increase metoprolol. She was concerned about doing that. She feels stable currently and certainly no progression or change. 2. Recent Doppler study demonstrated less than 60% obstruction on the left carotid and less than 40% in the right carotid. This is stable year over a year. Continue aggressive risk factor modification. 3. 2 g sodium diet, weight loss, aerobic activity wall recommended to keep blood pressure less than 130/90 mmHg. If pressure becomes an issue, I would increase metoprolol to 50 mg twice a day as previously directed. 4. Hemoglobin A1c target less than 7.0.  Overall doing well. Angina pounds and is stable. Clinical follow-up in one year. Continue to follow carotid obstructive disease. Call if neurological complaints.    Medication Adjustments/Labs and Tests Ordered: Current medicines are reviewed at length with the patient today.  Concerns regarding medicines are outlined above.  Medication changes, Labs and Tests ordered today are listed in the Patient  Instructions below. There are no Patient Instructions on file for this visit.   Signed, Lesleigh Noe, MD  09/02/2016 2:32 PM    [facility] [address] [place], [place], [place]  [address] Phone: [phone]; Fax: [phone]

## 2016-09-02 ENCOUNTER — Encounter: Payer: Self-pay | Admitting: Interventional Cardiology

## 2016-09-02 ENCOUNTER — Ambulatory Visit (INDEPENDENT_AMBULATORY_CARE_PROVIDER_SITE_OTHER): Payer: Managed Care, Other (non HMO) | Admitting: Interventional Cardiology

## 2016-09-02 VITALS — BP 138/74 | HR 96 | Ht 65.0 in | Wt 220.0 lb

## 2016-09-02 DIAGNOSIS — I25118 Atherosclerotic heart disease of native coronary artery with other forms of angina pectoris: Secondary | ICD-10-CM

## 2016-09-02 DIAGNOSIS — I10 Essential (primary) hypertension: Secondary | ICD-10-CM

## 2016-09-02 DIAGNOSIS — I779 Disorder of arteries and arterioles, unspecified: Secondary | ICD-10-CM

## 2016-09-02 DIAGNOSIS — I739 Peripheral vascular disease, unspecified: Secondary | ICD-10-CM

## 2016-09-02 DIAGNOSIS — E118 Type 2 diabetes mellitus with unspecified complications: Secondary | ICD-10-CM

## 2016-09-02 NOTE — Patient Instructions (Addendum)
Medication Instructions:  1) Metorprolol Tartrate 25mg  twice daily.   Labwork: None  Testing/Procedures: None  Follow-Up: Your physician wants you to follow-up in: 1 year with Dr. .  You will receive a reminder letter in the mail two months in advance. If you don't receive a letter, please call our office to schedule the follow-up appointment.   Any Other Special Instructions Will Be Listed Below (If Applicable).     If you need a refill on your cardiac medications before your next appointment, please call your pharmacy.

## 2016-10-14 ENCOUNTER — Other Ambulatory Visit: Payer: Self-pay | Admitting: Interventional Cardiology

## 2016-10-14 DIAGNOSIS — I10 Essential (primary) hypertension: Secondary | ICD-10-CM

## 2016-10-23 ENCOUNTER — Emergency Department (HOSPITAL_BASED_OUTPATIENT_CLINIC_OR_DEPARTMENT_OTHER): Payer: Managed Care, Other (non HMO)

## 2016-10-23 ENCOUNTER — Emergency Department (HOSPITAL_BASED_OUTPATIENT_CLINIC_OR_DEPARTMENT_OTHER)
Admission: EM | Admit: 2016-10-23 | Discharge: 2016-10-23 | Disposition: A | Discharge status: Home / Self Care | Payer: Managed Care, Other (non HMO) | Attending: Emergency Medicine | Admitting: Emergency Medicine

## 2016-10-23 ENCOUNTER — Encounter (HOSPITAL_BASED_OUTPATIENT_CLINIC_OR_DEPARTMENT_OTHER): Payer: Self-pay

## 2016-10-23 DIAGNOSIS — M79601 Pain in right arm: Secondary | ICD-10-CM | POA: Diagnosis present

## 2016-10-23 DIAGNOSIS — E119 Type 2 diabetes mellitus without complications: Secondary | ICD-10-CM | POA: Insufficient documentation

## 2016-10-23 DIAGNOSIS — Z79899 Other long term (current) drug therapy: Secondary | ICD-10-CM | POA: Insufficient documentation

## 2016-10-23 DIAGNOSIS — Z794 Long term (current) use of insulin: Secondary | ICD-10-CM | POA: Insufficient documentation

## 2016-10-23 DIAGNOSIS — M7989 Other specified soft tissue disorders: Secondary | ICD-10-CM

## 2016-10-23 DIAGNOSIS — I10 Essential (primary) hypertension: Secondary | ICD-10-CM | POA: Diagnosis not present

## 2016-10-23 MED ORDER — PREDNISONE 10 MG PO TABS: 40.0000 mg | ORAL_TABLET | Freq: Every day | ORAL | 0 refills | Status: DC

## 2016-10-23 MED ORDER — TRAMADOL HCL 50 MG PO TABS: 50.0000 mg | ORAL_TABLET | Freq: Four times a day (QID) | ORAL | 0 refills | Status: DC | PRN

## 2016-10-23 MED ORDER — IBUPROFEN 400 MG PO TABS: 400.0000 mg | ORAL_TABLET | Freq: Four times a day (QID) | ORAL | 0 refills | Status: DC | PRN

## 2016-10-23 MED FILL — IBUPROFEN 400 MG TABLET: 400 | 8 days supply | Qty: 30 | Fill #0

## 2016-10-23 NOTE — Discharge Instructions (Signed)
As discussed, you can alternate tylenol arthritis and ibuprofen while you wait to see ortho or your primary care provider.  Follow up with orthopedics. Return to the emergency department if pain worsens, fever, chills, redness, warmth or any other new concerning symptoms in the meantime.

## 2016-10-23 NOTE — ED Provider Notes (Signed)
[facility] Provider Note   CSN: [id number] Arrival date & time: 10/23/16  1252     History   Chief Complaint Chief Complaint  Patient presents with  . Arm Pain    HPI Darlene Rivera is a 66 y.o. female presenting with sudden onset right arm pain and swelling upon awakening this morning. She has tried Tylenol without relief. 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 Pain is moving from her upper arm down to her elbow and dorsum of the hand. She also noted some discoloration over the second and third metacarpals. She denies fever, chills, chest pain, shortness of breath, weakness or other symptoms.  HPI  Past Medical History:  Diagnosis Date  . Chest pain   . Diabetes mellitus without complication (HCC)   . GERD (gastroesophageal reflux disease)   . Hyperlipidemia   . Hypertension   . Rheumatoid arthritis(714.0)     Patient Active Problem List   Diagnosis Date Noted  . Carotid artery disease (HCC) 10/29/2015  . Obesity (BMI 35.0-39.9 without comorbidity) 08/12/2015  . Angina pectoris (HCC) 08/11/2015  . Coronary atherosclerosis of native coronary artery 05/31/2013  . Diabetes mellitus with complication (HCC)   . Hypertension   . GERD (gastroesophageal reflux disease)   . Rheumatoid arthritis(714.0)   . Hyperlipidemia     Past Surgical History:  Procedure Laterality Date  . CARDIAC CATHETERIZATION N/A 08/11/2015   Procedure: Left Heart Cath and Coronary Angiography;  Surgeon: Lyn Records, MD;  Location: [facility] INVASIVE CV LAB;  Service: Cardiovascular;  Laterality: N/A;  . CARDIAC CATHETERIZATION N/A 08/11/2015   Procedure: Coronary Stent Intervention;  Surgeon: Lyn Records, MD;  Location: [facility] INVASIVE CV LAB;  Service: Cardiovascular;  Laterality: N/A;  . CARPAL TUNNEL RELEASE Right   . CORONARY ANGIOPLASTY WITH STENT PLACEMENT  2011   DES to the circumflex /notes 08/08/2015  . NEUROPLASTY /  TRANSPOSITION MEDIAN NERVE AT CARPAL TUNNEL Left   . ROUX-EN-Y GASTRIC BYPASS  2002  . TONSILLECTOMY      OB History    No data available       Home Medications    Prior to Admission medications   Medication Sig Start Date End Date Taking? Authorizing Provider  AMBULATORY NON FORMULARY MEDICATION Take 90 mg by mouth 2 (two) times daily. Medication Name: Brilinta 90 mg BID (TWILIGHT Research study PROVIDED) 08/14/15   Kathleene Hazel, MD  AMBULATORY NON FORMULARY MEDICATION Take 81 mg by mouth daily. Medication Name: ASA 81 mg Daily or PLACEBO (TWILIGHT Research study provided) 10/30/15   Kathleene Hazel, MD  Calcium Carbonate-Vitamin D (CALCIUM-VITAMIN D) 500-200 MG-UNIT per tablet Take 1 tablet by mouth daily.    [provider]  cetirizine (ZYRTEC) 10 MG tablet Take 10 mg by mouth at bedtime. 05/18/15   [provider]  cholecalciferol (VITAMIN D) 1000 UNITS tablet Take 2,000 Units by mouth daily.    [provider]  Cyanocobalamin (VITAMIN B-12 PO) Take 1 tablet by mouth daily.    [provider]  ferrous fumarate (HEMOCYTE - 106 MG FE) 325 (106 FE) MG TABS tablet Take 1 tablet by mouth daily before lunch.    [provider]  ibuprofen (ADVIL,MOTRIN) 400 MG tablet Take 1 tablet (400 mg total) by mouth every 6 (six) hours as needed. 10/23/16   Mathews Robinsons B, PA-C  insulin aspart protamine- aspart (NOVOLOG MIX 70/30) (  70-30) 100 UNIT/ML injection Inject 40 Units into the skin daily with breakfast.    [provider]  Insulin Lispro (HUMALOG KWIKPEN Karnes) Inject 10 Units into the skin at bedtime.    [provider]  ipratropium (ATROVENT) 0.06 % nasal spray Place 2 sprays into both nostrils daily as needed for rhinitis.    [provider]  isosorbide mononitrate (IMDUR) 30 MG 24 hr tablet TAKE 1 TABLET (30 MG TOTAL) BY MOUTH DAILY. 05/27/16   Dyann Kief, PA-C  lisinopril-hydrochlorothiazide  (PRINZIDE,ZESTORETIC) 20-25 MG tablet TAKE 1 TABLET BY MOUTH DAILY. 10/14/16   Lyn Records, MD  metFORMIN (GLUCOPHAGE) 500 MG tablet Take 500 mg by mouth 2 (two) times daily with a meal.    [provider]  metoprolol tartrate (LOPRESSOR) 25 MG tablet Take 25 mg by mouth 2 (two) times daily.    [provider]  mometasone (NASONEX) 50 MCG/ACT nasal spray Place 2 sprays into the nose daily as needed (for allergies).    [provider]  Multiple Vitamin (MULTIVITAMIN) capsule Take 1 capsule by mouth daily.    [provider]  nitroGLYCERIN (NITROSTAT) 0.4 MG SL tablet Place 0.4 mg under the tongue every 5 (five) minutes as needed for chest pain. Reported on 08/08/2015    [provider]  Omega-3 Fatty Acids (FISH OIL) 1000 MG CAPS Take 2,000 mg by mouth daily.    [provider]  PROAIR RESPICLICK 108 (90 Base) MCG/ACT AEPB Inhale 2 puffs into the lungs every 4 (four) hours as needed. (cough/wheezing) 09/20/15   [provider]  simvastatin (ZOCOR) 20 MG tablet Take 1 tablet (20 mg total) by mouth every evening. 10/27/15   Lyn Records, MD    Family History Family History  Problem Relation Age of Onset  . Diabetes Mother   . Hypertension Mother   . Diabetes Brother   . Hypertension Sister     Social History Social History  Substance Use Topics  . Smoking status: Never Smoker  . Smokeless tobacco: Never Used  . Alcohol use No     Allergies   Sulfa antibiotics   Review of Systems Review of Systems  Constitutional: Negative for chills and fever.  Eyes: Negative for pain and visual disturbance.  Respiratory: Negative for cough, shortness of breath, wheezing and stridor.   Cardiovascular: Negative for chest pain, palpitations and leg swelling.  Gastrointestinal: Negative for abdominal distention, abdominal pain, diarrhea, nausea and vomiting.  Musculoskeletal: Positive for arthralgias, joint swelling and myalgias.  Negative for back pain, gait problem, neck pain and neck stiffness.  Skin: Positive for color change. Negative for pallor, rash and wound.  Neurological: Negative for tremors, seizures, syncope, weakness and numbness.     Physical Exam Updated Vital Signs BP (!) 157/71 (BP Location: Left Arm)   Pulse 86   Temp 98.5 F (36.9 C) (Oral)   Resp 18   Ht 5\' 6"  (1.676 m)   Wt 100.7 kg   SpO2 100%   BMI 35.83 kg/m   Physical Exam  Constitutional: She appears well-developed and well-nourished. No distress.  Patient is afebrile, nontoxic-appearing, sitting comfortably in chair in no acute distress.  HENT:  Head: Normocephalic and atraumatic.  Eyes: Conjunctivae and EOM are normal.  Neck: Normal range of motion. Neck supple.  Cardiovascular: Normal rate, regular rhythm, normal heart sounds and intact distal pulses.   No murmur heard. Pulmonary/Chest: Effort normal and breath sounds normal. No respiratory distress. She has no wheezes.  She has no rales.  Abdominal: She exhibits no distension.  Musculoskeletal: Normal range of motion. She exhibits edema and tenderness. She exhibits no deformity.  Patient has full range of motion of the shoulders elbows wrist and fingers. She reports pain with flexion of the elbow and wrist. No bony tenderness of the joints. Darkening of the skin and very mild swelling over the dorsum the right hand at the second and third metacarpals.  Neurological: She is alert. No sensory deficit.  Neurovascularly intact in upper extremities  Skin: Skin is warm and dry. Capillary refill takes less than 2 seconds. No rash noted. She is not diaphoretic. No pallor.  Psychiatric: She has a normal mood and affect.  Nursing note and vitals reviewed.    ED Treatments / Results  Labs (all labs ordered are listed, but only abnormal results are displayed) Labs Reviewed - No data to display  EKG  EKG Interpretation None       Radiology US Venous Img Upper Uni  Right  Result Date: 10/23/2016 CLINICAL DATA:  Right shoulder pain EXAM: Right UPPER EXTREMITY VENOUS DOPPLER ULTRASOUND TECHNIQUE: Gray-scale sonography with graded compression, as well as color Doppler and duplex ultrasound were performed to evaluate the upper extremity deep venous system from the level of the subclavian vein and including the jugular, axillary, basilic, radial, ulnar and upper cephalic vein. Spectral Doppler was utilized to evaluate flow at rest and with distal augmentation maneuvers. COMPARISON:  None. FINDINGS: Contralateral Subclavian Vein: Respiratory phasicity is normal and symmetric with the symptomatic side. No evidence of thrombus. Normal compressibility. Internal Jugular Vein: No evidence of thrombus. Normal compressibility, respiratory phasicity and response to augmentation. Subclavian Vein: No evidence of thrombus. Normal compressibility, respiratory phasicity and response to augmentation. Axillary Vein: No evidence of thrombus. Normal compressibility, respiratory phasicity and response to augmentation. Cephalic Vein: No evidence of thrombus. Normal compressibility, respiratory phasicity and response to augmentation. Basilic Vein: No evidence of thrombus. Normal compressibility, respiratory phasicity and response to augmentation. Brachial Veins: No evidence of thrombus. Normal compressibility, respiratory phasicity and response to augmentation. Radial Veins: No evidence of thrombus. Normal compressibility, respiratory phasicity and response to augmentation. Ulnar Veins: No evidence of thrombus. Normal compressibility, respiratory phasicity and response to augmentation. Venous Reflux:  None visualized. Other Findings:  None visualized. IMPRESSION: No evidence of DVT within the right upper extremity. Electronically Signed   By: Signa Kell M.D.   On: 10/23/2016 15:53    Procedures Procedures (including critical care time)  Medications Ordered in ED Medications - No data to  display   Initial Impression / Assessment and Plan / ED Course  I have reviewed the triage vital signs and the nursing notes.  Pertinent labs & imaging results that were available during my care of the patient were reviewed by me and considered in my medical decision making (see chart for details).     Patient presents with sudden onset right upper extremity pain somewhat diffuse in nature and may have some mild radicular component. Ultrasound venous without evidence of DVT.  Offered short course prednisone and pain management and discussed risk and benefits with Patient. Patient refused. She wants to check with her PCP/endocrinologist first. She will be alternating tylenol and ibuprofen until she speaks to her PCP in the morning. DC home with symptomatic relief and close PCP follow up.  Patient was discussed with Dr. Linwood Dibbles who has seen patient and agrees with assessment and plan Discussed strict return precautions and advised to return to  the emergency department if experiencing any new or worsening symptoms. Instructions were understood and patient agreed with discharge plan.  Final Clinical Impressions(s) / ED Diagnoses   Final diagnoses:  Right arm pain  Arm swelling    New Prescriptions Discharge Medication List as of 10/23/2016  4:22 PM    START taking these medications   Details  ibuprofen (ADVIL,MOTRIN) 400 MG tablet Take 1 tablet (400 mg total) by mouth every 6 (six) hours as needed., Starting Wed 10/23/2016, Print         Mathews Robinsons B, PA-C 10/23/16 1704    Melene Plan, DO 10/25/16 2323

## 2016-10-23 NOTE — ED Triage Notes (Addendum)
c/o pain to entire right UE x today-denies injury-states pain is worse with movement-grimaced when O2 sat applied and when she attempted to turn door handle to exit triage-NAD-steady gait

## 2016-10-24 ENCOUNTER — Other Ambulatory Visit: Payer: Self-pay | Admitting: Internal Medicine

## 2016-10-24 ENCOUNTER — Ambulatory Visit (HOSPITAL_COMMUNITY)
Admission: RE | Admit: 2016-10-24 | Discharge: 2016-10-24 | Disposition: A | Discharge status: Home / Self Care | Payer: Managed Care, Other (non HMO) | Source: Ambulatory Visit | Attending: Internal Medicine | Admitting: Internal Medicine

## 2016-10-24 DIAGNOSIS — M25531 Pain in right wrist: Secondary | ICD-10-CM | POA: Diagnosis not present

## 2016-10-24 DIAGNOSIS — M25521 Pain in right elbow: Secondary | ICD-10-CM | POA: Insufficient documentation

## 2016-10-24 DIAGNOSIS — M7989 Other specified soft tissue disorders: Secondary | ICD-10-CM

## 2016-10-24 DIAGNOSIS — M799 Soft tissue disorder, unspecified: Secondary | ICD-10-CM

## 2016-10-24 DIAGNOSIS — M79609 Pain in unspecified limb: Secondary | ICD-10-CM

## 2016-10-24 DIAGNOSIS — M5030 Other cervical disc degeneration, unspecified cervical region: Secondary | ICD-10-CM | POA: Insufficient documentation

## 2016-11-19 ENCOUNTER — Telehealth: Payer: Self-pay | Admitting: *Deleted

## 2016-11-19 NOTE — Telephone Encounter (Signed)
-----   Message from Lyn Records, MD sent at 11/12/2016  6:30 PM EDT ----- Regarding: RE: TWILIGHT End of treatment Thanks for the reminder. We should start Plavix/clopidogrel 75 mg daily at the completion of the study. My nurse Constance Holster will help arrange. ----- Message ----- From: Orrin Brigham, RN Sent: 11/12/2016   1:21 PM To: Lyn Records, MD Subject: Darlene Rivera End of treatment                      Dr. Katrinka Blazing,     [name] has a Research appointment 11-25-16. She will no longer receive ASA/Placebo or Brilinta. Further antiplatelet therapy will be at your discretion. (Including ASA). A Script will need to be sent to her pharmacy.  Thank you Tammy

## 2016-11-19 NOTE — Telephone Encounter (Signed)
Left message to call back  

## 2016-11-20 NOTE — Telephone Encounter (Signed)
New message  ° ° ° ° °Pt is returning your call  °

## 2016-11-20 NOTE — Telephone Encounter (Signed)
Spoke with pt and made her aware of medication changes per Dr. Katrinka Blazing once she finishes Brilinta.  Pt verbalized understanding and was in agreement with this plan.  Will plan to send in Plavix after Friday appt with Research.

## 2016-11-25 ENCOUNTER — Encounter: Payer: Self-pay | Admitting: *Deleted

## 2016-11-25 ENCOUNTER — Other Ambulatory Visit: Payer: Self-pay | Admitting: *Deleted

## 2016-11-25 DIAGNOSIS — Z006 Encounter for examination for normal comparison and control in clinical research program: Secondary | ICD-10-CM

## 2016-11-25 MED ORDER — CLOPIDOGREL BISULFATE 75 MG PO TABS: 75.0000 mg | ORAL_TABLET | Freq: Every day | ORAL | 3 refills | Status: DC

## 2016-11-25 NOTE — Progress Notes (Signed)
TWILIGHT Research study month 15 follow up visit completed. Patient denies any bleeding or other adverse events. Patient has been 91 % compliant with ASA/Placebo and Brilinta. She is to start Plavix 75 mg daily but has NOT picked up script. I gave her remaining Brilinta and ASA/Placebo until script is called in and Picked up. She states she was told to start ASA 81 mg also with Plavix. I have encouraged patient to confirm the ASA with cardiology office. She has 1 last telephone follow up for the TWILIGHT research study to be done before 01/SEP/2018. Questions encouraged and answered.

## 2016-11-25 NOTE — Telephone Encounter (Signed)
Dr. Katrinka Blazing, do you want pt to stay on ASA 81 QD?

## 2016-11-27 NOTE — Telephone Encounter (Signed)
Spoke with pt and reminded her to continue ASA 81mg  QD.  Pt verbalized understanding and was in agreement with this plan.

## 2016-11-27 NOTE — Telephone Encounter (Signed)
Spoke with Dr. Katrinka Blazing and he does want pt to continue ASA 81mg  QD.    Left message to call back

## 2016-12-01 ENCOUNTER — Other Ambulatory Visit: Payer: Self-pay | Admitting: Interventional Cardiology

## 2017-02-03 ENCOUNTER — Telehealth: Payer: Self-pay | Admitting: *Deleted

## 2017-02-03 DIAGNOSIS — I25118 Atherosclerotic heart disease of native coronary artery with other forms of angina pectoris: Secondary | ICD-10-CM

## 2017-02-03 NOTE — Telephone Encounter (Signed)
Pt returned call for 18 month follow up phone visit. She is doing well with no complaints of bleeding, cp, or SOB. Per the patient she is taking ASA 81 mg daily and Plavix 75 mg daily. She will continue to see her heart doctor for follow up. I thanked her for participating in the study.

## 2017-02-04 MED ORDER — ASPIRIN EC 81 MG PO TBEC: 81.0000 mg | DELAYED_RELEASE_TABLET | Freq: Every day | ORAL | 3 refills | Status: DC

## 2017-02-04 NOTE — Addendum Note (Signed)
Addended by: Mercer Pod D on: 02/04/2017 09:33 AM   Modules accepted: Orders

## 2017-02-10 ENCOUNTER — Other Ambulatory Visit: Payer: Self-pay | Admitting: Interventional Cardiology

## 2017-02-12 ENCOUNTER — Other Ambulatory Visit: Payer: Self-pay | Admitting: Interventional Cardiology

## 2017-02-20 ENCOUNTER — Other Ambulatory Visit: Payer: Self-pay

## 2017-02-20 MED ORDER — METOPROLOL TARTRATE 25 MG PO TABS
25.0000 mg | ORAL_TABLET | Freq: Two times a day (BID) | ORAL | 1 refills | Status: DC
Start: 2017-02-20 — End: 2017-04-30

## 2017-03-19 ENCOUNTER — Other Ambulatory Visit: Payer: Self-pay | Admitting: Interventional Cardiology

## 2017-03-31 ENCOUNTER — Ambulatory Visit (HOSPITAL_COMMUNITY)
Admission: RE | Admit: 2017-03-31 | Discharge: 2017-03-31 | Disposition: A | Discharge status: Home / Self Care | Payer: Managed Care, Other (non HMO) | Source: Ambulatory Visit | Attending: Internal Medicine | Admitting: Internal Medicine

## 2017-03-31 ENCOUNTER — Other Ambulatory Visit: Payer: Self-pay | Admitting: Internal Medicine

## 2017-03-31 DIAGNOSIS — G8929 Other chronic pain: Secondary | ICD-10-CM | POA: Insufficient documentation

## 2017-03-31 DIAGNOSIS — M160 Bilateral primary osteoarthritis of hip: Secondary | ICD-10-CM | POA: Diagnosis not present

## 2017-04-23 ENCOUNTER — Other Ambulatory Visit: Payer: Self-pay | Admitting: Interventional Cardiology

## 2017-04-23 NOTE — Telephone Encounter (Signed)
Medication Detail    Disp Refills Start End   metoprolol tartrate (LOPRESSOR) 25 MG tablet 180 tablet 1 02/20/2017    Sig - Route: Take 1 tablet (25 mg total) by mouth 2 (two) times daily. - Oral   Sent to pharmacy as: metoprolol tartrate (LOPRESSOR) 25 MG tablet   E-Prescribing Status: Receipt confirmed by pharmacy (02/20/2017 5:11 PM EDT)   Pharmacy   [id number] [place], [place] - [address]

## 2017-04-28 ENCOUNTER — Other Ambulatory Visit: Payer: Self-pay | Admitting: Interventional Cardiology

## 2017-04-28 ENCOUNTER — Other Ambulatory Visit: Payer: Self-pay | Admitting: Physician Assistant

## 2017-04-30 ENCOUNTER — Other Ambulatory Visit: Payer: Self-pay

## 2017-04-30 MED ORDER — METOPROLOL TARTRATE 25 MG PO TABS: 25.0000 mg | ORAL_TABLET | Freq: Two times a day (BID) | ORAL | 3 refills | Status: DC

## 2017-05-16 ENCOUNTER — Other Ambulatory Visit: Payer: Self-pay | Admitting: Interventional Cardiology

## 2017-06-17 HISTORY — PX: CATARACT EXTRACTION: SUR2

## 2017-08-06 ENCOUNTER — Other Ambulatory Visit: Payer: Self-pay | Admitting: Interventional Cardiology

## 2017-08-08 ENCOUNTER — Telehealth: Payer: Self-pay | Admitting: Interventional Cardiology

## 2017-08-08 NOTE — Telephone Encounter (Signed)
Called pt, per Constance Holster, LPN, Dr. Michaelle Copas nurse, to see what medication strength that pt was taking of metoprolol. Pt stated that she was taking metoprolol 50 mg tablet BID, per Victorino Dike, LPN, Dr. Michaelle Copas nurse, ok to refill the medication that the pt was taking. Metoprolol 50 mg tablet BID was sent to pt's pharmacy as requested. Confirmation received. FYI

## 2017-08-18 IMAGING — NM NM MISC PROCEDURE
6 series · 36 of 36 positions shown · non-contrast
Comparison: none

[Series 1: wbr_r-proj_st wbr rest · 6.40mm/px · 6 of 64 frames shown]
[frame 6/64]
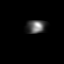
[frame 16/64]
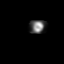
[frame 27/64]
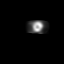
[frame 38/64]
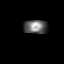
[frame 48/64]
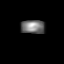
[frame 59/64]
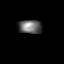

[Series 1: wbr rest · 6.40mm/px · 6 of 64 frames shown]
[frame 6/64]
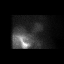
[frame 16/64]
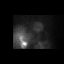
[frame 27/64]
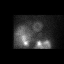
[frame 38/64]
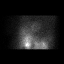
[frame 48/64]
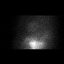
[frame 59/64]
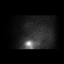

[Series 2: wbr stress-gsp · 6.40mm/px · 6 of 512 frames shown]
[frame 43/512]
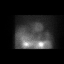
[frame 128/512]
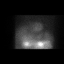
[frame 214/512]
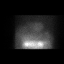
[frame 299/512]
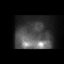
[frame 384/512]
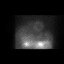
[frame 470/512]
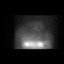

[Series 2: wbr_s-proj_st wbr stress-gsp · 6.40mm/px · 6 of 512 frames shown]
[frame 43/512]
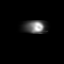
[frame 128/512]
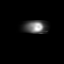
[frame 214/512]
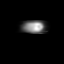
[frame 299/512]
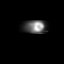
[frame 384/512]
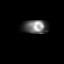
[frame 470/512]
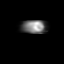

[Series 3: wbr stress-sum-em · 6.40mm/px · 6 of 64 frames shown]
[frame 6/64]
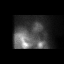
[frame 16/64]
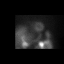
[frame 27/64]
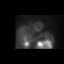
[frame 38/64]
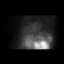
[frame 48/64]
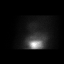
[frame 59/64]
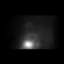

[Series 3: wbr_s-proj_st wbr stress-sum-em · 6.40mm/px · 6 of 64 frames shown]
[frame 6/64]
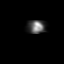
[frame 16/64]
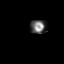
[frame 27/64]
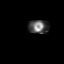
[frame 38/64]
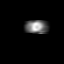
[frame 48/64]
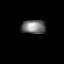
[frame 59/64]
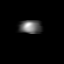

[36 of 36 positions shown; findings below may reference images not displayed]

Canned report from images found in remote index.

Refer to host system for actual result text.

## 2017-09-02 ENCOUNTER — Encounter: Payer: Self-pay | Admitting: Nurse Practitioner

## 2017-09-02 ENCOUNTER — Ambulatory Visit (INDEPENDENT_AMBULATORY_CARE_PROVIDER_SITE_OTHER): Payer: Managed Care, Other (non HMO) | Admitting: Nurse Practitioner

## 2017-09-02 VITALS — BP 130/70 | HR 74 | Ht 64.0 in | Wt 217.8 lb

## 2017-09-02 DIAGNOSIS — I25118 Atherosclerotic heart disease of native coronary artery with other forms of angina pectoris: Secondary | ICD-10-CM | POA: Diagnosis not present

## 2017-09-02 DIAGNOSIS — I6523 Occlusion and stenosis of bilateral carotid arteries: Secondary | ICD-10-CM

## 2017-09-02 DIAGNOSIS — I10 Essential (primary) hypertension: Secondary | ICD-10-CM | POA: Diagnosis not present

## 2017-09-02 DIAGNOSIS — I259 Chronic ischemic heart disease, unspecified: Secondary | ICD-10-CM | POA: Diagnosis not present

## 2017-09-02 DIAGNOSIS — I779 Disorder of arteries and arterioles, unspecified: Secondary | ICD-10-CM | POA: Diagnosis not present

## 2017-09-02 DIAGNOSIS — E7849 Other hyperlipidemia: Secondary | ICD-10-CM | POA: Diagnosis not present

## 2017-09-02 DIAGNOSIS — I739 Peripheral vascular disease, unspecified: Secondary | ICD-10-CM

## 2017-09-02 MED ORDER — NITROGLYCERIN 0.4 MG SL SUBL: 0.4000 mg | SUBLINGUAL_TABLET | SUBLINGUAL | 3 refills | Status: DC | PRN

## 2017-09-02 NOTE — Progress Notes (Signed)
CARDIOLOGY OFFICE NOTE  Date:  09/02/2017    Darlene Rivera Date of Birth: 1950-12-02 Medical Record #[id number]  PCP:  Laurena Slimmer, MD  Cardiologist:  Katrinka Blazing  Chief Complaint  Patient presents with  . Coronary Artery Disease  . Hypertension  . Hyperlipidemia    1 year check - seen for Dr. Katrinka Blazing    History of Present Illness: Darlene Rivera is a 67 y.o. female who presents today for a one year check. Seen for Dr. Katrinka Blazing.   She has a history of CAD with remote DES to the circumflex, prior LAD diagonal bifurcation intervention (07/2015) with provisional stenting of LAD and angioplasty of diagonal with excellent result, hypertension, hyperlipidemia, obesity, PVD with carotid disease and diabetes.  Last seen a year ago by Dr. Katrinka Blazing - was felt to be doing ok. At prior visit her dose of Metoprolol was increased - she was reluctant to do this and felt that her HR and BP were always higher here and not at home. No chest pain noted. Last carotid study from January of 2018 - up to 60% L stenosis noted.   Comes in today. Here alone. I saw her 2 years ago. She says she is doing ok. She is sedentary with her job. Sugars are ok. She has lost a few pounds. BP is doing well at home. She is not using any NTG - does not have any - but no reason to need per her report. She says she will have little "bouts of chest pain" - this has apparently been chronic for years - it has not changed. It has not worsened. She feels like she is stable and has no real concerns today. Dr. Chestine Spore has retired and she will be seeing a new PCP later this week. Labs typically done by PCP.     Past Medical History:  Diagnosis Date  . Chest pain   . Diabetes mellitus without complication (HCC)   . GERD (gastroesophageal reflux disease)   . Hyperlipidemia   . Hypertension   . Rheumatoid arthritis(714.0)     Past Surgical History:  Procedure Laterality Date  . CARDIAC CATHETERIZATION N/A 08/11/2015   Procedure: Left Heart Cath and Coronary Angiography;  Surgeon: Lyn Records, MD;  Location: [facility] INVASIVE CV LAB;  Service: Cardiovascular;  Laterality: N/A;  . CARDIAC CATHETERIZATION N/A 08/11/2015   Procedure: Coronary Stent Intervention;  Surgeon: Lyn Records, MD;  Location: [facility] INVASIVE CV LAB;  Service: Cardiovascular;  Laterality: N/A;  . CARPAL TUNNEL RELEASE Right   . CORONARY ANGIOPLASTY WITH STENT PLACEMENT  2011   DES to the circumflex /notes 08/08/2015  . NEUROPLASTY / TRANSPOSITION MEDIAN NERVE AT CARPAL TUNNEL Left   . ROUX-EN-Y GASTRIC BYPASS  2002  . TONSILLECTOMY       Medications: Current Meds  Medication Sig  . allopurinol (ZYLOPRIM) 300 MG tablet TAKE HALF A TABLET DAILY  . aspirin EC 81 MG tablet Take 1 tablet (81 mg total) by mouth daily.  . Calcium Carbonate-Vitamin D (CALCIUM-VITAMIN D) 500-200 MG-UNIT per tablet Take 1 tablet by mouth 2 (two) times daily.   . cetirizine (ZYRTEC) 10 MG tablet Take 10 mg by mouth at bedtime.  . cholecalciferol (VITAMIN D) 1000 UNITS tablet Take 2,000 Units by mouth daily.  . clopidogrel (PLAVIX) 75 MG tablet Take 1 tablet (75 mg total) by mouth daily.  . Cyanocobalamin (VITAMIN B-12 PO) Take 1 tablet by mouth daily.  . ferrous fumarate (HEMOCYTE -  106 MG FE) 325 (106 FE) MG TABS tablet Take 1 tablet by mouth daily before lunch.  . furosemide (LASIX) 40 MG tablet Take 40 mg by mouth daily.  Marland Kitchen ibuprofen (ADVIL,MOTRIN) 400 MG tablet Take 1 tablet (400 mg total) by mouth every 6 (six) hours as needed.  . insulin aspart protamine- aspart (NOVOLOG MIX 70/30) (70-30) 100 UNIT/ML injection Inject 40 Units into the skin daily with breakfast.  . Insulin Lispro (HUMALOG KWIKPEN [place]) Inject 10 Units into the skin at bedtime.  Marland Kitchen ipratropium (ATROVENT) 0.06 % nasal spray Place 2 sprays into both nostrils daily as needed for rhinitis.  Marland Kitchen isosorbide mononitrate (IMDUR) 30 MG 24 hr tablet TAKE 1 TABLET (30 MG TOTAL) BY MOUTH DAILY.  . metFORMIN  (GLUCOPHAGE) 500 MG tablet Take 500 mg by mouth 2 (two) times daily with a meal.  . metoprolol tartrate (LOPRESSOR) 50 MG tablet Take 1 tablet (50 mg total) by mouth 2 (two) times daily. Please keep upcoming appt for future refills. Thank you  . mometasone (NASONEX) 50 MCG/ACT nasal spray Place 2 sprays into the nose daily as needed (for allergies).  . Multiple Vitamin (MULTIVITAMIN) capsule Take 1 capsule by mouth daily.  . Omega-3 Fatty Acids (FISH OIL) 1000 MG CAPS Take 2,000 mg by mouth daily.  Marland Kitchen PROAIR RESPICLICK 108 (90 Base) MCG/ACT AEPB Inhale 2 puffs into the lungs every 4 (four) hours as needed. (cough/wheezing)  . simvastatin (ZOCOR) 20 MG tablet Take 1 tablet (20 mg total) by mouth every evening.     Allergies: Allergies  Allergen Reactions  . Sulfa Antibiotics Nausea Only    Social History: The patient  reports that  has never smoked. she has never used smokeless tobacco. She reports that she does not drink alcohol or use drugs.   Family History: The patient's family history includes Diabetes in her brother and mother; Hypertension in her mother and sister.   Review of Systems: Please see the history of present illness.   Otherwise, the review of systems is positive for none.   All other systems are reviewed and negative.   Physical Exam: VS:  BP 130/70 (BP Location: Left Arm, Patient Position: Sitting, Cuff Size: Large)   Pulse 74   Ht 5\' 4"  (1.626 m)   Wt 217 lb 12.8 oz (98.8 kg)   BMI 37.39 kg/m  .  BMI Body mass index is 37.39 kg/m.  Wt Readings from Last 3 Encounters:  09/02/17 217 lb 12.8 oz (98.8 kg)  10/23/16 222 lb (100.7 kg)  09/02/16 220 lb (99.8 kg)    General: Pleasant. Well developed, well nourished and in no acute distress.  She has lost a few pounds.  HEENT: Normal.  Neck: Supple, no JVD, + carotid bruits, and no masses noted.  Cardiac: Regular rate and rhythm. No murmurs, rubs, or gallops. No edema.  Respiratory:  Lungs are clear to  auscultation bilaterally with normal work of breathing.  GI: Soft and nontender.  MS: No deformity or atrophy. Gait and ROM intact.  Skin: Warm and dry. Color is normal.  Neuro:  Strength and sensation are intact and no gross focal deficits noted.  Psych: Alert, appropriate and with normal affect.   LABORATORY DATA:  EKG:  EKG is ordered today. This demonstrates NSR.  Lab Results  Component Value Date   WBC 9.2 08/16/2015   HGB 11.4 (L) 08/16/2015   HCT 34.6 (L) 08/16/2015   PLT 210 08/16/2015   GLUCOSE 172 (H) 11/07/2015  ALT 25 11/07/2015   AST 22 11/07/2015   NA 141 11/07/2015   K 5.3 11/07/2015   CL 109 11/07/2015   CREATININE 0.91 11/07/2015   BUN 19 11/07/2015   CO2 22 11/07/2015   INR 0.95 08/08/2015     BNP (last 3 results) No results for input(s): BNP in the last 8760 hours.  ProBNP (last 3 results) No results for input(s): PROBNP in the last 8760 hours.   Other Studies Reviewed Today: Procedures   Coronary Stent Intervention 07/2015  Left Heart Cath and Coronary Angiography  Conclusion   1. Prox RCA to Dist RCA lesion, 30% stenosed. 2. Dist RCA lesion, 60% stenosed. 3. Ost Cx to Mid Cx lesion, 40% stenosed. The lesion was previously treated with a stent (unknown type). 4. Mid LAD to Dist LAD lesion, 25% stenosed. 5. Prox LAD lesion, 85% stenosed. Post intervention, there is a 15% residual stenosis. 6. Ost 1st Diag lesion, 75% stenosed. Post intervention, there is a 0% residual stenosis.    Continued patency in the proximal circumflex stent.  De novo eccentric 85% proximal/mid LAD and 75% diagonal bifurcation stenosis, [place] 1, 1, 0.  Native right coronary is widely patent with luminal irregularities throughout and 50% distal stenosis.  Successful angioplasty and stenting of the LAD - diagonal bifurcation lesion with placement of LAD DES and angioplasty of diagonal with final angiographic result of 15% LAD stenosis and 0% diagonal stenosis with  TIMI grade 3 flow.  Normal left ventricular function   RECOMMENDATIONS:   Anticipate a.m. Discharge  Aspirin 81 mg and Brilinta 90 mg by mouth twice a day. Enrolled in [place] study.  Needs f/u with HS in 6 weeks and APP in 1 week.      Assessment/Plan:  1. CAD - prior PCI - no NTG use. She feels like she is doing well. Has remained on chronic DAPT therapy. Labs by PCP. No changes made today. CV risk factor modification encouraged.   2. PVD - will get her carotid study updated.   3. HTN - BP looks fine here today.   4. HLD - on statin - labs checked by PCP - she will be seeing later this week - I have asked her to have a copy sent to [place].   5. Obesity - weight is down a few pounds. She is trying to do better with her diet.   6. DM - per PCP   Current medicines are reviewed with the patient today.  The patient does not have concerns regarding medicines other than what has been noted above.  The following changes have been made:  See above.  Labs/ tests ordered today include:    Orders Placed This Encounter  Procedures  . EKG 12-Lead     Disposition:   FU with Dr. Katrinka Blazing in one year.   Patient is agreeable to this plan and will call if any problems develop in the interim.   SignedNorma Fredrickson, NP  09/02/2017 8:40 AM  [facility] [address] [place], [place]  [address] Phone: [phone] Fax: [phone]

## 2017-09-02 NOTE — Patient Instructions (Addendum)
We will be checking the following labs today - NONE  See if your new PCP will send [place] a copy of your labs   Medication Instructions:    Continue with your current medicines.   I have sent in a refill for the NTG today    Testing/Procedures To Be Arranged:  Carotid doppler   Follow-Up:   See Dr. Katrinka Blazing in one year    Other Special Instructions:   N/A    If you need a refill on your cardiac medications before your next appointment, please call your pharmacy.   Call the [facility] office at [phone] if you have any questions, problems or concerns.

## 2017-09-10 ENCOUNTER — Ambulatory Visit (HOSPITAL_COMMUNITY)
Admission: RE | Admit: 2017-09-10 | Discharge: 2017-09-10 | Disposition: A | Discharge status: Home / Self Care | Payer: Managed Care, Other (non HMO) | Source: Ambulatory Visit | Attending: Cardiology | Admitting: Cardiology

## 2017-09-10 DIAGNOSIS — I6523 Occlusion and stenosis of bilateral carotid arteries: Secondary | ICD-10-CM

## 2017-09-10 DIAGNOSIS — E119 Type 2 diabetes mellitus without complications: Secondary | ICD-10-CM | POA: Diagnosis not present

## 2017-09-10 DIAGNOSIS — I10 Essential (primary) hypertension: Secondary | ICD-10-CM | POA: Diagnosis not present

## 2017-09-10 DIAGNOSIS — I6529 Occlusion and stenosis of unspecified carotid artery: Secondary | ICD-10-CM | POA: Diagnosis present

## 2017-09-10 DIAGNOSIS — I2510 Atherosclerotic heart disease of native coronary artery without angina pectoris: Secondary | ICD-10-CM | POA: Diagnosis not present

## 2017-09-10 DIAGNOSIS — Z794 Long term (current) use of insulin: Secondary | ICD-10-CM | POA: Diagnosis not present

## 2017-09-10 DIAGNOSIS — R42 Dizziness and giddiness: Secondary | ICD-10-CM | POA: Diagnosis present

## 2017-09-10 DIAGNOSIS — E785 Hyperlipidemia, unspecified: Secondary | ICD-10-CM | POA: Diagnosis not present

## 2017-09-11 ENCOUNTER — Other Ambulatory Visit: Payer: Self-pay | Admitting: Interventional Cardiology

## 2017-10-09 ENCOUNTER — Other Ambulatory Visit: Payer: Self-pay | Admitting: Physician Assistant

## 2017-10-30 ENCOUNTER — Other Ambulatory Visit: Payer: Self-pay | Admitting: Interventional Cardiology

## 2017-10-31 ENCOUNTER — Encounter

## 2017-10-31 ENCOUNTER — Ambulatory Visit: Payer: Managed Care, Other (non HMO) | Admitting: Interventional Cardiology

## 2017-11-06 ENCOUNTER — Other Ambulatory Visit: Payer: Self-pay | Admitting: Interventional Cardiology

## 2018-03-10 ENCOUNTER — Other Ambulatory Visit: Payer: Self-pay | Admitting: *Deleted

## 2018-03-10 DIAGNOSIS — I739 Peripheral vascular disease, unspecified: Principal | ICD-10-CM

## 2018-03-10 DIAGNOSIS — I779 Disorder of arteries and arterioles, unspecified: Secondary | ICD-10-CM

## 2018-03-10 NOTE — Progress Notes (Signed)
Carotid

## 2018-04-03 ENCOUNTER — Encounter (HOSPITAL_BASED_OUTPATIENT_CLINIC_OR_DEPARTMENT_OTHER): Payer: Self-pay | Admitting: *Deleted

## 2018-04-03 ENCOUNTER — Emergency Department (HOSPITAL_BASED_OUTPATIENT_CLINIC_OR_DEPARTMENT_OTHER)
Admission: EM | Admit: 2018-04-03 | Discharge: 2018-04-03 | Disposition: A | Discharge status: Home / Self Care | Payer: Managed Care, Other (non HMO) | Attending: Emergency Medicine | Admitting: Emergency Medicine

## 2018-04-03 ENCOUNTER — Emergency Department (HOSPITAL_BASED_OUTPATIENT_CLINIC_OR_DEPARTMENT_OTHER): Payer: Managed Care, Other (non HMO)

## 2018-04-03 ENCOUNTER — Other Ambulatory Visit: Payer: Self-pay

## 2018-04-03 DIAGNOSIS — Y92007 Garden or yard of unspecified non-institutional (private) residence as the place of occurrence of the external cause: Secondary | ICD-10-CM | POA: Diagnosis not present

## 2018-04-03 DIAGNOSIS — Z7982 Long term (current) use of aspirin: Secondary | ICD-10-CM | POA: Insufficient documentation

## 2018-04-03 DIAGNOSIS — Z794 Long term (current) use of insulin: Secondary | ICD-10-CM | POA: Diagnosis not present

## 2018-04-03 DIAGNOSIS — I2510 Atherosclerotic heart disease of native coronary artery without angina pectoris: Secondary | ICD-10-CM | POA: Diagnosis not present

## 2018-04-03 DIAGNOSIS — S6992XA Unspecified injury of left wrist, hand and finger(s), initial encounter: Secondary | ICD-10-CM | POA: Diagnosis not present

## 2018-04-03 DIAGNOSIS — E119 Type 2 diabetes mellitus without complications: Secondary | ICD-10-CM | POA: Insufficient documentation

## 2018-04-03 DIAGNOSIS — Y999 Unspecified external cause status: Secondary | ICD-10-CM | POA: Insufficient documentation

## 2018-04-03 DIAGNOSIS — Z7902 Long term (current) use of antithrombotics/antiplatelets: Secondary | ICD-10-CM | POA: Diagnosis not present

## 2018-04-03 DIAGNOSIS — Y93H2 Activity, gardening and landscaping: Secondary | ICD-10-CM | POA: Diagnosis not present

## 2018-04-03 DIAGNOSIS — W010XXA Fall on same level from slipping, tripping and stumbling without subsequent striking against object, initial encounter: Secondary | ICD-10-CM | POA: Diagnosis not present

## 2018-04-03 DIAGNOSIS — I10 Essential (primary) hypertension: Secondary | ICD-10-CM | POA: Insufficient documentation

## 2018-04-03 NOTE — Discharge Instructions (Signed)
Use aleve and tylenol as needed for pain.  Ice twice a day for fifteen minutes at a time to help with swelling.   Xray was reassuring, no broken bones.

## 2018-04-03 NOTE — ED Provider Notes (Signed)
[facility] EMERGENCY DEPARTMENT Provider Note   CSN: [id number] Arrival date & time: 04/03/18  1748     History   Chief Complaint Chief Complaint  Patient presents with  . Wrist Injury    HPI Darlene Rivera is a 67 y.o. female.  HPI   Darlene Rivera is a 67yo female with a history of hypertension, hyperlipidemia, type 2 diabetes, GERD, rheumatoid arthritis who presents to the emergency department for evaluation of a left wrist injury.  Patient states that about a week ago she was doing yard work when she accidentally fell forward using her bilateral hands to break her fall.  Ever since she has had aching pain circumferentially over the left wrist, although worse over the dorsal aspect at the joint line.  Pain is worsened with wrist extension and with using the left hand like when she bathes.  She has tried taking aspirin and Tylenol with some mild improvement.  She was concerned because the pain did not improve and want to make sure that there were no fractures.  She denies numbness, weakness, fever, chills, arthralgias elsewhere.  Past Medical History:  Diagnosis Date  . Chest pain   . Diabetes mellitus without complication (HCC)   . GERD (gastroesophageal reflux disease)   . Hyperlipidemia   . Hypertension   . Rheumatoid arthritis(714.0)     Patient Active Problem List   Diagnosis Date Noted  . Carotid artery disease (HCC) 10/29/2015  . Obesity (BMI 35.0-39.9 without comorbidity) 08/12/2015  . Angina pectoris (HCC) 08/11/2015  . Coronary atherosclerosis of native coronary artery 05/31/2013  . Diabetes mellitus with complication (HCC)   . Hypertension   . GERD (gastroesophageal reflux disease)   . Rheumatoid arthritis(714.0)   . Hyperlipidemia     Past Surgical History:  Procedure Laterality Date  . CARDIAC CATHETERIZATION N/A 08/11/2015   Procedure: Left Heart Cath and Coronary Angiography;  Surgeon: Lyn Records, MD;  Location: [facility] INVASIVE CV LAB;   Service: Cardiovascular;  Laterality: N/A;  . CARDIAC CATHETERIZATION N/A 08/11/2015   Procedure: Coronary Stent Intervention;  Surgeon: Lyn Records, MD;  Location: [facility] INVASIVE CV LAB;  Service: Cardiovascular;  Laterality: N/A;  . CARPAL TUNNEL RELEASE Right   . CORONARY ANGIOPLASTY WITH STENT PLACEMENT  2011   DES to the circumflex /notes 08/08/2015  . NEUROPLASTY / TRANSPOSITION MEDIAN NERVE AT CARPAL TUNNEL Left   . ROUX-EN-Y GASTRIC BYPASS  2002  . TONSILLECTOMY       OB History   None      Home Medications    Prior to Admission medications   Medication Sig Start Date End Date Taking? Authorizing Provider  Dapagliflozin-metFORMIN HCl (XIGDUO XR PO) Take by mouth.   Yes [provider]  allopurinol (ZYLOPRIM) 300 MG tablet TAKE HALF A TABLET DAILY 06/16/17   [provider]  aspirin EC 81 MG tablet Take 1 tablet (81 mg total) by mouth daily. 02/04/17   Lyn Records, MD  Calcium Carbonate-Vitamin D (CALCIUM-VITAMIN D) 500-200 MG-UNIT per tablet Take 1 tablet by mouth 2 (two) times daily.     [provider]  cetirizine (ZYRTEC) 10 MG tablet Take 10 mg by mouth at bedtime. 05/18/15   [provider]  cholecalciferol (VITAMIN D) 1000 UNITS tablet Take 2,000 Units by mouth daily.    [provider]  clopidogrel (PLAVIX) 75 MG tablet TAKE 1 TABLET BY MOUTH EVERY DAY 10/30/17   Lyn Records, MD  Cyanocobalamin (VITAMIN B-12 PO)  Take 1 tablet by mouth daily.    [provider]  ferrous fumarate (HEMOCYTE - 106 MG FE) 325 (106 FE) MG TABS tablet Take 1 tablet by mouth daily before lunch.    [provider]  furosemide (LASIX) 40 MG tablet Take 40 mg by mouth daily.    [provider]  ibuprofen (ADVIL,MOTRIN) 400 MG tablet Take 1 tablet (400 mg total) by mouth every 6 (six) hours as needed. 10/23/16   Mathews Robinsons B, PA-C  insulin aspart protamine- aspart (NOVOLOG MIX 70/30) (70-30) 100 UNIT/ML injection Inject  40 Units into the skin daily with breakfast.    [provider]  Insulin Lispro (HUMALOG KWIKPEN [place]) Inject 10 Units into the skin at bedtime.    [provider]  ipratropium (ATROVENT) 0.06 % nasal spray Place 2 sprays into both nostrils daily as needed for rhinitis.    [provider]  isosorbide mononitrate (IMDUR) 30 MG 24 hr tablet TAKE 1 TABLET (30 MG TOTAL) BY MOUTH DAILY. 10/09/17   Lyn Records, MD  metFORMIN (GLUCOPHAGE) 500 MG tablet Take 500 mg by mouth 2 (two) times daily with a meal.    [provider]  metoprolol tartrate (LOPRESSOR) 50 MG tablet Take 1 tablet (50 mg total) by mouth 2 (two) times daily. 11/06/17   Lyn Records, MD  mometasone (NASONEX) 50 MCG/ACT nasal spray Place 2 sprays into the nose daily as needed (for allergies).    [provider]  Multiple Vitamin (MULTIVITAMIN) capsule Take 1 capsule by mouth daily.    [provider]  nitroGLYCERIN (NITROSTAT) 0.4 MG SL tablet Place 1 tablet (0.4 mg total) under the tongue every 5 (five) minutes as needed for chest pain. 09/02/17 12/01/17  Rosalio Macadamia, NP  Omega-3 Fatty Acids (FISH OIL) 1000 MG CAPS Take 2,000 mg by mouth daily.    [provider]  PROAIR RESPICLICK 108 (90 Base) MCG/ACT AEPB Inhale 2 puffs into the lungs every 4 (four) hours as needed. (cough/wheezing) 09/20/15   [provider]  simvastatin (ZOCOR) 20 MG tablet TAKE 1 TABLET BY MOUTH EVERY EVENING 09/11/17   Lyn Records, MD    Family History Family History  Problem Relation Age of Onset  . Diabetes Mother   . Hypertension Mother   . Diabetes Brother   . Hypertension Sister     Social History Social History   Tobacco Use  . Smoking status: Never Smoker  . Smokeless tobacco: Never Used  Substance Use Topics  . Alcohol use: No    Alcohol/week: 0.0 standard drinks  . Drug use: No     Allergies   Sulfa antibiotics   Review of Systems Review of Systems    Constitutional: Negative for chills and fever.  Musculoskeletal: Positive for arthralgias (left wrist) and joint swelling (left wrist).  Skin: Negative for color change and wound.  Neurological: Negative for weakness and numbness.     Physical Exam Updated Vital Signs BP (!) 148/65 (BP Location: Right Arm)   Pulse 85   Resp 18   Ht 5\' 4"  (1.626 m)   Wt 92.5 kg   SpO2 99%   BMI 35.02 kg/m   Physical Exam  Constitutional: She appears well-developed and well-nourished. No distress.  HENT:  Head: Normocephalic and atraumatic.  Eyes: Right eye exhibits no discharge. Left eye exhibits no discharge.  Pulmonary/Chest: Effort normal. No respiratory distress.  Musculoskeletal:  Left wrist appears mildly swollen, no erythema or warmth overlying  the joint. Full wrist ROM, although tender with extension. Diffusely tender to palpation over dorsal aspect of left wrist. No obvious deformity. No snuff box tenderness. Sensation to light touch intact in radian, median, ulnar distribution of left hand.  Radial pulses 2+ and symmetric bilaterally.  Cap refill <2sec.   Neurological: She is alert. Coordination normal.  Skin: Skin is warm and dry. Capillary refill takes less than 2 seconds. She is not diaphoretic.  Psychiatric: She has a normal mood and affect. Her behavior is normal.  Nursing note and vitals reviewed.    ED Treatments / Results  Labs (all labs ordered are listed, but only abnormal results are displayed) Labs Reviewed - No data to display  EKG None  Radiology Dg Wrist Complete Left  Result Date: 04/03/2018 CLINICAL DATA:  Larey Seat 1 week ago EXAM: LEFT WRIST - COMPLETE 3+ VIEW COMPARISON:  None. FINDINGS: No fracture or malalignment. Mild degenerative change at the first [facility] joint. Cyst distal pole of the scaphoid. Vascular calcifications. IMPRESSION: No acute osseous abnormality. Electronically Signed   By: Jasmine Pang M.D.   On: 04/03/2018 18:15    Procedures Procedures  (including critical care time)  Medications Ordered in ED Medications - No data to display   Initial Impression / Assessment and Plan / ED Course  I have reviewed the triage vital signs and the nursing notes.  Pertinent labs & imaging results that were available during my care of the patient were reviewed by me and considered in my medical decision making (see chart for details).     Presents with left wrist injury.  On exam left upper extremity neurovascularly intact.  No erythema, warmth, wound or signs of infectious process.  X-ray left wrist negative for acute fracture or abnormality.  Exam consistent with sprain.  Counseled patient on RICE protocol.  NSAIDs and Tylenol as needed for pain.  Velcro wrist brace applied at the ED.  Final Clinical Impressions(s) / ED Diagnoses   Final diagnoses:  Injury of left wrist, initial encounter    ED Discharge Orders    None       Kellie Shropshire, PA-C 04/03/18 1841    Melene Plan, DO 04/03/18 1942

## 2018-04-03 NOTE — ED Triage Notes (Signed)
She tripped and fell a week ago. Injury to her left wrist.

## 2018-05-26 NOTE — H&P (View-Only) (Signed)
CARDIOLOGY OFFICE NOTE  Date:  05/27/2018    [name] Date of Birth: [birth year] Medical Record #[id number]  PCP:  Dorisann Frames, MD  Cardiologist:  Kyra Manges  Chief Complaint  Patient presents with  . Coronary Artery Disease    Work in visit - seen for Dr. Katrinka Blazing    History of Present Illness: REBBECA CROCCO is a 67 y.o. female who presents today for a work in visit. Seen for Dr. Katrinka Blazing.   She has a history of CAD with remote DESto thecircumflex, prior LAD diagonal bifurcation intervention(07/2015)with provisional stenting of LAD and angioplasty of diagonal with excellent result, hypertension, hyperlipidemia, obesity, PVD with carotid disease and diabetes.  Last seen by Dr. Katrinka Blazing in March of 2018 - felt to be doing ok. I saw her this past March - she was felt to be stable from our standpoint.   Comes in today. Here alone.  She notes that she was downstairs yesterday having mammogram/bone density - thought she should come up and make an appointment. She notes chest pressure and heaviness over the past several months - getting more frequent. Associated with shortness of breath and some diaphoresis. Has not had radiation to jaw/neck like she did in 2017. Her symptoms occur with and without exertion. She has had some fatigue. Has not used NTG. Feels like something is "wrong". Has been successful with losing weight. Trying to eat better. A1C at 7.6 per her report. She is limited in her ability to exercise due to back pain.   Past Medical History:  Diagnosis Date  . Chest pain   . Diabetes mellitus without complication (HCC)   . GERD (gastroesophageal reflux disease)   . Hyperlipidemia   . Hypertension   . Rheumatoid arthritis(714.0)     Past Surgical History:  Procedure Laterality Date  . CARDIAC CATHETERIZATION N/A 08/11/2015   Procedure: Left Heart Cath and Coronary Angiography;  Surgeon: Lyn Records, MD;  Location: [facility] INVASIVE CV LAB;  Service:  Cardiovascular;  Laterality: N/A;  . CARDIAC CATHETERIZATION N/A 08/11/2015   Procedure: Coronary Stent Intervention;  Surgeon: Lyn Records, MD;  Location: [facility] INVASIVE CV LAB;  Service: Cardiovascular;  Laterality: N/A;  . CARPAL TUNNEL RELEASE Right   . CORONARY ANGIOPLASTY WITH STENT PLACEMENT  2011   DES to the circumflex /notes 08/08/2015  . NEUROPLASTY / TRANSPOSITION MEDIAN NERVE AT CARPAL TUNNEL Left   . ROUX-EN-Y GASTRIC BYPASS  2002  . TONSILLECTOMY       Medications: Current Meds  Medication Sig  . allopurinol (ZYLOPRIM) 300 MG tablet TAKE HALF A TABLET DAILY  . aspirin EC 81 MG tablet Take 1 tablet (81 mg total) by mouth daily.  . Calcium Carbonate-Vitamin D (CALCIUM-VITAMIN D) 500-200 MG-UNIT per tablet Take 1 tablet by mouth 2 (two) times daily.   . cetirizine (ZYRTEC) 10 MG tablet Take 10 mg by mouth at bedtime.  . cholecalciferol (VITAMIN D) 1000 UNITS tablet Take 2,000 Units by mouth daily.  . clopidogrel (PLAVIX) 75 MG tablet TAKE 1 TABLET BY MOUTH EVERY DAY  . Cyanocobalamin (VITAMIN B-12 PO) Take 1 tablet by mouth daily.  . Dapagliflozin-metFORMIN HCl (XIGDUO XR PO) Take 1 tablet by mouth 2 (two) times daily.   . ferrous fumarate (HEMOCYTE - 106 MG FE) 325 (106 FE) MG TABS tablet Take 1 tablet by mouth daily before lunch.  . furosemide (LASIX) 40 MG tablet Take 40 mg by mouth daily.  Marland Kitchen ibuprofen (ADVIL,MOTRIN) 400 MG  tablet Take 1 tablet (400 mg total) by mouth every 6 (six) hours as needed.  . insulin aspart protamine- aspart (NOVOLOG MIX 70/30) (70-30) 100 UNIT/ML injection Inject 40 Units into the skin daily with breakfast.  . Insulin Lispro (HUMALOG KWIKPEN [place]) Inject 10 Units into the skin at bedtime.  Marland Kitchen ipratropium (ATROVENT) 0.06 % nasal spray Place 2 sprays into both nostrils daily as needed for rhinitis.  . metoprolol tartrate (LOPRESSOR) 50 MG tablet Take 1 tablet (50 mg total) by mouth 2 (two) times daily.  . mometasone (NASONEX) 50 MCG/ACT nasal spray  Place 2 sprays into the nose daily as needed (for allergies).  . Multiple Vitamin (MULTIVITAMIN) capsule Take 1 capsule by mouth daily.  . nitroGLYCERIN (NITROSTAT) 0.4 MG SL tablet Place 1 tablet (0.4 mg total) under the tongue every 5 (five) minutes as needed for chest pain.  . Omega-3 Fatty Acids (FISH OIL) 1000 MG CAPS Take 2,000 mg by mouth daily.  Marland Kitchen PROAIR RESPICLICK 108 (90 Base) MCG/ACT AEPB Inhale 2 puffs into the lungs every 4 (four) hours as needed. (cough/wheezing)  . simvastatin (ZOCOR) 20 MG tablet TAKE 1 TABLET BY MOUTH EVERY EVENING  . [DISCONTINUED] isosorbide mononitrate (IMDUR) 30 MG 24 hr tablet TAKE 1 TABLET (30 MG TOTAL) BY MOUTH DAILY.     Allergies: Allergies  Allergen Reactions  . Sulfa Antibiotics Nausea Only    Social History: The patient  reports that she has never smoked. She has never used smokeless tobacco. She reports that she does not drink alcohol or use drugs.   Family History: The patient's family history includes Diabetes in her brother and mother; Hypertension in her mother and sister.   Review of Systems: Please see the history of present illness.   Otherwise, the review of systems is positive for none.   All other systems are reviewed and negative.   Physical Exam: VS:  BP 126/80   Pulse 73   Ht 5\' 4"  (1.626 m)   Wt 206 lb (93.4 kg)   SpO2 99%   BMI 35.36 kg/m  .  BMI Body mass index is 35.36 kg/m.  Wt Readings from Last 3 Encounters:  05/27/18 206 lb (93.4 kg)  04/03/18 204 lb (92.5 kg)  09/02/17 217 lb 12.8 oz (98.8 kg)    General: Pleasant. Obese. Alert and in no acute distress.  HEENT: Normal.  Neck: Supple, no JVD, carotid bruits, or masses noted.  Cardiac: Regular rate and rhythm. No murmurs, rubs, or gallops. No edema.  Respiratory:  Lungs are clear to auscultation bilaterally with normal work of breathing.  GI: Soft and nontender.  MS: No deformity or atrophy. Gait and ROM intact.  Skin: Warm and dry. Color is normal.    Neuro:  Strength and sensation are intact and no gross focal deficits noted.  Psych: Alert, appropriate and with normal affect.   LABORATORY DATA:  EKG:  EKG is ordered today. This demonstrates NSR.  Lab Results  Component Value Date   WBC 9.2 08/16/2015   HGB 11.4 (L) 08/16/2015   HCT 34.6 (L) 08/16/2015   PLT 210 08/16/2015   GLUCOSE 172 (H) 11/07/2015   ALT 25 11/07/2015   AST 22 11/07/2015   NA 141 11/07/2015   K 5.3 11/07/2015   CL 109 11/07/2015   CREATININE 0.91 11/07/2015   BUN 19 11/07/2015   CO2 22 11/07/2015   INR 0.95 08/08/2015         BNP (last 3 results) No results for  input(s): BNP in the last 8760 hours.  ProBNP (last 3 results) No results for input(s): PROBNP in the last 8760 hours.   Other Studies Reviewed Today:  Coronary Stent Intervention 07/2015  Left Heart Cath and Coronary Angiography  Conclusion   1. Prox RCA to Dist RCA lesion, 30% stenosed. 2. Dist RCA lesion, 60% stenosed. 3. Ost Cx to Mid Cx lesion, 40% stenosed. The lesion was previously treated with a stent (unknown type). 4. Mid LAD to Dist LAD lesion, 25% stenosed. 5. Prox LAD lesion, 85% stenosed. Post intervention, there is a 15% residual stenosis. 6. Ost 1st Diag lesion, 75% stenosed. Post intervention, there is a 0% residual stenosis.   Continued patency in the proximal circumflex stent.  De novo eccentric 85% proximal/mid LAD and 75% diagonal bifurcation stenosis, [place] 1, 1, 0.  Native right coronary is widely patent with luminal irregularities throughout and 50% distal stenosis.  Successful angioplasty and stenting of the LAD - diagonal bifurcation lesion with placement of LAD DES and angioplasty of diagonal with final angiographic result of 15% LAD stenosis and 0% diagonal stenosis with TIMI grade 3 flow.  Normal left ventricular function   RECOMMENDATIONS:   Anticipate a.m. Discharge  Aspirin 81 mg and Brilinta 90 mg by mouth twice a day. Enrolled in  Twilight study.  Needs f/u with HS in 6 weeks and APP in 1 week.      Assessment/Plan:  1. CAD - prior PCI from 2017 - now with recurring spells of chest pressure/heaviness associated with dyspnea and diaphoresis. EKG ok her today.  Still with multiple CV risk factors. Will increase her Imdur to 60 mg a day. Cardiac cath has been recommended. The patient understands that risks include but are not limited to stroke (1 in 1000), death (1 in 1000), kidney failure [usually temporary] (1 in 500), bleeding (1 in 200), allergic reaction [possibly serious] (1 in 200), and agrees to proceed. Lab today. Arranged for Monday with Dr. [place].   2. PVD - she needs follow up studies in 09/2018.   3. HTN - BP looks fine here today.   4. HLD - she is on statin - last labs from her [facility] noted.   5. Obesity - she is actively losing weight. 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   6. DM - per PCP  7. Chronic back pain - this limits her ability to exercise.    Current medicines are reviewed with the patient today.  The patient does not have concerns regarding medicines other than what has been noted above.  The following changes have been made:  See above.  Labs/ tests ordered today include:    Orders Placed This Encounter  Procedures  . Comprehensive metabolic panel  . CBC  . EKG 12-Lead     Disposition:   FU with me in about a month post cath.  Patient is agreeable to this plan and will call if any problems develop in the interim.   SignedNorma Fredrickson, NP  05/27/2018 8:43 AM  [facility] [address] [place], [place]  [address] Phone: [phone] Fax: [phone]

## 2018-05-26 NOTE — Progress Notes (Signed)
CARDIOLOGY OFFICE NOTE  Date:  05/27/2018    Darlene Rivera Date of Birth: [birth year] Medical Record #[id number]  PCP:  Dorisann Frames, MD  Cardiologist:  Kyra Manges  Chief Complaint  Patient presents with  . Coronary Artery Disease    Work in visit - seen for Dr. Katrinka Blazing    History of Present Illness: Darlene Rivera is a 67 y.o. female who presents today for a work in visit. Seen for Dr. Katrinka Blazing.   She has a history of CAD with remote DESto thecircumflex, prior LAD diagonal bifurcation intervention(07/2015)with provisional stenting of LAD and angioplasty of diagonal with excellent result, hypertension, hyperlipidemia, obesity, PVD with carotid disease and diabetes.  Last seen by Dr. Katrinka Blazing in March of 2018 - felt to be doing ok. I saw her this past March - she was felt to be stable from our standpoint.   Comes in today. Here alone.  She notes that she was downstairs yesterday having mammogram/bone density - thought she should come up and make an appointment. She notes chest pressure and heaviness over the past several months - getting more frequent. Associated with shortness of breath and some diaphoresis. Has not had radiation to jaw/neck like she did in 2017. Her symptoms occur with and without exertion. She has had some fatigue. Has not used NTG. Feels like something is "wrong". Has been successful with losing weight. Trying to eat better. A1C at 7.6 per her report. She is limited in her ability to exercise due to back pain.   Past Medical History:  Diagnosis Date  . Chest pain   . Diabetes mellitus without complication (HCC)   . GERD (gastroesophageal reflux disease)   . Hyperlipidemia   . Hypertension   . Rheumatoid arthritis(714.0)     Past Surgical History:  Procedure Laterality Date  . CARDIAC CATHETERIZATION N/A 08/11/2015   Procedure: Left Heart Cath and Coronary Angiography;  Surgeon: Lyn Records, MD;  Location: [facility] INVASIVE CV LAB;  Service:  Cardiovascular;  Laterality: N/A;  . CARDIAC CATHETERIZATION N/A 08/11/2015   Procedure: Coronary Stent Intervention;  Surgeon: Lyn Records, MD;  Location: [facility] INVASIVE CV LAB;  Service: Cardiovascular;  Laterality: N/A;  . CARPAL TUNNEL RELEASE Right   . CORONARY ANGIOPLASTY WITH STENT PLACEMENT  2011   DES to the circumflex /notes 08/08/2015  . NEUROPLASTY / TRANSPOSITION MEDIAN NERVE AT CARPAL TUNNEL Left   . ROUX-EN-Y GASTRIC BYPASS  2002  . TONSILLECTOMY       Medications: Current Meds  Medication Sig  . allopurinol (ZYLOPRIM) 300 MG tablet TAKE HALF A TABLET DAILY  . aspirin EC 81 MG tablet Take 1 tablet (81 mg total) by mouth daily.  . Calcium Carbonate-Vitamin D (CALCIUM-VITAMIN D) 500-200 MG-UNIT per tablet Take 1 tablet by mouth 2 (two) times daily.   . cetirizine (ZYRTEC) 10 MG tablet Take 10 mg by mouth at bedtime.  . cholecalciferol (VITAMIN D) 1000 UNITS tablet Take 2,000 Units by mouth daily.  . clopidogrel (PLAVIX) 75 MG tablet TAKE 1 TABLET BY MOUTH EVERY DAY  . Cyanocobalamin (VITAMIN B-12 PO) Take 1 tablet by mouth daily.  . Dapagliflozin-metFORMIN HCl (XIGDUO XR PO) Take 1 tablet by mouth 2 (two) times daily.   . ferrous fumarate (HEMOCYTE - 106 MG FE) 325 (106 FE) MG TABS tablet Take 1 tablet by mouth daily before lunch.  . furosemide (LASIX) 40 MG tablet Take 40 mg by mouth daily.  Marland Kitchen ibuprofen (ADVIL,MOTRIN) 400 MG  tablet Take 1 tablet (400 mg total) by mouth every 6 (six) hours as needed.  . insulin aspart protamine- aspart (NOVOLOG MIX 70/30) (70-30) 100 UNIT/ML injection Inject 40 Units into the skin daily with breakfast.  . Insulin Lispro (HUMALOG KWIKPEN [place]) Inject 10 Units into the skin at bedtime.  Marland Kitchen ipratropium (ATROVENT) 0.06 % nasal spray Place 2 sprays into both nostrils daily as needed for rhinitis.  . metoprolol tartrate (LOPRESSOR) 50 MG tablet Take 1 tablet (50 mg total) by mouth 2 (two) times daily.  . mometasone (NASONEX) 50 MCG/ACT nasal spray  Place 2 sprays into the nose daily as needed (for allergies).  . Multiple Vitamin (MULTIVITAMIN) capsule Take 1 capsule by mouth daily.  . nitroGLYCERIN (NITROSTAT) 0.4 MG SL tablet Place 1 tablet (0.4 mg total) under the tongue every 5 (five) minutes as needed for chest pain.  . Omega-3 Fatty Acids (FISH OIL) 1000 MG CAPS Take 2,000 mg by mouth daily.  Marland Kitchen PROAIR RESPICLICK 108 (90 Base) MCG/ACT AEPB Inhale 2 puffs into the lungs every 4 (four) hours as needed. (cough/wheezing)  . simvastatin (ZOCOR) 20 MG tablet TAKE 1 TABLET BY MOUTH EVERY EVENING  . [DISCONTINUED] isosorbide mononitrate (IMDUR) 30 MG 24 hr tablet TAKE 1 TABLET (30 MG TOTAL) BY MOUTH DAILY.     Allergies: Allergies  Allergen Reactions  . Sulfa Antibiotics Nausea Only    Social History: The patient  reports that she has never smoked. She has never used smokeless tobacco. She reports that she does not drink alcohol or use drugs.   Family History: The patient's family history includes Diabetes in her brother and mother; Hypertension in her mother and sister.   Review of Systems: Please see the history of present illness.   Otherwise, the review of systems is positive for none.   All other systems are reviewed and negative.   Physical Exam: VS:  BP 126/80   Pulse 73   Ht 5\' 4"  (1.626 m)   Wt 206 lb (93.4 kg)   SpO2 99%   BMI 35.36 kg/m  .  BMI Body mass index is 35.36 kg/m.  Wt Readings from Last 3 Encounters:  05/27/18 206 lb (93.4 kg)  04/03/18 204 lb (92.5 kg)  09/02/17 217 lb 12.8 oz (98.8 kg)    General: Pleasant. Obese. Alert and in no acute distress.  HEENT: Normal.  Neck: Supple, no JVD, carotid bruits, or masses noted.  Cardiac: Regular rate and rhythm. No murmurs, rubs, or gallops. No edema.  Respiratory:  Lungs are clear to auscultation bilaterally with normal work of breathing.  GI: Soft and nontender.  MS: No deformity or atrophy. Gait and ROM intact.  Skin: Warm and dry. Color is normal.    Neuro:  Strength and sensation are intact and no gross focal deficits noted.  Psych: Alert, appropriate and with normal affect.   LABORATORY DATA:  EKG:  EKG is ordered today. This demonstrates NSR.  Lab Results  Component Value Date   WBC 9.2 08/16/2015   HGB 11.4 (L) 08/16/2015   HCT 34.6 (L) 08/16/2015   PLT 210 08/16/2015   GLUCOSE 172 (H) 11/07/2015   ALT 25 11/07/2015   AST 22 11/07/2015   NA 141 11/07/2015   K 5.3 11/07/2015   CL 109 11/07/2015   CREATININE 0.91 11/07/2015   BUN 19 11/07/2015   CO2 22 11/07/2015   INR 0.95 08/08/2015         BNP (last 3 results) No results for  input(s): BNP in the last 8760 hours.  ProBNP (last 3 results) No results for input(s): PROBNP in the last 8760 hours.   Other Studies Reviewed Today:  Coronary Stent Intervention 07/2015  Left Heart Cath and Coronary Angiography  Conclusion   1. Prox RCA to Dist RCA lesion, 30% stenosed. 2. Dist RCA lesion, 60% stenosed. 3. Ost Cx to Mid Cx lesion, 40% stenosed. The lesion was previously treated with a stent (unknown type). 4. Mid LAD to Dist LAD lesion, 25% stenosed. 5. Prox LAD lesion, 85% stenosed. Post intervention, there is a 15% residual stenosis. 6. Ost 1st Diag lesion, 75% stenosed. Post intervention, there is a 0% residual stenosis.   Continued patency in the proximal circumflex stent.  De novo eccentric 85% proximal/mid LAD and 75% diagonal bifurcation stenosis, [place] 1, 1, 0.  Native right coronary is widely patent with luminal irregularities throughout and 50% distal stenosis.  Successful angioplasty and stenting of the LAD - diagonal bifurcation lesion with placement of LAD DES and angioplasty of diagonal with final angiographic result of 15% LAD stenosis and 0% diagonal stenosis with TIMI grade 3 flow.  Normal left ventricular function   RECOMMENDATIONS:   Anticipate a.m. Discharge  Aspirin 81 mg and Brilinta 90 mg by mouth twice a day. Enrolled in  Twilight study.  Needs f/u with HS in 6 weeks and APP in 1 week.      Assessment/Plan:  1. CAD - prior PCI from 2017 - now with recurring spells of chest pressure/heaviness associated with dyspnea and diaphoresis. EKG ok her today.  Still with multiple CV risk factors. Will increase her Imdur to 60 mg a day. Cardiac cath has been recommended. The patient understands that risks include but are not limited to stroke (1 in 1000), death (1 in 1000), kidney failure [usually temporary] (1 in 500), bleeding (1 in 200), allergic reaction [possibly serious] (1 in 200), and agrees to proceed. Lab today. Arranged for Monday with Dr. [place].   2. PVD - she needs follow up studies in 09/2018.   3. HTN - BP looks fine here today.   4. HLD - she is on statin - last labs from her [facility] noted.   5. Obesity - she is actively losing weight. 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   6. DM - per PCP  7. Chronic back pain - this limits her ability to exercise.    Current medicines are reviewed with the patient today.  The patient does not have concerns regarding medicines other than what has been noted above.  The following changes have been made:  See above.  Labs/ tests ordered today include:    Orders Placed This Encounter  Procedures  . Comprehensive metabolic panel  . CBC  . EKG 12-Lead     Disposition:   FU with me in about a month post cath.  Patient is agreeable to this plan and will call if any problems develop in the interim.   SignedNorma Fredrickson, NP  05/27/2018 8:43 AM  [facility] [address] [place], [place]  [address] Phone: [phone] Fax: [phone]

## 2018-05-27 ENCOUNTER — Encounter: Payer: Self-pay | Admitting: Nurse Practitioner

## 2018-05-27 ENCOUNTER — Ambulatory Visit (INDEPENDENT_AMBULATORY_CARE_PROVIDER_SITE_OTHER): Payer: Managed Care, Other (non HMO) | Admitting: Nurse Practitioner

## 2018-05-27 ENCOUNTER — Telehealth: Payer: Self-pay | Admitting: *Deleted

## 2018-05-27 VITALS — BP 126/80 | HR 73 | Ht 64.0 in | Wt 206.0 lb

## 2018-05-27 DIAGNOSIS — I10 Essential (primary) hypertension: Secondary | ICD-10-CM | POA: Diagnosis not present

## 2018-05-27 DIAGNOSIS — E7849 Other hyperlipidemia: Secondary | ICD-10-CM | POA: Diagnosis not present

## 2018-05-27 DIAGNOSIS — I259 Chronic ischemic heart disease, unspecified: Secondary | ICD-10-CM

## 2018-05-27 LAB — CBC
Hematocrit: 39.3 % (ref 34.0–46.6)
Hemoglobin: 13.2 g/dL (ref 11.1–15.9)
MCH: 28.9 pg (ref 26.6–33.0)
MCHC: 33.6 g/dL (ref 31.5–35.7)
MCV: 86 fL (ref 79–97)
Platelets: 209 10*3/uL (ref 150–450)
RBC: 4.57 x10E6/uL (ref 3.77–5.28)
RDW: 13.1 % (ref 12.3–15.4)
WBC: 9.1 10*3/uL (ref 3.4–10.8)

## 2018-05-27 LAB — COMPREHENSIVE METABOLIC PANEL
ALT: 28 IU/L (ref 0–32)
AST: 32 IU/L (ref 0–40)
Albumin/Globulin Ratio: 1.8 (ref 1.2–2.2)
Albumin: 4.5 g/dL (ref 3.6–4.8)
Alkaline Phosphatase: 135 IU/L — ABNORMAL HIGH (ref 39–117)
BUN/Creatinine Ratio: 14 (ref 12–28)
BUN: 13 mg/dL (ref 8–27)
Bilirubin Total: 0.4 mg/dL (ref 0.0–1.2)
CO2: 24 mmol/L (ref 20–29)
Calcium: 9.2 mg/dL (ref 8.7–10.3)
Chloride: 102 mmol/L (ref 96–106)
Creatinine, Ser: 0.95 mg/dL (ref 0.57–1.00)
GFR calc Af Amer: 72 mL/min/{1.73_m2} (ref >59)
GFR calc non Af Amer: 62 mL/min/{1.73_m2} (ref >59)
Globulin, Total: 2.5 g/dL (ref 1.5–4.5)
Glucose: 90 mg/dL (ref 65–99)
Potassium: 3 mmol/L — ABNORMAL LOW (ref 3.5–5.2)
Sodium: 145 mmol/L — ABNORMAL HIGH (ref 134–144)
Total Protein: 7 g/dL (ref 6.0–8.5)

## 2018-05-27 MED ORDER — POTASSIUM CHLORIDE CRYS ER 20 MEQ PO TBCR: EXTENDED_RELEASE_TABLET | ORAL | 1 refills | Status: DC

## 2018-05-27 MED ORDER — ISOSORBIDE MONONITRATE ER 60 MG PO TB24: 60.0000 mg | ORAL_TABLET | Freq: Every day | ORAL | 3 refills | Status: DC

## 2018-05-27 NOTE — Telephone Encounter (Signed)
Notes recorded by Baird Lyons, RN on 05/27/2018 at 5:35 PM EST K+ 3.0 -- reviewed w/ DOD, Dr. Eldridge Dace. Orders: Take K+ 40 mEq x 2 days, then reduce to 20 mEq once daily. BMET in 1 week. Pt is scheduled for cath on Monday, so did not order BMET since she will have checked prior to cath d/t abn lab result today. . Will leave it to Lawson Fiscal if she would like to have repeat lab work and pt understands office will call if she would like to have additional testing.  Pt is agreeable to plan.

## 2018-05-27 NOTE — Patient Instructions (Addendum)
We will be checking the following labs today - CMET & CBC    Medication Instructions:    Continue with your current medicines. BUT  I am increasing the Imdur to 60 mg a day - this has been sent to your pharmacy - you can take 2 of your 30 mg tablets to use up   If you need a refill on your cardiac medications before your next appointment, please call your pharmacy.     Testing/Procedures To Be Arranged:  Cardiac catheterization  Follow-Up:   See me in about a month    At [facility], you and your health needs are our priority.  As part of our continuing mission to provide you with exceptional heart care, we have created designated Provider Care Teams.  These Care Teams include your primary Cardiologist (physician) and Advanced Practice Providers (APPs -  Physician Assistants and Nurse Practitioners) who all work together to provide you with the care you need, when you need it.  Special Instructions:  Your provider has recommended a cardiac catherization  You are scheduled for a cardiac catheterization on Monday, December 16th at 7:30AM with Dr. Swaziland or associate.  Please arrive at the [facility] (Main Entrance) at [facility] at [address], [place] -  2nd Floor Short Stay on Monday, December 16th at 5:30AM.    Special note: Every effort is made to have your procedure done on time.   Please understand that emergencies sometimes delay a scheduled   procedure.  No food or drink after midnight on Sunday  You may take your morning medications with a sip of water on the day of your procedure.  Please take a baby aspirin (81 mg) on the morning of your procedure.   Medications to HOLD - Oral diabetic Caprice Kluver) medicine the day of the procedure and 48 hours afterwards Take 1/2 dose insulin on Sunday night No insulin on Monday morning  Plan for a one night stay -- bring personal belongings.  Bring a current list of your medications and current insurance  cards.  You MUST have a responsible person to drive you home. Someone MUST be with you the first 24 hours after you arrive home or your discharge will be delayed. Wear clothes that are easy to get on and off and wear slip on shoes.    Coronary Angiogram A coronary angiogram, also called coronary angiography, is an X-ray procedure used to look at the arteries in the heart. In this procedure, a dye (contrast dye) is injected through a long, hollow tube (catheter). The catheter is about the size of a piece of cooked spaghetti and is inserted through your groin, wrist, or arm. The dye is injected into each artery, and X-rays are then taken to show if there is a blockage in the arteries of your heart.  LET [facility] CARE PROVIDER KNOW ABOUT: Any allergies you have, including allergies to shellfish or contrast dye.   All medicines you are taking, including vitamins, herbs, eye drops, creams, and over-the-counter medicines.   Previous problems you or members of your family have had with the use of anesthetics.   Any blood disorders you have.   Previous surgeries you have had. History of kidney problems or failure.   Other medical conditions you have.  RISKS AND COMPLICATIONS  Generally, a coronary angiogram is a safe procedure. However, about 1 person out of 1000 can have problems that may include: Allergic reaction to the dye. Bleeding/bruising from the access  site or other locations. Kidney injury, especially in people with impaired kidney function.  Stroke (rare). Heart attack (rare). Irregular rhythms (rare) Death (rare)  BEFORE THE PROCEDURE  Do not eat or drink anything after midnight the night before the procedure or as directed by your health care provider.   Ask your health care provider about changing or stopping your regular medicines. This is especially important if you are taking diabetes medicines or blood thinners.  PROCEDURE You may be given a medicine to help you relax  (sedative) before the procedure. This medicine is given through an intravenous (IV) access tube that is inserted into one of your veins.   The area where the catheter will be inserted will be washed and shaved. This is usually done in the groin but may be done in the fold of your arm (near your elbow) or in the wrist.    A medicine will be given to numb the area where the catheter will be inserted (local anesthetic).   The health care provider will insert the catheter into an artery. The catheter will be guided by using a special type of X-ray (fluoroscopy) of the blood vessel being examined.   A special dye will then be injected into the catheter, and X-rays will be taken. The dye will help to show where any narrowing or blockages are located in the heart arteries.     AFTER THE PROCEDURE  If the procedure is done through the leg, you will be kept in bed lying flat for several hours. You will be instructed to not bend or cross your legs. The insertion site will be checked frequently.   The pulse in your feet or wrist will be checked frequently.   Additional blood tests, X-rays, and an electrocardiogram may be done.     Call the [facility] office at [phone] if you have any questions, problems or concerns.

## 2018-05-28 ENCOUNTER — Other Ambulatory Visit: Payer: Self-pay | Admitting: Nurse Practitioner

## 2018-05-28 NOTE — Telephone Encounter (Signed)
Will recheck BMET on AM of cath - this has been ordered.   Rosalio Macadamia, RN, ANP-C [facility] [address] [place], [place]  [address] [phone]

## 2018-05-28 NOTE — Progress Notes (Signed)
Will recheck BMET AM of cath.   Rosalio Macadamia, RN, ANP-C [facility] [address] [place], [place]  [address] [phone]

## 2018-05-29 NOTE — Telephone Encounter (Signed)
° °  Patient states she is returning call. Please call, ok to leave detailed message

## 2018-05-29 NOTE — Telephone Encounter (Signed)
lvm per pt that labs would be repeated in the cath lab on day of procedure.

## 2018-06-01 ENCOUNTER — Encounter (HOSPITAL_COMMUNITY)
Admission: RE | Disposition: A | Discharge status: Home / Self Care | Payer: Self-pay | Source: Home / Self Care | Attending: Cardiology

## 2018-06-01 ENCOUNTER — Encounter (HOSPITAL_COMMUNITY): Payer: Self-pay | Admitting: Cardiology

## 2018-06-01 ENCOUNTER — Ambulatory Visit (HOSPITAL_COMMUNITY)
Admission: RE | Admit: 2018-06-01 | Discharge: 2018-06-01 | Disposition: A | Discharge status: Home / Self Care | Payer: Managed Care, Other (non HMO) | Attending: Cardiology | Admitting: Cardiology

## 2018-06-01 DIAGNOSIS — Z79899 Other long term (current) drug therapy: Secondary | ICD-10-CM | POA: Diagnosis not present

## 2018-06-01 DIAGNOSIS — K219 Gastro-esophageal reflux disease without esophagitis: Secondary | ICD-10-CM | POA: Insufficient documentation

## 2018-06-01 DIAGNOSIS — E669 Obesity, unspecified: Secondary | ICD-10-CM | POA: Insufficient documentation

## 2018-06-01 DIAGNOSIS — I209 Angina pectoris, unspecified: Secondary | ICD-10-CM | POA: Diagnosis present

## 2018-06-01 DIAGNOSIS — Z882 Allergy status to sulfonamides status: Secondary | ICD-10-CM | POA: Diagnosis not present

## 2018-06-01 DIAGNOSIS — Z8249 Family history of ischemic heart disease and other diseases of the circulatory system: Secondary | ICD-10-CM | POA: Insufficient documentation

## 2018-06-01 DIAGNOSIS — Z6835 Body mass index (BMI) 35.0-35.9, adult: Secondary | ICD-10-CM | POA: Diagnosis not present

## 2018-06-01 DIAGNOSIS — I2584 Coronary atherosclerosis due to calcified coronary lesion: Secondary | ICD-10-CM | POA: Insufficient documentation

## 2018-06-01 DIAGNOSIS — M069 Rheumatoid arthritis, unspecified: Secondary | ICD-10-CM | POA: Diagnosis not present

## 2018-06-01 DIAGNOSIS — Z833 Family history of diabetes mellitus: Secondary | ICD-10-CM | POA: Diagnosis not present

## 2018-06-01 DIAGNOSIS — Z794 Long term (current) use of insulin: Secondary | ICD-10-CM | POA: Insufficient documentation

## 2018-06-01 DIAGNOSIS — E785 Hyperlipidemia, unspecified: Secondary | ICD-10-CM | POA: Diagnosis not present

## 2018-06-01 DIAGNOSIS — E118 Type 2 diabetes mellitus with unspecified complications: Secondary | ICD-10-CM | POA: Diagnosis present

## 2018-06-01 DIAGNOSIS — Z9884 Bariatric surgery status: Secondary | ICD-10-CM | POA: Diagnosis not present

## 2018-06-01 DIAGNOSIS — Z955 Presence of coronary angioplasty implant and graft: Secondary | ICD-10-CM | POA: Insufficient documentation

## 2018-06-01 DIAGNOSIS — Z8673 Personal history of transient ischemic attack (TIA), and cerebral infarction without residual deficits: Secondary | ICD-10-CM | POA: Diagnosis not present

## 2018-06-01 DIAGNOSIS — I10 Essential (primary) hypertension: Secondary | ICD-10-CM | POA: Diagnosis present

## 2018-06-01 DIAGNOSIS — E1151 Type 2 diabetes mellitus with diabetic peripheral angiopathy without gangrene: Secondary | ICD-10-CM | POA: Insufficient documentation

## 2018-06-01 DIAGNOSIS — R0602 Shortness of breath: Secondary | ICD-10-CM | POA: Diagnosis not present

## 2018-06-01 DIAGNOSIS — I25119 Atherosclerotic heart disease of native coronary artery with unspecified angina pectoris: Secondary | ICD-10-CM | POA: Insufficient documentation

## 2018-06-01 DIAGNOSIS — Z7982 Long term (current) use of aspirin: Secondary | ICD-10-CM | POA: Insufficient documentation

## 2018-06-01 DIAGNOSIS — I259 Chronic ischemic heart disease, unspecified: Secondary | ICD-10-CM

## 2018-06-01 HISTORY — PX: LEFT HEART CATH AND CORONARY ANGIOGRAPHY: CATH118249

## 2018-06-01 LAB — GLUCOSE, CAPILLARY
Glucose-Capillary: 56 mg/dL — ABNORMAL LOW (ref 70–99)
Glucose-Capillary: 112 mg/dL — ABNORMAL HIGH (ref 70–99)
Glucose-Capillary: 69 mg/dL — ABNORMAL LOW (ref 70–99)
Glucose-Capillary: 75 mg/dL (ref 70–99)
Glucose-Capillary: 84 mg/dL (ref 70–99)
Glucose-Capillary: 141 mg/dL — ABNORMAL HIGH (ref 70–99)

## 2018-06-01 LAB — BASIC METABOLIC PANEL
Anion gap: 15 (ref 5–15)
BUN: 14 mg/dL (ref 8–23)
CO2: 25 mmol/L (ref 22–32)
Calcium: 9.1 mg/dL (ref 8.9–10.3)
Chloride: 103 mmol/L (ref 98–111)
Creatinine, Ser: 0.88 mg/dL (ref 0.44–1.00)
GFR calc Af Amer: >60 mL/min (ref >60)
GFR calc non Af Amer: >60 mL/min (ref >60)
Glucose, Bld: 78 mg/dL (ref 70–99)
Potassium: 2.8 mmol/L — ABNORMAL LOW (ref 3.5–5.1)
Sodium: 143 mmol/L (ref 135–145)

## 2018-06-01 SURGERY — LEFT HEART CATH AND CORONARY ANGIOGRAPHY
Anesthesia: LOCAL

## 2018-06-01 MED ORDER — DEXTROSE 50 % IV SOLN
12.5000 g | Freq: Once | INTRAVENOUS | Status: AC
  Administered 2018-06-01: 12.5 g via INTRAVENOUS

## 2018-06-01 MED ORDER — SODIUM CHLORIDE 0.9% FLUSH: 3.0000 mL | INTRAVENOUS | Status: DC | PRN

## 2018-06-01 MED ORDER — DIAZEPAM 5 MG PO TABS
ORAL_TABLET | ORAL | Status: AC
  Filled 2018-06-01: qty 2

## 2018-06-01 MED ORDER — SODIUM CHLORIDE 0.9 % IV SOLN: 250.0000 mL | INTRAVENOUS | Status: DC | PRN

## 2018-06-01 MED ORDER — FENTANYL CITRATE (PF) 100 MCG/2ML IJ SOLN
INTRAMUSCULAR | Status: DC | PRN
  Administered 2018-06-01: 25 ug via INTRAVENOUS

## 2018-06-01 MED ORDER — HEPARIN SODIUM (PORCINE) 1000 UNIT/ML IJ SOLN
INTRAMUSCULAR | Status: AC
  Filled 2018-06-01: qty 1

## 2018-06-01 MED ORDER — ACETAMINOPHEN 325 MG PO TABS: 650.0000 mg | ORAL_TABLET | ORAL | Status: DC | PRN

## 2018-06-01 MED ORDER — MIDAZOLAM HCL 2 MG/2ML IJ SOLN
INTRAMUSCULAR | Status: DC | PRN
  Administered 2018-06-01: 1 mg via INTRAVENOUS

## 2018-06-01 MED ORDER — LIDOCAINE HCL (PF) 1 % IJ SOLN
INTRAMUSCULAR | Status: AC
  Filled 2018-06-01: qty 30

## 2018-06-01 MED ORDER — MIDAZOLAM HCL 2 MG/2ML IJ SOLN
INTRAMUSCULAR | Status: AC
  Filled 2018-06-01: qty 2

## 2018-06-01 MED ORDER — SODIUM CHLORIDE 0.9% FLUSH: 3.0000 mL | Freq: Two times a day (BID) | INTRAVENOUS | Status: DC

## 2018-06-01 MED ORDER — SODIUM CHLORIDE 0.9 % IV SOLN
INTRAVENOUS | Status: DC
  Administered 2018-06-01: 06:00:00 via INTRAVENOUS

## 2018-06-01 MED ORDER — POTASSIUM CHLORIDE CRYS ER 20 MEQ PO TBCR
40.0000 meq | EXTENDED_RELEASE_TABLET | Freq: Once | ORAL | Status: AC
  Administered 2018-06-01: 40 meq via ORAL
  Filled 2018-06-01: qty 2

## 2018-06-01 MED ORDER — ASPIRIN 81 MG PO CHEW
81.0000 mg | CHEWABLE_TABLET | ORAL | Status: AC
  Administered 2018-06-01: 81 mg via ORAL
  Filled 2018-06-01: qty 1

## 2018-06-01 MED ORDER — DAPAGLIFLOZIN PRO-METFORMIN ER 5-1000 MG PO TB24: 1.0000 | ORAL_TABLET | Freq: Two times a day (BID) | ORAL | Status: DC

## 2018-06-01 MED ORDER — HEPARIN (PORCINE) IN NACL 1000-0.9 UT/500ML-% IV SOLN
INTRAVENOUS | Status: AC
  Filled 2018-06-01: qty 1000

## 2018-06-01 MED ORDER — HEPARIN (PORCINE) IN NACL 1000-0.9 UT/500ML-% IV SOLN
INTRAVENOUS | Status: DC | PRN
  Administered 2018-06-01 (×2): 500 mL

## 2018-06-01 MED ORDER — FENTANYL CITRATE (PF) 100 MCG/2ML IJ SOLN
INTRAMUSCULAR | Status: AC
  Filled 2018-06-01: qty 2

## 2018-06-01 MED ORDER — CLOPIDOGREL BISULFATE 75 MG PO TABS
75.0000 mg | ORAL_TABLET | Freq: Once | ORAL | Status: AC
  Administered 2018-06-01: 75 mg via ORAL

## 2018-06-01 MED ORDER — DIAZEPAM 5 MG PO TABS: 10.0000 mg | ORAL_TABLET | ORAL | Status: DC

## 2018-06-01 MED ORDER — POTASSIUM CHLORIDE CRYS ER 20 MEQ PO TBCR: 40.0000 meq | EXTENDED_RELEASE_TABLET | Freq: Every day | ORAL | 11 refills | Status: DC

## 2018-06-01 MED ORDER — POTASSIUM CHLORIDE CRYS ER 20 MEQ PO TBCR
EXTENDED_RELEASE_TABLET | ORAL | Status: AC
  Filled 2018-06-01: qty 2

## 2018-06-01 MED ORDER — SODIUM CHLORIDE 0.9 % WEIGHT BASED INFUSION: 1.0000 mL/kg/h | INTRAVENOUS | Status: DC

## 2018-06-01 MED ORDER — DEXTROSE 50 % IV SOLN
INTRAVENOUS | Status: AC
  Filled 2018-06-01: qty 50

## 2018-06-01 MED ORDER — CLOPIDOGREL BISULFATE 75 MG PO TABS
ORAL_TABLET | ORAL | Status: AC
  Filled 2018-06-01: qty 1

## 2018-06-01 MED ORDER — VERAPAMIL HCL 2.5 MG/ML IV SOLN
INTRAVENOUS | Status: AC
  Filled 2018-06-01: qty 2

## 2018-06-01 MED ORDER — HEPARIN SODIUM (PORCINE) 1000 UNIT/ML IJ SOLN
INTRAMUSCULAR | Status: DC | PRN
  Administered 2018-06-01: 4500 [IU] via INTRAVENOUS

## 2018-06-01 MED ORDER — LIDOCAINE HCL (PF) 1 % IJ SOLN
INTRAMUSCULAR | Status: DC | PRN
  Administered 2018-06-01: 2 mL

## 2018-06-01 MED ORDER — IOHEXOL 350 MG/ML SOLN
INTRAVENOUS | Status: DC | PRN
  Administered 2018-06-01: 80 mL via INTRA_ARTERIAL

## 2018-06-01 MED ORDER — VERAPAMIL HCL 2.5 MG/ML IV SOLN
INTRAVENOUS | Status: DC | PRN
  Administered 2018-06-01: 10 mL via INTRA_ARTERIAL

## 2018-06-01 MED ORDER — ONDANSETRON HCL 4 MG/2ML IJ SOLN: 4.0000 mg | Freq: Four times a day (QID) | INTRAMUSCULAR | Status: DC | PRN

## 2018-06-01 SURGICAL SUPPLY — 12 items
CATH 5FR JL3.5 JR4 ANG PIG MP (CATHETERS) ×1 IMPLANT
DEVICE RAD COMP TR BAND LRG (VASCULAR PRODUCTS) ×1 IMPLANT
GLIDESHEATH SLEND SS 6F .021 (SHEATH) ×1 IMPLANT
GUIDEWIRE INQWIRE 1.5J.035X260 (WIRE) IMPLANT
INQWIRE 1.5J .035X260CM (WIRE) ×2
KIT HEART LEFT (KITS) ×2 IMPLANT
PACK CARDIAC CATHETERIZATION (CUSTOM PROCEDURE TRAY) ×2 IMPLANT
SHEATH PROBE COVER 6X72 (BAG) ×1 IMPLANT
SYR MEDRAD MARK 7 150ML (SYRINGE) ×2 IMPLANT
TRANSDUCER W/STOPCOCK (MISCELLANEOUS) ×2 IMPLANT
TUBING CIL FLEX 10 FLL-RA (TUBING) ×2 IMPLANT
WIRE HI TORQ VERSACORE-J 145CM (WIRE) ×1 IMPLANT

## 2018-06-01 NOTE — Progress Notes (Signed)
Pt states she is not anxious and does not need or want the valium due to her grogginess already. We are out of Plavix in our [facility]. I have called cath lab and the nurse stated they will give it over there when pt arrives.

## 2018-06-01 NOTE — Discharge Instructions (Signed)

## 2018-06-01 NOTE — Progress Notes (Signed)
Pt states is feeling groggy. CBG this morning is 56. Dextrose 12.5 gm given per protocol at 0610.Will recheck prior to going to cath lab

## 2018-06-01 NOTE — Interval H&P Note (Signed)
History and Physical Interval Note:  06/01/2018 7:17 AM  Darlene Rivera  has presented today for surgery, with the diagnosis of cp - sob  The various methods of treatment have been discussed with the patient and family. After consideration of risks, benefits and other options for treatment, the patient has consented to  Procedure(s): LEFT HEART CATH AND CORONARY ANGIOGRAPHY (N/A) as a surgical intervention .  The patient's history has been reviewed, patient examined, no change in status, stable for surgery.  I have reviewed the patient's chart and labs.  Questions were answered to the patient's satisfaction.   Cath Lab Visit (complete for each Cath Lab visit)  Clinical Evaluation Leading to the Procedure:   ACS: No.  Non-ACS:    Anginal Classification: CCS III  Anti-ischemic medical therapy: Maximal Therapy (2 or more classes of medications)  Non-Invasive Test Results: No non-invasive testing performed  Prior CABG: No previous CABG       Theron Arista [facility] 06/01/2018 7:18 AM

## 2018-06-18 ENCOUNTER — Other Ambulatory Visit: Payer: Self-pay | Admitting: Interventional Cardiology

## 2018-06-23 NOTE — Progress Notes (Signed)
CARDIOLOGY OFFICE NOTE  Date:  06/24/2018    Darlene Rivera Date of Birth: 1950-06-20 Medical Record #[id number]  PCP:  Dorisann Frames, MD  Cardiologist:  Kyra Manges    Chief Complaint  Patient presents with  . Follow-up    Post cath visit - seen for Dr. Katrinka Blazing    History of Present Illness: Darlene Rivera is a 68 y.o. female who presents today for a one month check. Seen for Dr. Katrinka Blazing.   She has a history ofCAD with remote DESto thecircumflex,priorLAD diagonal bifurcation intervention(07/2015)with provisional stenting of LAD and angioplasty of diagonal with excellent result, hypertension, hyperlipidemia,obesity, PVD with carotid diseaseand diabetes.  Last seen by Dr. Katrinka Blazing in March of 2018 - felt to be doing ok. I saw her in March of 2019 and she was felt to be doing ok.   Last seen in December - she endorsed more chest pressure and shortness of breath with and without exertion.  I referred her on for repeat cath.   Comes in today. Here alone. She is doing well. Has had a recent cold that she is getting over - has been eating more soup (with salt) due to her cold. No more chest pain and shortness of breath. Cath findings reviewed with her. She is asking about potassium - needs to be rechecked and refilled today. She is otherwise doing well. No problems with her cath site.   Past Medical History:  Diagnosis Date  . Chest pain   . Diabetes mellitus without complication (HCC)   . GERD (gastroesophageal reflux disease)   . Hyperlipidemia   . Hypertension   . Rheumatoid arthritis(714.0)     Past Surgical History:  Procedure Laterality Date  . CARDIAC CATHETERIZATION N/A 08/11/2015   Procedure: Left Heart Cath and Coronary Angiography;  Surgeon: Lyn Records, MD;  Location: [facility] INVASIVE CV LAB;  Service: Cardiovascular;  Laterality: N/A;  . CARDIAC CATHETERIZATION N/A 08/11/2015   Procedure: Coronary Stent Intervention;  Surgeon: Lyn Records, MD;   Location: [facility] INVASIVE CV LAB;  Service: Cardiovascular;  Laterality: N/A;  . CARPAL TUNNEL RELEASE Right   . CORONARY ANGIOPLASTY WITH STENT PLACEMENT  2011   DES to the circumflex /notes 08/08/2015  . LEFT HEART CATH AND CORONARY ANGIOGRAPHY N/A 06/01/2018   Procedure: LEFT HEART CATH AND CORONARY ANGIOGRAPHY;  Surgeon: Swaziland, Peter M, MD;  Location: [facility] INVASIVE CV LAB;  Service: Cardiovascular;  Laterality: N/A;  . NEUROPLASTY / TRANSPOSITION MEDIAN NERVE AT CARPAL TUNNEL Left   . ROUX-EN-Y GASTRIC BYPASS  2002  . TONSILLECTOMY       Medications: Current Meds  Medication Sig  . allopurinol (ZYLOPRIM) 300 MG tablet Take 150 mg by mouth daily.   Marland Kitchen aspirin EC 81 MG tablet Take 1 tablet (81 mg total) by mouth daily.  . Calcium Carbonate-Vitamin D (CALCIUM-VITAMIN D) 500-200 MG-UNIT per tablet Take 1 tablet by mouth 2 (two) times daily.   . cetirizine (ZYRTEC) 10 MG tablet Take 10 mg by mouth at bedtime.  . cholecalciferol (VITAMIN D) 1000 UNITS tablet Take 2,000 Units by mouth daily.  . clopidogrel (PLAVIX) 75 MG tablet TAKE 1 TABLET BY MOUTH EVERY DAY  . Cyanocobalamin (VITAMIN B-12 PO) Take 1 tablet by mouth daily.  . Dapagliflozin-metFORMIN HCl ER (XIGDUO XR) 10-998 MG TB24 Take 1 tablet by mouth 2 (two) times daily.  . ferrous fumarate (HEMOCYTE - 106 MG FE) 325 (106 FE) MG TABS tablet Take 1 tablet by mouth  daily before lunch.  . furosemide (LASIX) 40 MG tablet Take 40 mg by mouth daily.  Marland Kitchen ibuprofen (ADVIL,MOTRIN) 400 MG tablet Take 1 tablet (400 mg total) by mouth every 6 (six) hours as needed.  . insulin aspart protamine- aspart (NOVOLOG MIX 70/30) (70-30) 100 UNIT/ML injection Inject 40 Units into the skin daily with breakfast.  . Insulin Lispro (HUMALOG KWIKPEN [place]) Inject 10 Units into the skin at bedtime.  Marland Kitchen ipratropium (ATROVENT) 0.06 % nasal spray Place 2 sprays into both nostrils daily as needed for rhinitis.  Marland Kitchen isosorbide mononitrate (IMDUR) 60 MG 24 hr tablet Take 1 tablet  (60 mg total) by mouth daily.  . metoprolol tartrate (LOPRESSOR) 50 MG tablet Take 1 tablet (50 mg total) by mouth 2 (two) times daily.  . mometasone (NASONEX) 50 MCG/ACT nasal spray Place 2 sprays into the nose daily as needed (for allergies).  . Multiple Vitamin (MULTIVITAMIN) capsule Take 1 capsule by mouth daily.  . nitroGLYCERIN (NITROSTAT) 0.4 MG SL tablet Place 1 tablet (0.4 mg total) under the tongue every 5 (five) minutes as needed for chest pain.  . Omega-3 Fatty Acids (FISH OIL) 1000 MG CAPS Take 2,000 mg by mouth daily.  . potassium chloride SA (KLOR-CON M20) 20 MEQ tablet Take 1 tablet (20 mEq total) by mouth daily.  Marland Kitchen PROAIR RESPICLICK 108 (90 Base) MCG/ACT AEPB Inhale 2 puffs into the lungs every 4 (four) hours as needed. (cough/wheezing)  . simvastatin (ZOCOR) 20 MG tablet TAKE 1 TABLET BY MOUTH EVERY EVENING  . [DISCONTINUED] potassium chloride SA (KLOR-CON M20) 20 MEQ tablet Take 1 tablet (20 mEq total) by mouth daily.     Allergies: Allergies  Allergen Reactions  . Sulfa Antibiotics Nausea Only    Social History: The patient  reports that she has never smoked. She has never used smokeless tobacco. She reports that she does not drink alcohol or use drugs.   Family History: The patient's family history includes Diabetes in her brother and mother; Hypertension in her mother and sister.   Review of Systems: Please see the history of present illness.   Otherwise, the review of systems is positive for none.   All other systems are reviewed and negative.   Physical Exam: VS:  BP 140/72 (BP Location: Left Arm, Patient Position: Sitting, Cuff Size: Normal)   Pulse 67   Ht 5\' 4"  (1.626 m)   Wt 204 lb 12.8 oz (92.9 kg)   SpO2 100% Comment: at rest  BMI 35.15 kg/m  .  BMI Body mass index is 35.15 kg/m.  Wt Readings from Last 3 Encounters:  06/24/18 204 lb 12.8 oz (92.9 kg)  06/01/18 204 lb (92.5 kg)  05/27/18 206 lb (93.4 kg)    General: Pleasant. Well developed,  well nourished and in no acute distress.   HEENT: Normal.  Neck: Supple, no JVD, carotid bruits, or masses noted.  Cardiac: Regular rate and rhythm. No murmurs, rubs, or gallops. No edema.  Respiratory:  Lungs are clear to auscultation bilaterally with normal work of breathing.  GI: Soft and nontender.  MS: No deformity or atrophy. Gait and ROM intact.  Skin: Warm and dry. Color is normal.  Neuro:  Strength and sensation are intact and no gross focal deficits noted.  Psych: Alert, appropriate and with normal affect.   LABORATORY DATA:  EKG:  EKG is not ordered today.  Lab Results  Component Value Date   WBC 9.1 05/27/2018   HGB 13.2 05/27/2018   HCT 39.3  05/27/2018   PLT 209 05/27/2018   GLUCOSE 78 06/01/2018   ALT 28 05/27/2018   AST 32 05/27/2018   NA 143 06/01/2018   K 2.8 (L) 06/01/2018   CL 103 06/01/2018   CREATININE 0.88 06/01/2018   BUN 14 06/01/2018   CO2 25 06/01/2018   INR 0.95 08/08/2015       BNP (last 3 results) No results for input(s): BNP in the last 8760 hours.  ProBNP (last 3 results) No results for input(s): PROBNP in the last 8760 hours.   Other Studies Reviewed Today:  LEFT HEART CATH AND CORONARY ANGIOGRAPHY 05/2018  Conclusion     Previously placed Prox LAD stent (unknown type) is widely patent.  Ost 1st Diag lesion is 85% stenosed.  Previously placed Prox Cx stent (unknown type) is widely patent.  Prox RCA to Mid RCA lesion is 45% stenosed.  Dist RCA lesion is 30% stenosed.  The left ventricular systolic function is normal.  LV end diastolic pressure is normal.  The left ventricular ejection fraction is 55-65% by visual estimate.   1. Single vessel obstructive CAD involving a small first diagonal. This vessel is acutely angulated from the LAD and is covered by the LAD stent. It is only 2 mm in diameter. 2. Continued patency of stents in the LAD and LCx. 3. Normal LV function 4. Normal LVEDP  Plan: continue medical  therapy.     Assessment/Plan:  1. CAD- prior PCI from 2017 - had recurrent chest pain/shortness of breath - now s/p recent cath with stable findings - she is improved clinically. No changes made. Needs CV risk factor modification. Cath site ok.   2. PVD- she needs follow up studies in 09/2018.  3. HTN- BP up some - she has been getting more salt with her recent cold.   4. HLD- she is on statin - last labs from her [facility] noted.   5. Obesity- she has previously been losing weight - encouraged to continue is given.   6. DM- per PCP  7. Chronic back pain - not discussed.   8. Hypokalemia - rechecking today. Potassium refilled today.   Current medicines are reviewed with the patient today.  The patient does not have concerns regarding medicines other than what has been noted above.  The following changes have been made:  See above.  Labs/ tests ordered today include:    Orders Placed This Encounter  Procedures  . Basic metabolic panel     Disposition:   FU with Dr. Katrinka Blazing in 6 months. I am happy to see back as needed.   Patient is agreeable to this plan and will call if any problems develop in the interim.   SignedNorma Fredrickson, NP  06/24/2018 9:33 AM  [facility] [address] [place], [place]  [address] Phone: [phone] Fax: [phone]

## 2018-06-24 ENCOUNTER — Ambulatory Visit (INDEPENDENT_AMBULATORY_CARE_PROVIDER_SITE_OTHER): Payer: Managed Care, Other (non HMO) | Admitting: Nurse Practitioner

## 2018-06-24 ENCOUNTER — Encounter: Payer: Self-pay | Admitting: Nurse Practitioner

## 2018-06-24 VITALS — BP 140/72 | HR 67 | Ht 64.0 in | Wt 204.8 lb

## 2018-06-24 DIAGNOSIS — E876 Hypokalemia: Secondary | ICD-10-CM | POA: Diagnosis not present

## 2018-06-24 DIAGNOSIS — I259 Chronic ischemic heart disease, unspecified: Secondary | ICD-10-CM | POA: Diagnosis not present

## 2018-06-24 LAB — BASIC METABOLIC PANEL
BUN/Creatinine Ratio: 18 (ref 12–28)
BUN: 19 mg/dL (ref 8–27)
CO2: 25 mmol/L (ref 20–29)
Calcium: 9.5 mg/dL (ref 8.7–10.3)
Chloride: 100 mmol/L (ref 96–106)
Creatinine, Ser: 1.04 mg/dL — ABNORMAL HIGH (ref 0.57–1.00)
GFR calc Af Amer: 64 mL/min/{1.73_m2} (ref >59)
GFR calc non Af Amer: 56 mL/min/{1.73_m2} — ABNORMAL LOW (ref >59)
Glucose: 132 mg/dL — ABNORMAL HIGH (ref 65–99)
Potassium: 5.2 mmol/L (ref 3.5–5.2)
Sodium: 138 mmol/L (ref 134–144)

## 2018-06-24 MED ORDER — POTASSIUM CHLORIDE CRYS ER 20 MEQ PO TBCR: 20.0000 meq | EXTENDED_RELEASE_TABLET | Freq: Every day | ORAL | 3 refills | Status: DC

## 2018-06-24 NOTE — Patient Instructions (Addendum)
We will be checking the following labs today - BMET  Medication Instructions:    Continue with your current medicines.   I have refilled your potassium today   If you need a refill on your cardiac medications before your next appointment, please call your pharmacy.     Testing/Procedures To Be Arranged:  N/A  Follow-Up:   See Dr.Smith in 6 months. I am happy to see you back as needed.     At [facility], you and your health needs are our priority.  As part of our continuing mission to provide you with exceptional heart care, we have created designated Provider Care Teams.  These Care Teams include your primary Cardiologist (physician) and Advanced Practice Providers (APPs -  Physician Assistants and Nurse Practitioners) who all work together to provide you with the care you need, when you need it.  Special Instructions:  . None  Call the [facility] office at [phone] if you have any questions, problems or concerns.

## 2018-07-16 ENCOUNTER — Encounter: Payer: Self-pay | Admitting: *Deleted

## 2018-07-23 ENCOUNTER — Telehealth: Payer: Self-pay | Admitting: Nurse Practitioner

## 2018-07-23 DIAGNOSIS — I10 Essential (primary) hypertension: Secondary | ICD-10-CM

## 2018-07-23 MED ORDER — POTASSIUM CHLORIDE ER 10 MEQ PO TBCR: 10.0000 meq | EXTENDED_RELEASE_TABLET | Freq: Every day | ORAL | 3 refills | Status: DC

## 2018-07-23 NOTE — Telephone Encounter (Signed)
Per message below, left very detailed message from her labs below:   Notes recorded by Rosalio Macadamia, NP on 06/25/2018 at 6:31 AM EST Ok to report. Labs are stable - potassium back to the higher end of normal - would cut back to daily - recheck BMET in one month and otherwise, would continue on current regimen.  Adv to pick up new dose of potassium at [facility] and then call office for repeat labs for one month after decreasing dose. Adv to call back if any questions regarding this plan.

## 2018-07-23 NOTE — Telephone Encounter (Signed)
° °  Please call patient and leave detailed msg on vcml with results.

## 2018-09-11 ENCOUNTER — Encounter (HOSPITAL_COMMUNITY): Payer: Managed Care, Other (non HMO)

## 2018-10-02 ENCOUNTER — Other Ambulatory Visit: Payer: Self-pay | Admitting: Interventional Cardiology

## 2018-10-05 ENCOUNTER — Other Ambulatory Visit: Payer: Self-pay | Admitting: Nurse Practitioner

## 2018-10-05 ENCOUNTER — Other Ambulatory Visit: Payer: Self-pay | Admitting: Interventional Cardiology

## 2018-10-29 ENCOUNTER — Encounter (HOSPITAL_COMMUNITY): Payer: Managed Care, Other (non HMO)

## 2019-01-05 ENCOUNTER — Other Ambulatory Visit: Payer: Self-pay

## 2019-01-05 ENCOUNTER — Other Ambulatory Visit: Payer: Self-pay | Admitting: Interventional Cardiology

## 2019-01-05 ENCOUNTER — Ambulatory Visit (HOSPITAL_COMMUNITY)
Admission: RE | Admit: 2019-01-05 | Discharge: 2019-01-05 | Disposition: A | Discharge status: Home / Self Care | Payer: Managed Care, Other (non HMO) | Source: Ambulatory Visit | Attending: Cardiovascular Disease | Admitting: Cardiovascular Disease

## 2019-01-05 DIAGNOSIS — I6523 Occlusion and stenosis of bilateral carotid arteries: Secondary | ICD-10-CM | POA: Insufficient documentation

## 2019-01-05 DIAGNOSIS — I739 Peripheral vascular disease, unspecified: Secondary | ICD-10-CM | POA: Insufficient documentation

## 2019-01-05 DIAGNOSIS — I779 Disorder of arteries and arterioles, unspecified: Secondary | ICD-10-CM

## 2019-01-06 ENCOUNTER — Encounter: Payer: Self-pay | Admitting: *Deleted

## 2019-01-06 NOTE — Progress Notes (Signed)
This encounter was created in error - please disregard.

## 2019-01-06 NOTE — Progress Notes (Signed)
This was opened in error This encounter was created in error - please disregard.

## 2019-03-14 NOTE — Progress Notes (Signed)
Cardiology Office Note:    Date:  03/15/2019   ID:  Darlene Rivera, DOB 05-11-1951, MRN [id number]  PCP:  Dorisann Frames, MD  Cardiologist:  Lesleigh Noe, MD   Referring MD: Dorisann Frames, MD   Chief Complaint  Patient presents with  . Coronary Artery Disease  . Chest Pain    History of Present Illness:    Darlene Rivera is a 68 y.o. female with a hx of CAD with remote DES in circumflex, recent LAD diagonal bifurcation intervention (07/2015) with provisional stenting of LAD and angioplasty of diagonal with excellent result, hypertension, hyperlipidemia, and diabetes.  Cardiac cath 2019 with stable anatomy.  She continues to have episodes of non-exertion related upper chest bilateral parasternal and bilateral neck discomfort that she now calls reflux.  The symptoms led to coronary angiography late last year.  No significant obstructive disease was identified.  Risk factor modification has been somewhat difficult.  A1c has been greater than 7.  LDL and blood pressure have been under good control.  She is falling short of physical activity/exercise.  Past Medical History:  Diagnosis Date  . Chest pain   . Diabetes mellitus without complication (HCC)   . GERD (gastroesophageal reflux disease)   . Hyperlipidemia   . Hypertension   . Rheumatoid arthritis(714.0)     Past Surgical History:  Procedure Laterality Date  . CARDIAC CATHETERIZATION N/A 08/11/2015   Procedure: Left Heart Cath and Coronary Angiography;  Surgeon: Lyn Records, MD;  Location: [facility] INVASIVE CV LAB;  Service: Cardiovascular;  Laterality: N/A;  . CARDIAC CATHETERIZATION N/A 08/11/2015   Procedure: Coronary Stent Intervention;  Surgeon: Lyn Records, MD;  Location: [facility] INVAS[facility] CV LAB;  Service: Cardiovascular;  Laterality: N/A;  . CARPAL TUNNEL RELEASE Right   . CORONARY ANGIOPLASTY WITH STENT PLACEMENT  2011   DES to the circumflex /notes 08/08/2015  . LEFT HEART CATH AND CORONARY ANGIOGRAPHY N/A  06/01/2018   Procedure: LEFT HEART CATH AND CORONARY ANGIOGRAPHY;  Surgeon: Swaz[place], Peter M, MD;  Location: [facility] INVASIVE CV LAB;  Service: Cardiovascular;  Laterality: N/A;  . NEUROPLASTY / TRANSPOSITION MEDIAN NERVE AT CARPAL TUNNEL Left   . ROUX-EN-Y GASTRIC BYPASS  2002  . TONSILLECTOMY      Current Medications: Current Meds  Medication Sig  . allopurinol (ZYLOPRIM) 300 MG tablet Take 150 mg by mouth daily.   . Calcium Carbonate-Vitamin D (CALCIUM-VITAMIN D) 500-200 MG-UNIT per tablet Take 1 tablet by mouth 2 (two) times daily.   . cetirizine (ZYRTEC) 10 MG tablet Take 10 mg by mouth at bedtime.  . cholecalciferol (VITAMIN D) 1000 UNITS tablet Take 2,000 Units by mouth daily.  . clopidogrel (PLAVIX) 75 MG tablet TAKE 1 TABLET BY MOUTH EVERY DAY  . Cyanocobalamin (VITAMIN B-12 PO) Take 1 tablet by mouth daily.  . Dapagliflozin-metFORMIN HCl ER (XIGDUO XR) 10-998 MG TB24 Take 1 tablet by mouth 2 (two) times daily.  . ferrous fumarate (HEMOCYTE - 106 MG FE) 325 (106 FE) MG TABS tablet Take 1 tablet by mouth daily before lunch.  . furosemide (LASIX) 40 MG tablet Take 40 mg by mouth daily.  Marland Kitchen ibuprofen (ADVIL,MOTRIN) 400 MG tablet Take 1 tablet (400 mg total) by mouth every 6 (six) hours as needed.  . insulin aspart protamine- aspart (NOVOLOG MIX 70/30) (70-30) 100 UNIT/ML injection Inject 40 Units into the skin daily with breakfast.  . Insulin Lispro (HUMALOG KWIKPEN Mildred) Inject 10 Units into the skin at bedtime.  Marland Kitchen  ipratropium (ATROVENT) 0.06 % nasal spray Place 2 sprays into both nostrils daily as needed for rhinitis.  Marland Kitchen isosorbide mononitrate (IMDUR) 60 MG 24 hr tablet Take 1 tablet (60 mg total) by mouth daily.  . metoprolol tartrate (LOPRESSOR) 50 MG tablet TAKE 1 TABLET BY MOUTH TWICE A DAY  . mometasone (NASONEX) 50 MCG/ACT nasal spray Place 2 sprays into the nose daily as needed (for allergies).  . Multiple Vitamin (MULTIVITAMIN) capsule Take 1 capsule by mouth daily.  . nitroGLYCERIN  (NITROSTAT) 0.4 MG SL tablet PLACE 1 TABLET (0.4 MG TOTAL) UNDER THE TONGUE EVERY 5 (FIVE) MINUTES AS NEEDED FOR CHEST PAIN.  Marland Kitchen Omega-3 Fatty Acids (FISH OIL) 1000 MG CAPS Take 2,000 mg by mouth daily.  . potassium chloride (K-DUR) 10 MEQ tablet Take 1 tablet (10 mEq total) by mouth daily.  Marland Kitchen PROAIR RESPICLICK 108 (90 Base) MCG/ACT AEPB Inhale 2 puffs into the lungs every 4 (four) hours as needed. (cough/wheezing)  . simvastatin (ZOCOR) 20 MG tablet TAKE 1 TABLET BY MOUTH EVERY DAY IN THE EVENING  . [DISCONTINUED] aspirin EC 81 MG tablet Take 1 tablet (81 mg total) by mouth daily.     Allergies:   Sulfa antibiotics   Social History   Socioeconomic History  . Marital status: Single    Spouse name: Not on file  . Number of children: Not on file  . Years of education: Not on file  . Highest education level: Not on file  Occupational History  . Not on file  Social Needs  . Financial resource strain: Not on file  . Food insecurity    Worry: Not on file    Inability: Not on file  . Transportation needs    Medical: Not on file    Non-medical: Not on file  Tobacco Use  . Smoking status: Never Smoker  . Smokeless tobacco: Never Used  Substance and Sexual Activity  . Alcohol use: No    Alcohol/week: 0.0 standard drinks  . Drug use: No  . Sexual activity: Not on file  Lifestyle  . Physical activity    Days per week: Not on file    Minutes per session: Not on file  . Stress: Not on file  Relationships  . Social Musician on phone: Not on file    Gets together: Not on file    Attends religious service: Not on file    Active member of club or organization: Not on file    Attends meetings of clubs or organizations: Not on file    Relationship status: Not on file  Other Topics Concern  . Not on file  Social History Narrative  . Not on file     Family History: The patient's family history includes Diabetes in her brother and mother; Hypertension in her mother and  sister.  ROS:   Please see the history of present illness.    Reflux symptoms.  She does not try nitroglycerin when the symptoms occur.  She snores.  She does not feel excessively fatigued.  All other systems reviewed and are negative.  EKGs/Labs/Other Studies Reviewed:    The following studies were reviewed today: CARDIAC CATH 2019: Diagnostic Dominance: Right    EKG:  EKG performed today demonstrates right bundle, left atrial abnormality, and heart rate of 87 bpm.  No change when compared with her tracing from 1 year ago.  Recent Labs: 05/27/2018: ALT 28; Hemoglobin 13.2; Platelets 209 06/24/2018: BUN 19; Creatinine, Ser 1.04; Potassium  5.2; Sodium 138  Recent Lipid Panel No results found for: CHOL, TRIG, HDL, CHOLHDL, VLDL, LDLCALC, LDLDIRECT  Physical Exam:    VS:  BP 122/60   Pulse 87   Ht 5\' 4"  (1.626 m)   Wt 218 lb 12.8 oz (99.2 kg)   SpO2 98%   BMI 37.56 kg/m     Wt Readings from Last 3 Encounters:  03/15/19 218 lb 12.8 oz (99.2 kg)  06/24/18 204 lb 12.8 oz (92.9 kg)  06/01/18 204 lb (92.5 kg)     GEN: Moderate obesity. No acute distress HEENT: Normal NECK: No JVD. LYMPHATICS: No lymphadenopathy CARDIAC:  RRR without murmur, gallop, or edema. VASCULAR:  Normal Pulses. No bruits. RESPIRATORY:  Clear to auscultation without rales, wheezing or rhonchi  ABDOMEN: Soft, non-tender, non-distended, No pulsatile mass, MUSCULOSKELETAL: No deformity  SKIN: Warm and dry NEUROLOGIC:  Alert and oriented x 3 PSYCHIATRIC:  Normal affect   ASSESSMENT:    1. Chest pressure   2. Other hyperlipidemia   3. Essential hypertension   4. Bilateral carotid artery stenosis   5. Educated About Covid-19 Virus Infection    PLAN:    In order of problems listed above:  1. Current chest discomfort not due to significant epicardial coronary obstruction.  Episodes occur predominantly at rest.  A microvascular dysfunction etiology may be present.  I have instructed her to try  sublingual nitroglycerin for the episodes to determine if there is improvement in the natural history/resolution.  If so, perhaps we should further uptitrate long-acting nitrate. 2. Target LDL should be less than 70. 3. Target blood pressure 130/80 mmHg. 4. No significant bruits are heard on exam today.  Continue primary prevention. 5. Masking, handwashing, and social distancing is encouraged.  Overall education and awareness concerning primary/secondary risk prevention was discussed in detail: LDL less than 70, hemoglobin A1c less than 7, blood pressure target less than 130/80 mmHg, >150 minutes of moderate aerobic activity per week, avoidance of smoking, weight control (via diet and exercise), and continued surveillance/management of/for obstructive sleep apnea.    Medication Adjustments/Labs and Tests Ordered: Current medicines are reviewed at length with the patient today.  Concerns regarding medicines are outlined above.  Orders Placed This Encounter  Procedures  . Basic metabolic panel  . Hepatic function panel  . EKG 12-Lead   No orders of the defined types were placed in this encounter.   Patient Instructions  Medication Instructions:  1) DISCONTINUE Aspirin 2) Use Nitro the next time you have the feeling of indigestion and let us know if it relieves the discomfort.  If you need a refill on your cardiac medications before your next appointment, please call your pharmacy.   Lab work: BMET and Liver today  If you have labs (blood work) drawn today and your tests are completely normal, you will receive your results only by: Marland Kitchen MyChart Message (if you have MyChart) OR . A paper copy in the mail If you have any lab test that is abnormal or we need to change your treatment, we will call you to review the results.  Testing/Procedures: None  Follow-Up: At [facility], you and your health needs are our priority.  As part of our continuing mission to provide you with  exceptional heart care, we have created designated Provider Care Teams.  These Care Teams include your primary Cardiologist (physician) and Advanced Practice Providers (APPs -  Physician Assistants and Nurse Practitioners) who all work together to provide you with the care you  need, when you need it. You will need a follow up appointment in 6 months.  Please call our office 2 months in advance to schedule this appointment.  You may see Lesleigh Noe, MD or one of the following Advanced Practice Providers on your designated Care Team:   Norma Fredrickson, NP Nada Boozer, NP . Georgie Chard, NP  Any Other Special Instructions Will Be Listed Below (If Applicable).       Signed, Lesleigh Noe, MD  03/15/2019 1:25 PM    [facility]

## 2019-03-15 ENCOUNTER — Encounter: Payer: Self-pay | Admitting: Interventional Cardiology

## 2019-03-15 ENCOUNTER — Ambulatory Visit (INDEPENDENT_AMBULATORY_CARE_PROVIDER_SITE_OTHER): Payer: Managed Care, Other (non HMO) | Admitting: Interventional Cardiology

## 2019-03-15 ENCOUNTER — Other Ambulatory Visit: Payer: Self-pay

## 2019-03-15 VITALS — BP 122/60 | HR 87 | Ht 64.0 in | Wt 218.8 lb

## 2019-03-15 DIAGNOSIS — Z7189 Other specified counseling: Secondary | ICD-10-CM | POA: Diagnosis not present

## 2019-03-15 DIAGNOSIS — R0789 Other chest pain: Secondary | ICD-10-CM

## 2019-03-15 DIAGNOSIS — I10 Essential (primary) hypertension: Secondary | ICD-10-CM

## 2019-03-15 DIAGNOSIS — I6523 Occlusion and stenosis of bilateral carotid arteries: Secondary | ICD-10-CM | POA: Diagnosis not present

## 2019-03-15 DIAGNOSIS — I259 Chronic ischemic heart disease, unspecified: Secondary | ICD-10-CM

## 2019-03-15 DIAGNOSIS — E7849 Other hyperlipidemia: Secondary | ICD-10-CM | POA: Diagnosis not present

## 2019-03-15 LAB — HEPATIC FUNCTION PANEL
ALT: 34 IU/L — ABNORMAL HIGH (ref 0–32)
AST: 23 IU/L (ref 0–40)
Albumin: 4.1 g/dL (ref 3.8–4.8)
Alkaline Phosphatase: 155 IU/L — ABNORMAL HIGH (ref 39–117)
Bilirubin Total: 0.4 mg/dL (ref 0.0–1.2)
Bilirubin, Direct: 0.14 mg/dL (ref 0.00–0.40)
Total Protein: 6.7 g/dL (ref 6.0–8.5)

## 2019-03-15 LAB — BASIC METABOLIC PANEL
BUN/Creatinine Ratio: 22 (ref 12–28)
BUN: 19 mg/dL (ref 8–27)
CO2: 23 mmol/L (ref 20–29)
Calcium: 9.4 mg/dL (ref 8.7–10.3)
Chloride: 104 mmol/L (ref 96–106)
Creatinine, Ser: 0.88 mg/dL (ref 0.57–1.00)
GFR calc Af Amer: 78 mL/min/{1.73_m2} (ref >59)
GFR calc non Af Amer: 68 mL/min/{1.73_m2} (ref >59)
Glucose: 190 mg/dL — ABNORMAL HIGH (ref 65–99)
Potassium: 4.4 mmol/L (ref 3.5–5.2)
Sodium: 143 mmol/L (ref 134–144)

## 2019-03-15 NOTE — Patient Instructions (Signed)
Medication Instructions:  1) DISCONTINUE Aspirin 2) Use Nitro the next time you have the feeling of indigestion and let us know if it relieves the discomfort.  If you need a refill on your cardiac medications before your next appointment, please call your pharmacy.   Lab work: BMET and Liver today  If you have labs (blood work) drawn today and your tests are completely normal, you will receive your results only by: Marland Kitchen MyChart Message (if you have MyChart) OR . A paper copy in the mail If you have any lab test that is abnormal or we need to change your treatment, we will call you to review the results.  Testing/Procedures: None  Follow-Up: At [facility], you and your health needs are our priority.  As part of our continuing mission to provide you with exceptional heart care, we have created designated Provider Care Teams.  These Care Teams include your primary Cardiologist (physician) and Advanced Practice Providers (APPs -  Physician Assistants and Nurse Practitioners) who all work together to provide you with the care you need, when you need it. You will need a follow up appointment in 6 months.  Please call our office 2 months in advance to schedule this appointment.  You may see Sinclair Grooms, MD or one of the following Advanced Practice Providers on your designated Care Team:   Truitt Merle, NP Cecilie Kicks, NP . Kathyrn Drown, NP  Any Other Special Instructions Will Be Listed Below (If Applicable).

## 2019-03-16 ENCOUNTER — Telehealth: Payer: Self-pay | Admitting: Interventional Cardiology

## 2019-03-16 NOTE — Telephone Encounter (Signed)
Patient called for her lab results.  °

## 2019-03-16 NOTE — Telephone Encounter (Signed)
Spoke with Dr. Tamala Julian and he said labs are stable.  Spoke with pt and made her aware.  Pt states at appt Dr. Tamala Julian had mentioned names of a few PCPs and pt couldn't remember who they were.  Advised I will send message to see who he had mentioned to her.

## 2019-03-16 NOTE — Telephone Encounter (Signed)
Dr. Seward Carol Dr. Grier Mitts Dr. Glendale Chard

## 2019-03-18 NOTE — Telephone Encounter (Signed)
Replied to pt's MyChart message.

## 2019-03-25 ENCOUNTER — Other Ambulatory Visit: Payer: Self-pay | Admitting: *Deleted

## 2019-03-25 DIAGNOSIS — R748 Abnormal levels of other serum enzymes: Secondary | ICD-10-CM

## 2019-04-15 ENCOUNTER — Other Ambulatory Visit: Payer: Managed Care, Other (non HMO) | Admitting: *Deleted

## 2019-04-15 ENCOUNTER — Other Ambulatory Visit: Payer: Self-pay

## 2019-04-15 DIAGNOSIS — R748 Abnormal levels of other serum enzymes: Secondary | ICD-10-CM

## 2019-04-15 LAB — HEPATIC FUNCTION PANEL
ALT: 22 IU/L (ref 0–32)
AST: 19 IU/L (ref 0–40)
Albumin: 4.3 g/dL (ref 3.8–4.8)
Alkaline Phosphatase: 146 IU/L — ABNORMAL HIGH (ref 39–117)
Bilirubin Total: 0.3 mg/dL (ref 0.0–1.2)
Bilirubin, Direct: 0.12 mg/dL (ref 0.00–0.40)
Total Protein: 7.1 g/dL (ref 6.0–8.5)

## 2019-05-18 ENCOUNTER — Other Ambulatory Visit: Payer: Self-pay | Admitting: Nurse Practitioner

## 2019-06-28 ENCOUNTER — Other Ambulatory Visit: Payer: Self-pay | Admitting: Interventional Cardiology

## 2019-07-11 ENCOUNTER — Other Ambulatory Visit: Payer: Self-pay | Admitting: Interventional Cardiology

## 2019-08-24 ENCOUNTER — Telehealth: Payer: Self-pay | Admitting: *Deleted

## 2019-08-24 NOTE — Telephone Encounter (Signed)
   [facility] Pre-operative Risk Assessment    Request for surgical clearance:  1. What type of surgery is being performed? COLONOSCOPY   2. When is this surgery scheduled? 09/14/19   3. What type of clearance is required (medical clearance vs. Pharmacy clearance to hold med vs. Both)? MEDICAL  4. Are there any medications that need to be held prior to surgery and how long? PLAVIX X 5 DAYS PRIOR TO PROCEDURE    5. Practice name and name of physician performing surgery? [facility]; DR. Paulita Fujita   6. What is your office phone number [phone]    7.   What is your office fax number [phone]  8.   Anesthesia type (None, local, MAC, general) ? NONE LISTED   Darlene Rivera 08/24/2019, 2:03 PM  _________________________________________________________________   (provider comments below)

## 2019-08-24 NOTE — Telephone Encounter (Signed)
Dr. Katrinka Blazing, pt has hx of CAD with remote DESincircumflex, recent LAD diagonal bifurcation intervention(07/2015)with provisional stenting of LAD and angioplasty of diagonal with excellent result. Cardiac cath 05/2018 with stable anatomy.  Can Plavix be held for 5 days for colonoscopy?  Please route response back to P CV DIV PREOP  Thanks, Coralee North

## 2019-08-27 NOTE — Telephone Encounter (Signed)
Okay to hold Plavix 

## 2019-08-27 NOTE — Telephone Encounter (Signed)
   Primary Cardiologist: Lesleigh Noe, MD  Chart reviewed as part of pre-operative protocol coverage. Patient was contacted 08/27/2019 in reference to pre-operative risk assessment for pending surgery as outlined below.  Darlene Rivera was last seen on 03/15/2019 by Dr. Katrinka Blazing.  Since that day, Darlene Rivera has done well with no new cardiac complaints.  Plavix can be held for 5 days prior to procedure. Resume as OK with GI after procedure.   Therefore, based on ACC/AHA guidelines, the patient would be at acceptable risk for the planned procedure without further cardiovascular testing.   I will route this recommendation to the requesting party via Epic fax function and remove from pre-op pool.  Please call with questions.  Berton Bon, NP 08/27/2019, 2:13 PM

## 2019-09-11 NOTE — Progress Notes (Signed)
Cardiology Office Note:    Date:  09/13/2019   ID:  Darlene Rivera, DOB 11/21/1950, MRN [id number]  PCP:  Renford Dills, MD  Cardiologist:  Lesleigh Noe, MD   Referring MD: Dorisann Frames, MD   Chief Complaint  Patient presents with  . Coronary Artery Disease    History of Present Illness:    Darlene Rivera is a 69 y.o. female with a hx of  CAD with remote DESincircumflex, recent LAD diagonal bifurcation intervention(07/2015)with provisional stenting of LAD and angioplasty of diagonal with excellent result, hypertension, hyperlipidemia, and diabetes.  Occasional tightness in her chest.  These episodes are random occurring mostly at work.  She would just noticed that there is mild tightness in her chest that she would graded as a 1/10 in intensity.  This sensation is not precipitated by activity.  She has been relatively sedentary during the COVID-19 pandemic.  She is not quite living until the 150 minutes of moderate activity per week.  She has no orthopnea, PND, medication side effects, edema, palpitations, or syncope.  Past Medical History:  Diagnosis Date  . Chest pain   . Diabetes mellitus without complication (HCC)   . GERD (gastroesophageal reflux disease)   . Hyperlipidemia   . Hypertension   . Rheumatoid arthritis(714.0)     Past Surgical History:  Procedure Laterality Date  . CARDIAC CATHETERIZATION N/A 08/11/2015   Procedure: Left Heart Cath and Coronary Angiography;  Surgeon: Lyn Records, MD;  Location: [facility] INVASIVE CV LAB;  Service: Cardiovascular;  Laterality: N/A;  . CARDIAC CATHETERIZATION N/A 08/11/2015   Procedure: Coronary Stent Intervention;  Surgeon: Lyn Records, MD;  Location: [facility] INVASIVE CV LAB;  Service: Cardiovascular;  Laterality: N/A;  . CARPAL TUNNEL RELEASE Right   . CORONARY ANGIOPLASTY WITH STENT PLACEMENT  2011   DES to the circumflex /notes 08/08/2015  . LEFT HEART CATH AND CORONARY ANGIOGRAPHY N/A 06/01/2018   Procedure: LEFT  HEART CATH AND CORONARY ANGIOGRAPHY;  Surgeon: [place], Peter M, MD;  Location: [facility] INVASIVE CV LAB;  Service: Cardiovascular;  Laterality: N/A;  . NEUROPLASTY / TRANSPOSITION MEDIAN NERVE AT CARPAL TUNNEL Left   . ROUX-EN-Y GASTRIC BYPASS  2002  . TONSILLECTOMY      Current Medications: Current Meds  Medication Sig  . allopurinol (ZYLOPRIM) 300 MG tablet Take 150 mg by mouth daily.   . Calcium Carbonate-Vitamin D (CALCIUM-VITAMIN D) 500-200 MG-UNIT per tablet Take 1 tablet by mouth 2 (two) times daily.   . cetirizine (ZYRTEC) 10 MG tablet Take 10 mg by mouth at bedtime.  . cholecalciferol (VITAMIN D) 1000 UNITS tablet Take 2,000 Units by mouth daily.  . clopidogrel (PLAVIX) 75 MG tablet TAKE 1 TABLET BY MOUTH EVERY DAY  . Cyanocobalamin (VITAMIN B-12 PO) Take 1 tablet by mouth daily.  . ferrous fumarate (HEMOCYTE - 106 MG FE) 325 (106 FE) MG TABS tablet Take 1 tablet by mouth daily before lunch.  . furosemide (LASIX) 40 MG tablet Take 40 mg by mouth daily.  Marland Kitchen ibuprofen (ADVIL,MOTRIN) 400 MG tablet Take 1 tablet (400 mg total) by mouth every 6 (six) hours as needed.  . insulin aspart protamine- aspart (NOVOLOG MIX 70/30) (70-30) 100 UNIT/ML injection Inject 40 Units into the skin daily with breakfast.  . Insulin Lispro (HUMALOG KWIKPEN [place]) Inject 10 Units into the skin at bedtime.  Marland Kitchen ipratropium (ATROVENT) 0.06 % nasal spray Place 2 sprays into both nostrils daily as needed for rhinitis.  Marland Kitchen isosorbide mononitrate (IMDUR)  60 MG 24 hr tablet TAKE 1 TABLET BY MOUTH EVERY DAY  . metoprolol tartrate (LOPRESSOR) 50 MG tablet TAKE 1 TABLET BY MOUTH TWICE A DAY  . mometasone (NASONEX) 50 MCG/ACT nasal spray Place 2 sprays into the nose daily as needed (for allergies).  . Multiple Vitamin (MULTIVITAMIN) capsule Take 1 capsule by mouth daily.  . Omega-3 Fatty Acids (FISH OIL) 1000 MG CAPS Take 2,000 mg by mouth daily.  . potassium chloride (K-DUR) 10 MEQ tablet Take 1 tablet (10 mEq total) by mouth  daily.  Marland Kitchen PROAIR RESPICLICK 108 (90 Base) MCG/ACT AEPB Inhale 2 puffs into the lungs every 4 (four) hours as needed. (cough/wheezing)  . simvastatin (ZOCOR) 20 MG tablet TAKE 1 TABLET BY MOUTH EVERY DAY IN THE EVENING  . TRULICITY 1.5 MG/0.5ML SOPN every 7 (seven) days.   Marland Kitchen XIGDUO XR 5-500 MG TB24 Take 1 tablet by mouth 2 (two) times daily.     Allergies:   Sulfa antibiotics   Social History   Socioeconomic History  . Marital status: Single    Spouse name: Not on file  . Number of children: Not on file  . Years of education: Not on file  . Highest education level: Not on file  Occupational History  . Not on file  Tobacco Use  . Smoking status: Never Smoker  . Smokeless tobacco: Never Used  Substance and Sexual Activity  . Alcohol use: No    Alcohol/week: 0.0 standard drinks  . Drug use: No  . Sexual activity: Not on file  Other Topics Concern  . Not on file  Social History Narrative  . Not on file   Social Determinants of Health   Financial Resource Strain:   . Difficulty of Paying Living Expenses:   Food Insecurity:   . Worried About Programme researcher, broadcasting/film/video in the Last Year:   . Barista in the Last Year:   Transportation Needs:   . Freight forwarder (Medical):   Marland Kitchen Lack of Transportation (Non-Medical):   Physical Activity:   . Days of Exercise per Week:   . Minutes of Exercise per Session:   Stress:   . Feeling of Stress :   Social Connections:   . Frequency of Communication with Friends and Family:   . Frequency of Social Gatherings with Friends and Family:   . Attends Religious Services:   . Active Member of Clubs or Organizations:   . Attends Banker Meetings:   Marland Kitchen Marital Status:      Family History: The patient's family history includes Diabetes in her brother and mother; Hypertension in her mother and sister.  ROS:   Please see the history of present illness.    She does not use nitroglycerin for the discomfort.  She admits to  being relatively sedentary.  She understands that her blood sugars have been poorly controlled.  Currently taking Trulicity all other systems reviewed and are negative.  EKGs/Labs/Other Studies Reviewed:    The following studies were reviewed today:  Carotid Doppler study 2020: Summary:  Right Carotid: Velocities in the right ICA are consistent with a 1-39%  stenosis.         The RICA velocities remain within normal range and have  decreased         when compared to the prior exam.   Left Carotid: Velocities in the left ICA are consistent with a 1-39%  stenosis.        Non-hemodynamically significant plaque  noted in the CCA. The  LICA        velocities have decreased and are now within normal range  when        compared to the prior exam.   Vertebrals: Bilateral vertebral arteries demonstrate antegrade flow.  Subclavians: Right subclavian artery was stenotic. Right subclavian artery  flow        was disturbed. Normal flow hemodynamics were seen in the left        subclavian artery.   EKG:  EKG performed in September 2020 demonstrates sinus rhythm, right bundle branch block, and PACs.  Recent Labs: 03/15/2019: BUN 19; Creatinine, Ser 0.88; Potassium 4.4; Sodium 143 04/15/2019: ALT 22  Recent Lipid Panel No results found for: CHOL, TRIG, HDL, CHOLHDL, VLDL, LDLCALC, LDLDIRECT  Physical Exam:    VS:  BP 128/82   Pulse 76   Ht 5\' 4"  (1.626 m)   Wt 207 lb 12.8 oz (94.3 kg)   SpO2 98%   BMI 35.67 kg/m     Wt Readings from Last 3 Encounters:  09/13/19 207 lb 12.8 oz (94.3 kg)  03/15/19 218 lb 12.8 oz (99.2 kg)  06/24/18 204 lb 12.8 oz (92.9 kg)     GEN: Moderate obesity. No acute distress HEENT: Normal NECK: No JVD. LYMPHATICS: No lymphadenopathy CARDIAC:  RRR without murmur, gallop, or edema. VASCULAR:  Normal Pulses. No bruits. RESPIRATORY:  Clear to auscultation without rales, wheezing or rhonchi  ABDOMEN:  Soft, non-tender, non-distended, No pulsatile mass, MUSCULOSKELETAL: No deformity  SKIN: Warm and dry NEUROLOGIC:  Alert and oriented x 3 PSYCHIATRIC:  Normal affect   ASSESSMENT:    1. Atherosclerosis of native coronary artery of native heart with stable angina pectoris (HCC)   2. Other hyperlipidemia   3. Essential hypertension   4. Bilateral carotid artery stenosis   5. Educated about COVID-19 virus infection    PLAN:    In order of problems listed above:  1. Secondary prevention discussed in detail. 2. Target LDL less than 70.  Exercise, diet, and weight loss will help along with continuation of moderate intensity statin therapy.  We should probably switch her to atorvastatin or rosuvastatin 20 mg/day. 3. Blood pressure is good.  Low-salt diet.  Exercise and weight loss are encouraged. 4. Primary prevention.  Discussed in detail. 5. COVID-19 vaccine is been received.  Social distancing is being practiced.   Overall education and awareness concerning primary/secondary risk prevention was discussed in detail: LDL less than 70, hemoglobin A1c less than 7, blood pressure target less than 130/80 mmHg, >150 minutes of moderate aerobic activity per week, avoidance of smoking, weight control (via diet and exercise), and continued surveillance/management of/for obstructive sleep apnea.   Medication Adjustments/Labs and Tests Ordered: Current medicines are reviewed at length with the patient today.  Concerns regarding medicines are outlined above.  No orders of the defined types were placed in this encounter.  No orders of the defined types were placed in this encounter.   Patient Instructions  Medication Instructions:  Your physician recommends that you continue on your current medications as directed. Please refer to the Current Medication list given to you today.  *If you need a refill on your cardiac medications before your next appointment, please call your pharmacy*   Lab  Work: None If you have labs (blood work) drawn today and your tests are completely normal, you will receive your results only by: Marland Kitchen MyChart Message (if you have MyChart) OR . A paper copy in  the mail If you have any lab test that is abnormal or we need to change your treatment, we will call you to review the results.   Testing/Procedures: None   Follow-Up: At [facility], you and your health needs are our priority.  As part of our continuing mission to provide you with exceptional heart care, we have created designated Provider Care Teams.  These Care Teams include your primary Cardiologist (physician) and Advanced Practice Providers (APPs -  Physician Assistants and Nurse Practitioners) who all work together to provide you with the care you need, when you need it.  We recommend signing up for the patient portal called "MyChart".  Sign up information is provided on this After Visit Summary.  MyChart is used to connect with patients for Virtual Visits (Telemedicine).  Patients are able to view lab/test results, encounter notes, upcoming appointments, etc.  Non-urgent messages can be sent to your provider as well.   To learn more about what you can do with MyChart, go to [email or web address].    Your next appointment:   12 month(s)  The format for your next appointment:   In Person  Provider:   You may see Lesleigh Noe, MD or one of the following Advanced Practice Providers on your designated Care Team:    Norma Fredrickson, NP  Nada Boozer, NP  Georgie Chard, NP    Other Instructions      Signed, Lesleigh Noe, MD  09/13/2019 9:19 AM     [facility]

## 2019-09-13 ENCOUNTER — Encounter: Payer: Self-pay | Admitting: Interventional Cardiology

## 2019-09-13 ENCOUNTER — Ambulatory Visit (INDEPENDENT_AMBULATORY_CARE_PROVIDER_SITE_OTHER): Payer: Managed Care, Other (non HMO) | Admitting: Interventional Cardiology

## 2019-09-13 ENCOUNTER — Other Ambulatory Visit: Payer: Self-pay

## 2019-09-13 VITALS — BP 128/82 | HR 76 | Ht 64.0 in | Wt 207.8 lb

## 2019-09-13 DIAGNOSIS — I10 Essential (primary) hypertension: Secondary | ICD-10-CM

## 2019-09-13 DIAGNOSIS — I6523 Occlusion and stenosis of bilateral carotid arteries: Secondary | ICD-10-CM

## 2019-09-13 DIAGNOSIS — E7849 Other hyperlipidemia: Secondary | ICD-10-CM | POA: Diagnosis not present

## 2019-09-13 DIAGNOSIS — Z7189 Other specified counseling: Secondary | ICD-10-CM | POA: Diagnosis not present

## 2019-09-13 DIAGNOSIS — I25118 Atherosclerotic heart disease of native coronary artery with other forms of angina pectoris: Secondary | ICD-10-CM | POA: Diagnosis not present

## 2019-09-13 NOTE — Patient Instructions (Signed)

## 2019-10-28 ENCOUNTER — Other Ambulatory Visit: Payer: Self-pay | Admitting: Nurse Practitioner

## 2020-02-24 ENCOUNTER — Ambulatory Visit (INDEPENDENT_AMBULATORY_CARE_PROVIDER_SITE_OTHER): Payer: Managed Care, Other (non HMO) | Admitting: Ophthalmology

## 2020-02-24 ENCOUNTER — Encounter (INDEPENDENT_AMBULATORY_CARE_PROVIDER_SITE_OTHER): Payer: Self-pay | Admitting: Ophthalmology

## 2020-02-24 ENCOUNTER — Other Ambulatory Visit: Payer: Self-pay

## 2020-02-24 DIAGNOSIS — H4312 Vitreous hemorrhage, left eye: Secondary | ICD-10-CM

## 2020-02-24 DIAGNOSIS — I6523 Occlusion and stenosis of bilateral carotid arteries: Secondary | ICD-10-CM

## 2020-02-24 DIAGNOSIS — H33312 Horseshoe tear of retina without detachment, left eye: Secondary | ICD-10-CM | POA: Diagnosis not present

## 2020-02-24 MED ORDER — PREDNISOLONE ACETATE 1 % OP SUSP: 1.0000 [drp] | Freq: Four times a day (QID) | OPHTHALMIC | 0 refills | Status: DC

## 2020-02-24 MED ORDER — OFLOXACIN 0.3 % OP SOLN: 1.0000 [drp] | Freq: Four times a day (QID) | OPHTHALMIC | 0 refills | Status: AC

## 2020-02-24 NOTE — Progress Notes (Signed)
02/24/2020     CHIEF COMPLAINT Patient presents for Blurred Vision   HISTORY OF PRESENT ILLNESS: Darlene Rivera is a 69 y.o. female who presents to the clinic today for:   HPI    Blurred Vision    In left eye.  Onset was sudden.  Vision is blurred and hazy.  Severity is moderate.  This started 1 day ago.  Occurring constantly.  It is worse throughout the day.  Since onset it is stable.  Treatments tried include no treatments.  Response to treatment was no improvement.  I, the attending physician,  performed the HPI with the patient and updated documentation appropriately.          Comments    Pt referred by Dr. Sharen Counter for Retinal Tear OS  Pt states she noticed a "blood vessel" in vision. Pt states she went back to sleep and woke up and there was blood in her vision. Pt hs hx of this. Pt states vision is hazy and blurry. Pt c/o some OS discomfort. BGL: did not check       Last edited by Tilda Franco on 02/24/2020 10:56 AM. (History)      Referring physician: Seward Carol, MD [address] [place],  [place] [address]  HISTORICAL INFORMATION:   Selected notes from the MEDICAL RECORD NUMBER       CURRENT MEDICATIONS: Current Outpatient Medications (Ophthalmic Drugs)  Medication Sig  . ofloxacin (OCUFLOX) 0.3 % ophthalmic solution Place 1 drop into the left eye 4 (four) times daily for 10 days.  . prednisoLONE acetate (PRED FORTE) 1 % ophthalmic suspension Place 1 drop into the left eye 4 (four) times daily.   No current facility-administered medications for this visit. (Ophthalmic Drugs)   Current Outpatient Medications (Other)  Medication Sig  . allopurinol (ZYLOPRIM) 300 MG tablet Take 150 mg by mouth daily.   . Calcium Carbonate-Vitamin D (CALCIUM-VITAMIN D) 500-200 MG-UNIT per tablet Take 1 tablet by mouth 2 (two) times daily.   . cetirizine (ZYRTEC) 10 MG tablet Take 10 mg by mouth at bedtime.  . cholecalciferol (VITAMIN D) 1000 UNITS tablet  Take 2,000 Units by mouth daily.  . clopidogrel (PLAVIX) 75 MG tablet TAKE 1 TABLET BY MOUTH EVERY DAY  . Cyanocobalamin (VITAMIN B-12 PO) Take 1 tablet by mouth daily.  . ferrous fumarate (HEMOCYTE - 106 MG FE) 325 (106 FE) MG TABS tablet Take 1 tablet by mouth daily before lunch.  . furosemide (LASIX) 40 MG tablet Take 40 mg by mouth daily.  Marland Kitchen ibuprofen (ADVIL,MOTRIN) 400 MG tablet Take 1 tablet (400 mg total) by mouth every 6 (six) hours as needed.  . insulin aspart protamine- aspart (NOVOLOG MIX 70/30) (70-30) 100 UNIT/ML injection Inject 40 Units into the skin daily with breakfast.  . Insulin Lispro (HUMALOG KWIKPEN [place]) Inject 10 Units into the skin at bedtime.  Marland Kitchen ipratropium (ATROVENT) 0.06 % nasal spray Place 2 sprays into both nostrils daily as needed for rhinitis.  Marland Kitchen isosorbide mononitrate (IMDUR) 60 MG 24 hr tablet TAKE 1 TABLET BY MOUTH EVERY DAY  . metoprolol tartrate (LOPRESSOR) 50 MG tablet TAKE 1 TABLET BY MOUTH TWICE A DAY  . mometasone (NASONEX) 50 MCG/ACT nasal spray Place 2 sprays into the nose daily as needed (for allergies).  . Multiple Vitamin (MULTIVITAMIN) capsule Take 1 capsule by mouth daily.  . nitroGLYCERIN (NITROSTAT) 0.4 MG SL tablet PLACE 1 TABLET (0.4 MG TOTAL) UNDER THE TONGUE EVERY 5 (FIVE) MINUTES  AS NEEDED FOR CHEST PAIN.  Marland Kitchen Omega-3 Fatty Acids (FISH OIL) 1000 MG CAPS Take 2,000 mg by mouth daily.  . potassium chloride (KLOR-CON) 10 MEQ tablet TAKE 1 TABLET BY MOUTH EVERY DAY  . PROAIR RESPICLICK 630 (90 Base) MCG/ACT AEPB Inhale 2 puffs into the lungs every 4 (four) hours as needed. (cough/wheezing)  . simvastatin (ZOCOR) 20 MG tablet TAKE 1 TABLET BY MOUTH EVERY DAY IN THE EVENING  . TRULICITY 1.5 ZS/0.1UX SOPN every 7 (seven) days.   Marland Kitchen XIGDUO XR 5-500 MG TB24 Take 1 tablet by mouth 2 (two) times daily.   No current facility-administered medications for this visit. (Other)      REVIEW OF SYSTEMS:    ALLERGIES Allergies  Allergen Reactions  .  Sulfa Antibiotics Nausea Only    PAST MEDICAL HISTORY Past Medical History:  Diagnosis Date  . Chest pain   . Diabetes mellitus without complication ([place])   . GERD (gastroesophageal reflux disease)   . Hyperlipidemia   . Hypertension   . Rheumatoid arthritis(714.0)    Past Surgical History:  Procedure Laterality Date  . CARDIAC CATHETERIZATION N/A 08/11/2015   Procedure: Left Heart Cath and Coronary Angiography;  Surgeon: Belva Crome, MD;  Location: [place] CV LAB;  Service: Cardiovascular;  Laterality: N/A;  . CARDIAC CATHETERIZATION N/A 08/11/2015   Procedure: Coronary Stent Intervention;  Surgeon: Belva Crome, MD;  Location: [place] CV LAB;  Service: Cardiovascular;  Laterality: N/A;  . CARPAL TUNNEL RELEASE Right   . CATARACT EXTRACTION Bilateral 2019  . CORONARY ANGIOPLASTY WITH STENT PLACEMENT  2011   DES to the circumflex /notes 08/08/2015  . LEFT HEART CATH AND CORONARY ANGIOGRAPHY N/A 06/01/2018   Procedure: LEFT HEART CATH AND CORONARY ANGIOGRAPHY;  Surgeon: [place], Peter M, MD;  Location: [place] CV LAB;  Service: Cardiovascular;  Laterality: N/A;  . NEUROPLASTY / TRANSPOSITION MEDIAN NERVE AT CARPAL TUNNEL Left   . ROUX-EN-Y GASTRIC BYPASS  2002  . TONSILLECTOMY      FAMILY HISTORY Family History  Problem Relation Age of Onset  . Diabetes Mother   . Hypertension Mother   . Diabetes Brother   . Hypertension Sister     SOCIAL HISTORY Social History   Tobacco Use  . Smoking status: Never Smoker  . Smokeless tobacco: Never Used  Substance Use Topics  . Alcohol use: No    Alcohol/week: 0.0 standard drinks  . Drug use: No         OPHTHALMIC EXAM:  Base Eye Exam    Visual Acuity (Snellen - Linear)      Right Left   Dist [place] 20/50 -2 CF @ 2'   Dist ph  20/30 + NI       Tonometry (Tonopen, 11:01 AM)      Right Left   Pressure 16 14       Pupils      Pupils Dark Light Shape React APD   Right PERRL 2 2 Round Minimal None   Left  PERRL 9 9 Round Dilated        Visual Fields (Counting fingers)      Left Right     Full   Restrictions Total inferior nasal deficiency        Neuro/Psych    Oriented x3: Yes   Mood/Affect: Normal        Slit Lamp and Fundus Exam    External Exam      Right Left   External Normal  Normal       Slit Lamp Exam      Right Left   Lids/Lashes Normal Normal   Conjunctiva/Sclera White and quiet White and quiet   Cornea Clear Clear   Anterior Chamber Deep and quiet Deep and quiet   Iris Round and reactive Round and reactive   Lens Centered posterior chamber intraocular lens Centered posterior chamber intraocular lens   Anterior Vitreous Normal Normal       Fundus Exam      Right Left   Posterior Vitreous  Subhyaloid hemorrhage, Vitreous hemorrhage   Disc  Hazy details of the nerve   C/D Ratio  No details possibly 0.5   Macula  No details no details   Vessels  No retinal vascular seen through the medial opacity   Periphery  Red reflex but no clear retinal details          IMAGING AND PROCEDURES  Imaging and Procedures for 02/24/20  Color Fundus Photography Optos - OU - Both Eyes       Right Eye Progression has no prior data. Disc findings include normal observations. Macula : normal observations. Vessels : normal observations. Periphery : normal observations.   Left Eye Progression has no prior data.   Notes Dense vitreous hemorrhage through which the optic nerve is somewhat faintly seen left eye yet no peripheral effusions are visible and no retinal vascular details can be is ascertained.  Right eye with normal optic nerve and apparent normal retinal vasculature on the macroscopic level.       B-Scan Ultrasound - OS - Left Eye       Quality was good. Findings included vitreous hemorrhage, peripheral membrane, subhyaloid opacities. Lesion size was unable to assess.   Notes Dense subhyaloid and of vitreous hemorrhage left eye with evidence of retinal tear  in the superotemporal quadrants on B-scan ultrasonography.  No retinal detachment is noted.  There is substantial thickening of the posterior hyaloid overlying the macula which suggest there may be neovascularization of the nerve although these details are not discernible                ASSESSMENT/PLAN:  Vitreous hemorrhage of left eye (HCC) Vitreous hemorrhage of the left eye most likely related to a retinal tear.  There is a possibility there may be underlying significant diabetic retinopathy given the long duration of type 2 diabetes mellitus in the case of this patient and her medical history.  The density of the vitreous hemorrhage is likely going to require consideration operating a vitrectomy left eye under local MAC anesthesia to clear the hemorrhage, and to treat the underlying cause whether retinal tear or neovascularization from retinal disease of diabetes      ICD-10-CM   1. Vitreous hemorrhage of left eye (HCC)  H43.12 Color Fundus Photography Optos - OU - Both Eyes    B-Scan Ultrasound - OS - Left Eye  2. Retinal tear, left  H33.312 B-Scan Ultrasound - OS - Left Eye    1.  B-scan ultrasonography highly suggest the presence of an underlying retinal tear in the superotemporal quadrant of the left eye.  Dense preretinal, subhyaloid and vitreous hemorrhage exist which obscures the the actual cause of this dense vitreous hemorrhage.  For this reason and to improve visual acuity surgical intervention will be undertaking in the near future via vitrectomy, endolaser under local MAC anesthesia.  2.  I explained to the patient at some length the structure of the eye, and the  indication for procedure.  3.  Risk and benefits were reviewed with the patient.  Ophthalmic Meds Ordered this visit:  Meds ordered this encounter  Medications  . ofloxacin (OCUFLOX) 0.3 % ophthalmic solution    Sig: Place 1 drop into the left eye 4 (four) times daily for 10 days.    Dispense:  5 mL     Refill:  0  . prednisoLONE acetate (PRED FORTE) 1 % ophthalmic suspension    Sig: Place 1 drop into the left eye 4 (four) times daily.    Dispense:  10 mL    Refill:  0       Return for OS, schedule vitrectomy, endolaser left eye, local MAC, [facility].  Patient Instructions  Patient instructed to commence with topical eyedrop therapy to the left eye only 1 drop to left eye 4 times daily starting 2 days prior to surgery.  Surgical intervention is currently scheduled on March 02, 2020 at [facility] [address].      Explained the diagnoses, plan, and follow up with the patient and they expressed understanding.  Patient expressed understanding of the importance of proper follow up care.   Clent Demark Denver Bentson M.D. Diseases & Surgery of the Retina and Vitreous Retina & Diabetic [place] 02/24/20     Abbreviations: M myopia (nearsighted); A astigmatism; H hyperopia (farsighted); P presbyopia; Mrx spectacle prescription;  CTL contact lenses; OD right eye; OS left eye; OU both eyes  XT exotropia; ET esotropia; PEK punctate epithelial keratitis; PEE punctate epithelial erosions; DES dry eye syndrome; MGD meibomian gland dysfunction; ATs artificial tears; PFAT's preservative free artificial tears; [place] nuclear sclerotic cataract; PSC posterior subcapsular cataract; ERM epi-retinal membrane; PVD posterior vitreous detachment; RD retinal detachment; DM diabetes mellitus; DR diabetic retinopathy; NPDR non-proliferative diabetic retinopathy; PDR proliferative diabetic retinopathy; CSME clinically significant macular edema; DME diabetic macular edema; dbh dot blot hemorrhages; CWS cotton wool spot; POAG primary open angle glaucoma; C/D cup-to-disc ratio; HVF humphrey visual field; GVF goldmann visual field; OCT optical coherence tomography; IOP intraocular pressure; BRVO Branch retinal vein occlusion; CRVO central retinal vein occlusion; CRAO central retinal artery  occlusion; BRAO branch retinal artery occlusion; RT retinal tear; SB scleral buckle; PPV pars plana vitrectomy; VH Vitreous hemorrhage; PRP panretinal laser photocoagulation; IVK intravitreal kenalog; VMT vitreomacular traction; MH Macular hole;  NVD neovascularization of the disc; NVE neovascularization elsewhere; AREDS age related eye disease study; ARMD age related macular degeneration; POAG primary open angle glaucoma; EBMD epithelial/anterior basement membrane dystrophy; ACIOL anterior chamber intraocular lens; IOL intraocular lens; PCIOL posterior chamber intraocular lens; Phaco/IOL phacoemulsification with intraocular lens placement; [place] photorefractive keratectomy; LASIK laser assisted in situ keratomileusis; HTN hypertension; DM diabetes mellitus; COPD chronic obstructive pulmonary disease

## 2020-02-24 NOTE — Patient Instructions (Signed)
Patient instructed to commence with topical eyedrop therapy to the left eye only 1 drop to left eye 4 times daily starting 2 days prior to surgery.  Surgical intervention is currently scheduled on March 02, 2020 at [facility] on [address]

## 2020-02-24 NOTE — Assessment & Plan Note (Signed)
Vitreous hemorrhage of the left eye most likely related to a retinal tear.  There is a possibility there may be underlying significant diabetic retinopathy given the long duration of type 2 diabetes mellitus in the case of this patient and her medical history.  The density of the vitreous hemorrhage is likely going to require consideration operating a vitrectomy left eye under local MAC anesthesia to clear the hemorrhage, and to treat the underlying cause whether retinal tear or neovascularization from retinal disease of diabetes

## 2020-03-02 ENCOUNTER — Encounter (INDEPENDENT_AMBULATORY_CARE_PROVIDER_SITE_OTHER): Payer: Managed Care, Other (non HMO) | Admitting: Ophthalmology

## 2020-03-02 DIAGNOSIS — H33312 Horseshoe tear of retina without detachment, left eye: Secondary | ICD-10-CM

## 2020-03-02 DIAGNOSIS — H4312 Vitreous hemorrhage, left eye: Secondary | ICD-10-CM | POA: Diagnosis not present

## 2020-03-02 DIAGNOSIS — H348121 Central retinal vein occlusion, left eye, with retinal neovascularization: Secondary | ICD-10-CM | POA: Diagnosis not present

## 2020-03-03 ENCOUNTER — Ambulatory Visit (INDEPENDENT_AMBULATORY_CARE_PROVIDER_SITE_OTHER): Payer: Managed Care, Other (non HMO) | Admitting: Ophthalmology

## 2020-03-03 DIAGNOSIS — H4312 Vitreous hemorrhage, left eye: Secondary | ICD-10-CM

## 2020-03-03 DIAGNOSIS — Z09 Encounter for follow-up examination after completed treatment for conditions other than malignant neoplasm: Secondary | ICD-10-CM

## 2020-03-03 DIAGNOSIS — H348321 Tributary (branch) retinal vein occlusion, left eye, with retinal neovascularization: Secondary | ICD-10-CM

## 2020-03-03 DIAGNOSIS — H348322 Tributary (branch) retinal vein occlusion, left eye, stable: Secondary | ICD-10-CM | POA: Insufficient documentation

## 2020-03-03 NOTE — Patient Instructions (Signed)
Patient instructed to contact the office for new onset visual acuity decline or distortion

## 2020-03-03 NOTE — Progress Notes (Signed)
03/03/2020     CHIEF COMPLAINT Patient presents for No chief complaint on file.   HISTORY OF PRESENT ILLNESS: Darlene Rivera is a 69 y.o. female who presents to the clinic today for:     Referring physician: No referring provider defined for this encounter.  HISTORICAL INFORMATION:   Selected notes from the MEDICAL RECORD NUMBER       CURRENT MEDICATIONS: Current Outpatient Medications (Ophthalmic Drugs)  Medication Sig  . ofloxacin (OCUFLOX) 0.3 % ophthalmic solution Place 1 drop into the left eye 4 (four) times daily for 10 days.  . prednisoLONE acetate (PRED FORTE) 1 % ophthalmic suspension Place 1 drop into the left eye 4 (four) times daily.   No current facility-administered medications for this visit. (Ophthalmic Drugs)   Current Outpatient Medications (Other)  Medication Sig  . allopurinol (ZYLOPRIM) 300 MG tablet Take 150 mg by mouth daily.   . Calcium Carbonate-Vitamin D (CALCIUM-VITAMIN D) 500-200 MG-UNIT per tablet Take 1 tablet by mouth 2 (two) times daily.   . cetirizine (ZYRTEC) 10 MG tablet Take 10 mg by mouth at bedtime.  . cholecalciferol (VITAMIN D) 1000 UNITS tablet Take 2,000 Units by mouth daily.  . clopidogrel (PLAVIX) 75 MG tablet TAKE 1 TABLET BY MOUTH EVERY DAY  . Cyanocobalamin (VITAMIN B-12 PO) Take 1 tablet by mouth daily.  . ferrous fumarate (HEMOCYTE - 106 MG FE) 325 (106 FE) MG TABS tablet Take 1 tablet by mouth daily before lunch.  . furosemide (LASIX) 40 MG tablet Take 40 mg by mouth daily.  Marland Kitchen ibuprofen (ADVIL,MOTRIN) 400 MG tablet Take 1 tablet (400 mg total) by mouth every 6 (six) hours as needed.  . insulin aspart protamine- aspart (NOVOLOG MIX 70/30) (70-30) 100 UNIT/ML injection Inject 40 Units into the skin daily with breakfast.  . Insulin Lispro (HUMALOG KWIKPEN [place]) Inject 10 Units into the skin at bedtime.  Marland Kitchen ipratropium (ATROVENT) 0.06 % nasal spray Place 2 sprays into both nostrils daily as needed for rhinitis.  Marland Kitchen isosorbide  mononitrate (IMDUR) 60 MG 24 hr tablet TAKE 1 TABLET BY MOUTH EVERY DAY  . metoprolol tartrate (LOPRESSOR) 50 MG tablet TAKE 1 TABLET BY MOUTH TWICE A DAY  . mometasone (NASONEX) 50 MCG/ACT nasal spray Place 2 sprays into the nose daily as needed (for allergies).  . Multiple Vitamin (MULTIVITAMIN) capsule Take 1 capsule by mouth daily.  . nitroGLYCERIN (NITROSTAT) 0.4 MG SL tablet PLACE 1 TABLET (0.4 MG TOTAL) UNDER THE TONGUE EVERY 5 (FIVE) MINUTES AS NEEDED FOR CHEST PAIN.  Marland Kitchen Omega-3 Fatty Acids (FISH OIL) 1000 MG CAPS Take 2,000 mg by mouth daily.  . potassium chloride (KLOR-CON) 10 MEQ tablet TAKE 1 TABLET BY MOUTH EVERY DAY  . PROAIR RESPICLICK 108 (90 Base) MCG/ACT AEPB Inhale 2 puffs into the lungs every 4 (four) hours as needed. (cough/wheezing)  . simvastatin (ZOCOR) 20 MG tablet TAKE 1 TABLET BY MOUTH EVERY DAY IN THE EVENING  . TRULICITY 1.5 MG/0.5ML SOPN every 7 (seven) days.   Marland Kitchen XIGDUO XR 5-500 MG TB24 Take 1 tablet by mouth 2 (two) times daily.   No current facility-administered medications for this visit. (Other)      REVIEW OF SYSTEMS:    ALLERGIES Allergies  Allergen Reactions  . Sulfa Antibiotics Nausea Only    PAST MEDICAL HISTORY Past Medical History:  Diagnosis Date  . Chest pain   . Diabetes mellitus without complication (HCC)   . GERD (gastroesophageal reflux disease)   . Hyperlipidemia   .  Hypertension   . Rheumatoid arthritis(714.0)    Past Surgical History:  Procedure Laterality Date  . CARDIAC CATHETERIZATION N/A 08/11/2015   Procedure: Left Heart Cath and Coronary Angiography;  Surgeon: Lyn Records, MD;  Location: [facility] INVASIVE CV LAB;  Service: Cardiovascular;  Laterality: N/A;  . CARDIAC CATHETERIZATION N/A 08/11/2015   Procedure: Coronary Stent Intervention;  Surgeon: Lyn Records, MD;  Location: [facility] INVASIVE CV LAB;  Service: Cardiovascular;  Laterality: N/A;  . CARPAL TUNNEL RELEASE Right   . CATARACT EXTRACTION Bilateral 2019  . CORONARY  ANGIOPLASTY WITH STENT PLACEMENT  2011   DES to the circumflex /notes 08/08/2015  . LEFT HEART CATH AND CORONARY ANGIOGRAPHY N/A 06/01/2018   Procedure: LEFT HEART CATH AND CORONARY ANGIOGRAPHY;  Surgeon: Swazil[place], Peter M, MD;  Location: [facility] INVASIVE CV LAB;  Service: Cardiovascular;  Laterality: N/A;  . NEUROPLASTY / TRANSPOSITION MEDIAN NERVE AT CARPAL TUNNEL Left   . ROUX-EN-Y GASTRIC BYPASS  2002  . TONSILLECTOMY      FAMILY HISTORY Family History  Problem Relation Age of Onset  . Diabetes Mother   . Hypertension Mother   . Diabetes Brother   . Hypertension Sister     SOCIAL HISTORY Social History   Tobacco Use  . Smoking status: Never Smoker  . Smokeless tobacco: Never Used  Substance Use Topics  . Alcohol use: No    Alcohol/week: 0.0 standard drinks  . Drug use: No         OPHTHALMIC EXAM:  Slit Lamp and Fundus Exam    External Exam      Right Left   External Normal Normal       Slit Lamp Exam      Right Left   Lids/Lashes Normal Normal   Conjunctiva/Sclera White and quiet White and quiet   Cornea Clear Clear   Anterior Chamber Deep and quiet Deep and quiet   Iris Round and reactive Round and reactive   Lens Clear Centered posterior chamber intraocular lens, 1+ Posterior capsular opacification   Anterior Vitreous Normal Normal       Fundus Exam      Right Left   Posterior Vitreous  Clear vitrectomized   C/D Ratio  0.3   Macula  Normal   Vessels  Old BRVO superotemporal, extra macular   Periphery  Good PRP temporal 270 degrees, small break at 3:00 found at surgery, anterior  , Well treated,   attached 360          IMAGING AND PROCEDURES  Imaging and Procedures for 03/03/20           ASSESSMENT/PLAN:  No problem-specific Assessment & Plan notes found for this encounter.      ICD-10-CM   1. Vitreous hemorrhage of left eye (HCC)  H43.12   2. Branch retinal vein occlusion with neovascularization of left eye  H34.8321   3. Postoperative  follow-up  Z09     1.  Patient instructed to commence with topical drops  Pred forte 1 drop OS 4 times daily  Ofloxacin 1 drop OS 4 times daily  Patient to use these medications for no more than 3 weeks.  2.  Patient understands limited activity heavy lifting bending straining and including treat constipation for the next 10 days and do not mash or rub the eye  3.  Ophthalmic Meds Ordered this visit:  No orders of the defined types were placed in this encounter.      Return in about 10 days (  around 03/13/2020) for POST OP, dilate, OS.  Patient Instructions  Patient instructed to contact the office for new onset visual acuity decline or distortion    Explained the diagnoses, plan, and follow up with the patient and they expressed understanding.  Patient expressed understanding of the importance of proper follow up care.   Alford Highland Mulki Roesler M.D. Diseases & Surgery of the Retina and Vitreous [facility] 03/03/20     Abbreviations: M myopia (nearsighted); A astigmatism; H hyperopia (farsighted); P presbyopia; Mrx spectacle prescription;  CTL contact lenses; OD right eye; OS left eye; OU both eyes  XT exotropia; ET esotropia; PEK punctate epithelial keratitis; PEE punctate epithelial erosions; DES dry eye syndrome; MGD meibomian gland dysfunction; ATs artificial tears; PFAT's preservative free artificial tears; NSC nuclear sclerotic cataract; PSC posterior subcapsular cataract; ERM epi-retinal membrane; PVD posterior vitreous detachment; RD retinal detachment; DM diabetes mellitus; DR diabetic retinopathy; NPDR non-proliferative diabetic retinopathy; PDR proliferative diabetic retinopathy; CSME clinically significant macular edema; DME diabetic macular edema; dbh dot blot hemorrhages; CWS cotton wool spot; POAG primary open angle glaucoma; C/D cup-to-disc ratio; HVF humphrey visual field; GVF goldmann visual field; OCT optical coherence tomography; IOP intraocular  pressure; BRVO Branch retinal vein occlusion; CRVO central retinal vein occlusion; CRAO central retinal artery occlusion; BRAO branch retinal artery occlusion; RT retinal tear; SB scleral buckle; PPV pars plana vitrectomy; VH Vitreous hemorrhage; PRP panretinal laser photocoagulation; IVK intravitreal kenalog; VMT vitreomacular traction; MH Macular hole;  NVD neovascularization of the disc; NVE neovascularization elsewhere; AREDS age related eye disease study; ARMD age related macular degeneration; POAG primary open angle glaucoma; EBMD epithelial/anterior basement membrane dystrophy; ACIOL anterior chamber intraocular lens; IOL intraocular lens; PCIOL posterior chamber intraocular lens; Phaco/IOL phacoemulsification with intraocular lens placement; PRK photorefractive keratectomy; LASIK laser assisted in situ keratomileusis; HTN hypertension; DM diabetes mellitus; COPD chronic obstructive pulmonary disease

## 2020-03-13 ENCOUNTER — Encounter (INDEPENDENT_AMBULATORY_CARE_PROVIDER_SITE_OTHER): Payer: Self-pay | Admitting: Ophthalmology

## 2020-03-13 ENCOUNTER — Ambulatory Visit (INDEPENDENT_AMBULATORY_CARE_PROVIDER_SITE_OTHER): Payer: Managed Care, Other (non HMO) | Admitting: Ophthalmology

## 2020-03-13 ENCOUNTER — Other Ambulatory Visit: Payer: Self-pay

## 2020-03-13 DIAGNOSIS — H348321 Tributary (branch) retinal vein occlusion, left eye, with retinal neovascularization: Secondary | ICD-10-CM

## 2020-03-13 DIAGNOSIS — Z09 Encounter for follow-up examination after completed treatment for conditions other than malignant neoplasm: Secondary | ICD-10-CM

## 2020-03-13 NOTE — Progress Notes (Signed)
03/13/2020     CHIEF COMPLAINT Patient presents for Post-op Follow-up   HISTORY OF PRESENT ILLNESS: Darlene Rivera is a 69 y.o. female who presents to the clinic today for:   HPI    Post-op Follow-up    In left eye.  Vision is improved.          Comments    1 Week Post Op OS, for vitreous hemorrhage related to retinal vascular disease as well as small retinal break.  Acuity improving.  Pt sts VA is improving OS. Pt sts she can still see some spots OS. Pt c/o intermittent itching OS.       Last edited by Edmon Crape, MD on 03/13/2020  9:28 AM. (History)      Referring physician: Renford Dills, MD [address] [place],  [place] [address]  HISTORICAL INFORMATION:   Selected notes from the MEDICAL RECORD NUMBER       CURRENT MEDICATIONS: Current Outpatient Medications (Ophthalmic Drugs)  Medication Sig  . prednisoLONE acetate (PRED FORTE) 1 % ophthalmic suspension Place 1 drop into the left eye 4 (four) times daily.   No current facility-administered medications for this visit. (Ophthalmic Drugs)   Current Outpatient Medications (Other)  Medication Sig  . allopurinol (ZYLOPRIM) 300 MG tablet Take 150 mg by mouth daily.   . Calcium Carbonate-Vitamin D (CALCIUM-VITAMIN D) 500-200 MG-UNIT per tablet Take 1 tablet by mouth 2 (two) times daily.   . cetirizine (ZYRTEC) 10 MG tablet Take 10 mg by mouth at bedtime.  . cholecalciferol (VITAMIN D) 1000 UNITS tablet Take 2,000 Units by mouth daily.  . clopidogrel (PLAVIX) 75 MG tablet TAKE 1 TABLET BY MOUTH EVERY DAY  . Cyanocobalamin (VITAMIN B-12 PO) Take 1 tablet by mouth daily.  . ferrous fumarate (HEMOCYTE - 106 MG FE) 325 (106 FE) MG TABS tablet Take 1 tablet by mouth daily before lunch.  . furosemide (LASIX) 40 MG tablet Take 40 mg by mouth daily.  Marland Kitchen ibuprofen (ADVIL,MOTRIN) 400 MG tablet Take 1 tablet (400 mg total) by mouth every 6 (six) hours as needed.  . insulin aspart protamine- aspart (NOVOLOG  MIX 70/30) (70-30) 100 UNIT/ML injection Inject 40 Units into the skin daily with breakfast.  . Insulin Lispro (HUMALOG KWIKPEN [place]) Inject 10 Units into the skin at bedtime.  Marland Kitchen ipratropium (ATROVENT) 0.06 % nasal spray Place 2 sprays into both nostrils daily as needed for rhinitis.  Marland Kitchen isosorbide mononitrate (IMDUR) 60 MG 24 hr tablet TAKE 1 TABLET BY MOUTH EVERY DAY  . metoprolol tartrate (LOPRESSOR) 50 MG tablet TAKE 1 TABLET BY MOUTH TWICE A DAY  . mometasone (NASONEX) 50 MCG/ACT nasal spray Place 2 sprays into the nose daily as needed (for allergies).  . Multiple Vitamin (MULTIVITAMIN) capsule Take 1 capsule by mouth daily.  . nitroGLYCERIN (NITROSTAT) 0.4 MG SL tablet PLACE 1 TABLET (0.4 MG TOTAL) UNDER THE TONGUE EVERY 5 (FIVE) MINUTES AS NEEDED FOR CHEST PAIN.  Marland Kitchen Omega-3 Fatty Acids (FISH OIL) 1000 MG CAPS Take 2,000 mg by mouth daily.  . potassium chloride (KLOR-CON) 10 MEQ tablet TAKE 1 TABLET BY MOUTH EVERY DAY  . PROAIR RESPICLICK 108 (90 Base) MCG/ACT AEPB Inhale 2 puffs into the lungs every 4 (four) hours as needed. (cough/wheezing)  . simvastatin (ZOCOR) 20 MG tablet TAKE 1 TABLET BY MOUTH EVERY DAY IN THE EVENING  . TRULICITY 1.5 MG/0.5ML SOPN every 7 (seven) days.   Marland Kitchen XIGDUO XR 5-500 MG TB24 Take 1  tablet by mouth 2 (two) times daily.   No current facility-administered medications for this visit. (Other)      REVIEW OF SYSTEMS:    ALLERGIES Allergies  Allergen Reactions  . Sulfa Antibiotics Nausea Only    PAST MEDICAL HISTORY Past Medical History:  Diagnosis Date  . Chest pain   . Diabetes mellitus without complication (HCC)   . GERD (gastroesophageal reflux disease)   . Hyperlipidemia   . Hypertension   . Rheumatoid arthritis(714.0)    Past Surgical History:  Procedure Laterality Date  . CARDIAC CATHETERIZATION N/A 08/11/2015   Procedure: Left Heart Cath and Coronary Angiography;  Surgeon: Lyn Records, MD;  Location: [facility] INVASIVE CV LAB;  Service:  Cardiovascular;  Laterality: N/A;  . CARDIAC CATHETERIZATION N/A 08/11/2015   Procedure: Coronary Stent Intervention;  Surgeon: Lyn Records, MD;  Location: [facility] INVASIVE CV LAB;  Service: Cardiovascular;  Laterality: N/A;  . CARPAL TUNNEL RELEASE Right   . CATARACT EXTRACTION Bilateral 2019  . CORONARY ANGIOPLASTY WITH STENT PLACEMENT  2011   DES to the circumflex /notes 08/08/2015  . LEFT HEART CATH AND CORONARY ANGIOGRAPHY N/A 06/01/2018   Procedure: LEFT HEART CATH AND CORONARY ANGIOGRAPHY;  Surgeon: Swaziland, Peter M, MD;  Location: [facility] INVASIVE CV LAB;  Service: Cardiovascular;  Laterality: N/A;  . NEUROPLASTY / TRANSPOSITION MEDIAN NERVE AT CARPAL TUNNEL Left   . ROUX-EN-Y GASTRIC BYPASS  2002  . TONSILLECTOMY      FAMILY HISTORY Family History  Problem Relation Age of Onset  . Diabetes Mother   . Hypertension Mother   . Diabetes Brother   . Hypertension Sister     SOCIAL HISTORY Social History   Tobacco Use  . Smoking status: Never Smoker  . Smokeless tobacco: Never Used  Substance Use Topics  . Alcohol use: No    Alcohol/week: 0.0 standard drinks  . Drug use: No         OPHTHALMIC EXAM:  Base Eye Exam    Visual Acuity (ETDRS)      Right Left   Dist cc 20/25 -2 20/20 -2   Correction: Glasses       Tonometry (Tonopen, 8:25 AM)      Right Left   Pressure 11 17       Pupils      Pupils Dark Light Shape React APD   Right PERRL 3 3 Round Minimal None   Left PERRL 3 3 Round Minimal None       Visual Fields (Counting fingers)      Left Right    Full Full       Extraocular Movement      Right Left    Full Full       Neuro/Psych    Oriented x3: Yes   Mood/Affect: Normal       Dilation    Left eye: 1.0% Mydriacyl, 2.5% Phenylephrine @ 8:27 AM        Slit Lamp and Fundus Exam    External Exam      Right Left   External Normal Normal       Slit Lamp Exam      Right Left   Lids/Lashes Normal Normal   Conjunctiva/Sclera White and quiet White  and quiet   Cornea Clear Clear   Anterior Chamber Deep and quiet Deep and quiet   Iris Round and reactive Round and reactive   Lens Clear Centered posterior chamber intraocular lens, 1+ Posterior capsular opacification   Anterior  Vitreous Normal Normal       Fundus Exam      Right Left   Posterior Vitreous  Clear vitrectomized   Disc  Normal   C/D Ratio  0.3   Macula  Normal   Vessels  Old BRVO superotemporal, extra macular   Periphery  Good PRP temporal 270 degrees, small break at 3:00 found at surgery, anterior  , Well treated,   attached 360          IMAGING AND PROCEDURES  Imaging and Procedures for 03/13/20           ASSESSMENT/PLAN:  Branch retinal vein occlusion with neovascularization of left eye Now likely involutional OS secondary to sectoral PRP delivered at the time of vitrectomy  Postoperative follow-up OS, clear media, excellent visual acuity      ICD-10-CM   1. Branch retinal vein occlusion with neovascularization of left eye  H34.8321   2. Postoperative follow-up  Z09     1.  Much improved vitreous hemorrhage, quiescent proliferative disease from regional branch retinal vein occlusion extra macular OS each quiet  2.  Findings reviewed with the patient.  3.  Patient to complete topical medications in 2 weeks, or sooner if completed.  No refills required  Ophthalmic Meds Ordered this visit:  No orders of the defined types were placed in this encounter.      Return in about 10 weeks (around 05/22/2020) for COLOR FP, dilate, OS.  Patient Instructions  No lifting and bending for 1 week. No water in the eye for 10 days. Do not rub the eye. Wear shield at night for 1-3 days.  Wear your CPAP as normal, if instructed by your doctor.  Continue your topical medications for a total of 3 weeks.  Do not refill your postoperative medications unless instructed.  Patient return to full activity in 5 more days without restriction    Explained the  diagnoses, plan, and follow up with the patient and they expressed understanding.  Patient expressed understanding of the importance of proper follow up care.   Alford Highland Benjiman Sedgwick M.D. Diseases & Surgery of the Retina and Vitreous [facility] 03/13/20     Abbreviations: M myopia (nearsighted); A astigmatism; H hyperopia (farsighted); P presbyopia; Mrx spectacle prescription;  CTL contact lenses; OD right eye; OS left eye; OU both eyes  XT exotropia; ET esotropia; PEK punctate epithelial keratitis; PEE punctate epithelial erosions; DES dry eye syndrome; MGD meibomian gland dysfunction; ATs artificial tears; PFAT's preservative free artificial tears; NSC nuclear sclerotic cataract; PSC posterior subcapsular cataract; ERM epi-retinal membrane; PVD posterior vitreous detachment; RD retinal detachment; DM diabetes mellitus; DR diabetic retinopathy; NPDR non-proliferative diabetic retinopathy; PDR proliferative diabetic retinopathy; CSME clinically significant macular edema; DME diabetic macular edema; dbh dot blot hemorrhages; CWS cotton wool spot; POAG primary open angle glaucoma; C/D cup-to-disc ratio; HVF humphrey visual field; GVF goldmann visual field; OCT optical coherence tomography; IOP intraocular pressure; BRVO Branch retinal vein occlusion; CRVO central retinal vein occlusion; CRAO central retinal artery occlusion; BRAO branch retinal artery occlusion; RT retinal tear; SB scleral buckle; PPV pars plana vitrectomy; VH Vitreous hemorrhage; PRP panretinal laser photocoagulation; IVK intravitreal kenalog; VMT vitreomacular traction; MH Macular hole;  NVD neovascularization of the disc; NVE neovascularization elsewhere; AREDS age related eye disease study; ARMD age related macular degeneration; POAG primary open angle glaucoma; EBMD epithelial/anterior basement membrane dystrophy; ACIOL anterior chamber intraocular lens; IOL intraocular lens; PCIOL posterior chamber intraocular lens;  Phaco/IOL phacoemulsification with intraocular  lens placement; [place] photorefractive keratectomy; LASIK laser assisted in situ keratomileusis; HTN hypertension; DM diabetes mellitus; COPD chronic obstructive pulmonary disease

## 2020-03-13 NOTE — Patient Instructions (Signed)
No lifting and bending for 1 week. No water in the eye for 10 days. Do not rub the eye. Wear shield at night for 1-3 days.  Wear your CPAP as normal, if instructed by your doctor.  Continue your topical medications for a total of 3 weeks.  Do not refill your postoperative medications unless instructed.  Patient return to full activity in 5 more days without restriction

## 2020-03-13 NOTE — Assessment & Plan Note (Signed)
OS, clear media, excellent visual acuity

## 2020-03-13 NOTE — Assessment & Plan Note (Signed)
Now likely involutional OS secondary to sectoral PRP delivered at the time of vitrectomy

## 2020-03-24 ENCOUNTER — Other Ambulatory Visit: Payer: Self-pay | Admitting: Interventional Cardiology

## 2020-04-12 ENCOUNTER — Other Ambulatory Visit: Payer: Self-pay | Admitting: Interventional Cardiology

## 2020-05-12 ENCOUNTER — Other Ambulatory Visit: Payer: Self-pay | Admitting: Nurse Practitioner

## 2020-05-22 ENCOUNTER — Encounter (INDEPENDENT_AMBULATORY_CARE_PROVIDER_SITE_OTHER): Payer: Self-pay | Admitting: Ophthalmology

## 2020-05-22 ENCOUNTER — Ambulatory Visit (INDEPENDENT_AMBULATORY_CARE_PROVIDER_SITE_OTHER): Payer: Managed Care, Other (non HMO) | Admitting: Ophthalmology

## 2020-05-22 ENCOUNTER — Other Ambulatory Visit: Payer: Self-pay

## 2020-05-22 DIAGNOSIS — H348321 Tributary (branch) retinal vein occlusion, left eye, with retinal neovascularization: Secondary | ICD-10-CM

## 2020-05-22 DIAGNOSIS — E118 Type 2 diabetes mellitus with unspecified complications: Secondary | ICD-10-CM

## 2020-05-22 DIAGNOSIS — H33312 Horseshoe tear of retina without detachment, left eye: Secondary | ICD-10-CM

## 2020-05-22 DIAGNOSIS — H4312 Vitreous hemorrhage, left eye: Secondary | ICD-10-CM

## 2020-05-22 DIAGNOSIS — H348322 Tributary (branch) retinal vein occlusion, left eye, stable: Secondary | ICD-10-CM

## 2020-05-22 NOTE — Assessment & Plan Note (Signed)
No diabetic eye disease at this time

## 2020-05-22 NOTE — Assessment & Plan Note (Signed)
OS, vitreous hemorrhage now completely cleared, resolved status post vitrectomy

## 2020-05-22 NOTE — Assessment & Plan Note (Signed)
Well treated 

## 2020-05-22 NOTE — Progress Notes (Signed)
05/22/2020     CHIEF COMPLAINT Patient presents for Post-op Follow-up   HISTORY OF PRESENT ILLNESS: Darlene Rivera is a 69 y.o. female who presents to the clinic today for:   HPI    Post-op Follow-up    In left eye.  Discomfort includes none.  Vision is stable.  I, the attending physician,  performed the HPI with the patient and updated documentation appropriately.          Comments    10 Week post op OS. FP  Pt states OS is doing well. Denies any changes in vision. BGL:158 A1C: 7.9       Last edited by Elyse Jarvis on 05/22/2020  8:08 AM. (History)      Referring physician: Renford Dills, MD [address] [place],  [place] [address]  HISTORICAL INFORMATION:   Selected notes from the MEDICAL RECORD NUMBER       CURRENT MEDICATIONS: Current Outpatient Medications (Ophthalmic Drugs)  Medication Sig  . prednisoLONE acetate (PRED FORTE) 1 % ophthalmic suspension Place 1 drop into the left eye 4 (four) times daily.   No current facility-administered medications for this visit. (Ophthalmic Drugs)   Current Outpatient Medications (Other)  Medication Sig  . allopurinol (ZYLOPRIM) 300 MG tablet Take 150 mg by mouth daily.   . Calcium Carbonate-Vitamin D (CALCIUM-VITAMIN D) 500-200 MG-UNIT per tablet Take 1 tablet by mouth 2 (two) times daily.   . cetirizine (ZYRTEC) 10 MG tablet Take 10 mg by mouth at bedtime.  . cholecalciferol (VITAMIN D) 1000 UNITS tablet Take 2,000 Units by mouth daily.  . clopidogrel (PLAVIX) 75 MG tablet TAKE 1 TABLET BY MOUTH EVERY DAY  . Cyanocobalamin (VITAMIN B-12 PO) Take 1 tablet by mouth daily.  . ferrous fumarate (HEMOCYTE - 106 MG FE) 325 (106 FE) MG TABS tablet Take 1 tablet by mouth daily before lunch.  . furosemide (LASIX) 40 MG tablet Take 40 mg by mouth daily.  Marland Kitchen ibuprofen (ADVIL,MOTRIN) 400 MG tablet Take 1 tablet (400 mg total) by mouth every 6 (six) hours as needed.  . insulin aspart protamine- aspart  (NOVOLOG MIX 70/30) (70-30) 100 UNIT/ML injection Inject 40 Units into the skin daily with breakfast.  . Insulin Lispro (HUMALOG KWIKPEN [place]) Inject 10 Units into the skin at bedtime.  Marland Kitchen ipratropium (ATROVENT) 0.06 % nasal spray Place 2 sprays into both nostrils daily as needed for rhinitis.  Marland Kitchen isosorbide mononitrate (IMDUR) 60 MG 24 hr tablet TAKE 1 TABLET BY MOUTH EVERY DAY  . metoprolol tartrate (LOPRESSOR) 50 MG tablet TAKE 1 TABLET BY MOUTH TWICE A DAY  . mometasone (NASONEX) 50 MCG/ACT nasal spray Place 2 sprays into the nose daily as needed (for allergies).  . Multiple Vitamin (MULTIVITAMIN) capsule Take 1 capsule by mouth daily.  . nitroGLYCERIN (NITROSTAT) 0.4 MG SL tablet PLACE 1 TABLET (0.4 MG TOTAL) UNDER THE TONGUE EVERY 5 (FIVE) MINUTES AS NEEDED FOR CHEST PAIN.  Marland Kitchen Omega-3 Fatty Acids (FISH OIL) 1000 MG CAPS Take 2,000 mg by mouth daily.  . potassium chloride (KLOR-CON) 10 MEQ tablet TAKE 1 TABLET BY MOUTH EVERY DAY  . PROAIR RESPICLICK 108 (90 Base) MCG/ACT AEPB Inhale 2 puffs into the lungs every 4 (four) hours as needed. (cough/wheezing)  . simvastatin (ZOCOR) 20 MG tablet TAKE 1 TABLET BY MOUTH EVERY DAY IN THE EVENING  . TRULICITY 1.5 MG/0.5ML SOPN every 7 (seven) days.   Marland Kitchen XIGDUO XR 5-500 MG TB24 Take 1 tablet by mouth  2 (two) times daily.   No current facility-administered medications for this visit. (Other)      REVIEW OF SYSTEMS: ROS    Positive for: Endocrine   Last edited by Elyse Jarvis on 05/22/2020  8:08 AM. (History)       ALLERGIES Allergies  Allergen Reactions  . Sulfa Antibiotics Nausea Only    PAST MEDICAL HISTORY Past Medical History:  Diagnosis Date  . Chest pain   . Diabetes mellitus without complication (HCC)   . GERD (gastroesophageal reflux disease)   . Hyperlipidemia   . Hypertension   . Rheumatoid arthritis(714.0)    Past Surgical History:  Procedure Laterality Date  . CARDIAC CATHETERIZATION N/A 08/11/2015   Procedure: Left  Heart Cath and Coronary Angiography;  Surgeon: Lyn Records, MD;  Location: [facility] INVASIVE CV LAB;  Service: Cardiovascular;  Laterality: N/A;  . CARDIAC CATHETERIZATION N/A 08/11/2015   Procedure: Coronary Stent Intervention;  Surgeon: Lyn Records, MD;  Location: [facility] INVASIVE CV LAB;  Service: Cardiovascular;  Laterality: N/A;  . CARPAL TUNNEL RELEASE Right   . CATARACT EXTRACTION Bilateral 2019  . CORONARY ANGIOPLASTY WITH STENT PLACEMENT  2011   DES to the circumflex /notes 08/08/2015  . LEFT HEART CATH AND CORONARY ANGIOGRAPHY N/A 06/01/2018   Procedure: LEFT HEART CATH AND CORONARY ANGIOGRAPHY;  Surgeon: [place], Peter M, MD;  Location: [facility] INVASIVE CV LAB;  Service: Cardiovascular;  Laterality: N/A;  . NEUROPLASTY / TRANSPOSITION MEDIAN NERVE AT CARPAL TUNNEL Left   . ROUX-EN-Y GASTRIC BYPASS  2002  . TONSILLECTOMY      FAMILY HISTORY Family History  Problem Relation Age of Onset  . Diabetes Mother   . Hypertension Mother   . Diabetes Brother   . Hypertension Sister     SOCIAL HISTORY Social History   Tobacco Use  . Smoking status: Never Smoker  . Smokeless tobacco: Never Used  Substance Use Topics  . Alcohol use: No    Alcohol/week: 0.0 standard drinks  . Drug use: No         OPHTHALMIC EXAM: Base Eye Exam    Visual Acuity (Snellen - Linear)      Right Left   Dist cc 20/25 20/25   Correction: Glasses       Tonometry (Tonopen, 8:11 AM)      Right Left   Pressure 15 14       Pupils      Pupils Dark Light Shape React APD   Right PERRL 3 3 Round Minimal None   Left PERRL 3 3 Round Minimal None       Visual Fields (Counting fingers)      Left Right    Full Full       Neuro/Psych    Oriented x3: Yes   Mood/Affect: Normal       Dilation    Left eye: 1.0% Mydriacyl, 2.5% Phenylephrine @ 8:11 AM        Slit Lamp and Fundus Exam    External Exam      Right Left   External Normal Normal       Slit Lamp Exam      Right Left   Lids/Lashes Normal  Normal   Conjunctiva/Sclera White and quiet White and quiet   Cornea Clear Clear   Anterior Chamber Deep and quiet Deep and quiet   Iris Round and reactive Round and reactive   Lens Centered posterior chamber intraocular lens, 1+ Posterior capsular opacification Centered posterior chamber intraocular  lens, 1+ Posterior capsular opacification   Anterior Vitreous Normal Normal       Fundus Exam      Right Left   Posterior Vitreous  Clear vitrectomized   Disc  Normal   C/D Ratio  0.3   Macula  Normal   Vessels  Old BRVO superotemporal, extra macular , no nv   Periphery  Good PRP temporal 270 degrees, small break at 3:00 found at surgery, anterior  , Well treated,   attached 360          IMAGING AND PROCEDURES  Imaging and Procedures for 05/22/20  Color Fundus Photography Optos - OU - Both Eyes       Right Eye Progression has been stable. Disc findings include normal observations. Macula : normal observations. Vessels : normal observations. Periphery : normal observations.   Left Eye Progression has improved. Disc findings include normal observations.   Notes OS, clear media, cleared vitreous hemorrhage status post vitrectomy for good heme associated with BRVO, extra macular NVE, now with excellent acuity                ASSESSMENT/PLAN:  Vitreous hemorrhage of left eye (HCC) OS, vitreous hemorrhage now completely cleared, resolved status post vitrectomy  Retinal tear, left Well treated  Diabetes mellitus with complication (HCC) No diabetic eye disease at this time      ICD-10-CM   1. Branch retinal vein occlusion with neovascularization of left eye  H34.8321 Color Fundus Photography Optos - OU - Both Eyes  2. Stable branch retinal vein occlusion of left eye  H34.8322   3. Vitreous hemorrhage of left eye (HCC)  H43.12   4. Retinal tear, left  H33.312   5. Diabetes mellitus with complication (HCC)  E11.8     1.  Excellent visual acuity left eye 2.5 months  status post vitrectomy, laser photocoagulation for extra macular branch retinal vein occlusion with small neovascularization elsewhere, and retinal tear.  Now with good laser retinopexy and ablation.  Clear media in excellent acuity OS  2.  Mild posterior capsule opacity OU, will ask patient to follow-up with Dr. Tyrone Schimke  3.  Follow-up here in 1 year or as needed  Ophthalmic Meds Ordered this visit:  No orders of the defined types were placed in this encounter.      Return in about 1 year (around 05/22/2021) for DILATE OU, COLOR FP.  There are no Patient Instructions on file for this visit.   Explained the diagnoses, plan, and follow up with the patient and they expressed understanding.  Patient expressed understanding of the importance of proper follow up care.   Alford Highland Francis Yardley M.D. Diseases & Surgery of the Retina and Vitreous [facility] 05/22/20     Abbreviations: M myopia (nearsighted); A astigmatism; H hyperopia (farsighted); P presbyopia; Mrx spectacle prescription;  CTL contact lenses; OD right eye; OS left eye; OU both eyes  XT exotropia; ET esotropia; PEK punctate epithelial keratitis; PEE punctate epithelial erosions; DES dry eye syndrome; MGD meibomian gland dysfunction; ATs artificial tears; PFAT's preservative free artificial tears; NSC nuclear sclerotic cataract; PSC posterior subcapsular cataract; ERM epi-retinal membrane; PVD posterior vitreous detachment; RD retinal detachment; DM diabetes mellitus; DR diabetic retinopathy; NPDR non-proliferative diabetic retinopathy; PDR proliferative diabetic retinopathy; CSME clinically significant macular edema; DME diabetic macular edema; dbh dot blot hemorrhages; CWS cotton wool spot; POAG primary open angle glaucoma; C/D cup-to-disc ratio; HVF humphrey visual field; GVF goldmann visual field; OCT optical coherence  tomography; IOP intraocular pressure; BRVO Branch retinal vein occlusion; CRVO central  retinal vein occlusion; CRAO central retinal artery occlusion; BRAO branch retinal artery occlusion; RT retinal tear; SB scleral buckle; PPV pars plana vitrectomy; VH Vitreous hemorrhage; PRP panretinal laser photocoagulation; IVK intravitreal kenalog; VMT vitreomacular traction; MH Macular hole;  NVD neovascularization of the disc; NVE neovascularization elsewhere; AREDS age related eye disease study; ARMD age related macular degeneration; POAG primary open angle glaucoma; EBMD epithelial/anterior basement membrane dystrophy; ACIOL anterior chamber intraocular lens; IOL intraocular lens; PCIOL posterior chamber intraocular lens; Phaco/IOL phacoemulsification with intraocular lens placement; PRK photorefractive keratectomy; LASIK laser assisted in situ keratomileusis; HTN hypertension; DM diabetes mellitus; COPD chronic obstructive pulmonary disease

## 2020-07-24 ENCOUNTER — Other Ambulatory Visit: Payer: Self-pay | Admitting: Nurse Practitioner

## 2020-09-25 ENCOUNTER — Other Ambulatory Visit: Payer: Self-pay | Admitting: Interventional Cardiology

## 2020-09-25 MED ORDER — NITROGLYCERIN 0.4 MG SL SUBL: 0.4000 mg | SUBLINGUAL_TABLET | SUBLINGUAL | 7 refills | Status: DC | PRN

## 2020-10-29 NOTE — H&P (View-Only) (Signed)
Cardiology Office Note:    Date:  10/30/2020   ID:  Darlene Rivera, DOB 08-26-1950, MRN [id number]  PCP:  Renford Dills, MD  Cardiologist:  Lesleigh Noe, MD   Referring MD: Renford Dills, MD   No chief complaint on file.   History of Present Illness:    Darlene Rivera is a 70 y.o. female with a hx of  CAD with remote DESincircumflex, recent LAD diagonal bifurcation intervention(07/2015)with provisional stenting of LAD and angioplasty of diagonal with excellent result, hypertension, hyperlipidemia, and diabetes.  [name] is experiencing angina several times a week.  It can occur spontaneously but also occurs with physical activity.  On her last visit here several months ago, I asked that she try sublingual nitroglycerin for episodes that occur at rest.  She has noted a definite improvement in in time of resolution and response to sublingual nitroglycerin on multiple occasions.  She has at least 3 episodes per week.  She modulates physical activity to stay under her anginal threshold.  Past Medical History:  Diagnosis Date  . Chest pain   . Diabetes mellitus without complication (HCC)   . GERD (gastroesophageal reflux disease)   . Hyperlipidemia   . Hypertension   . Rheumatoid arthritis(714.0)     Past Surgical History:  Procedure Laterality Date  . CARDIAC CATHETERIZATION N/A 08/11/2015   Procedure: Left Heart Cath and Coronary Angiography;  Surgeon: Lyn Records, MD;  Location: [facility] INVASIVE CV LAB;  Service: Cardiovascular;  Laterality: N/A;  . CARDIAC CATHETERIZATION N/A 08/11/2015   Procedure: Coronary Stent Intervention;  Surgeon: Lyn Records, MD;  Location: [facility] INVASIVE CV LAB;  Service: Cardiovascular;  Laterality: N/A;  . CARPAL TUNNEL RELEASE Right   . CATARACT EXTRACTION Bilateral 2019  . CORONARY ANGIOPLASTY WITH STENT PLACEMENT  2011   DES to the circumflex /notes 08/08/2015  . LEFT HEART CATH AND CORONARY ANGIOGRAPHY N/A 06/01/2018   Procedure: LEFT  HEART CATH AND CORONARY ANGIOGRAPHY;  Surgeon: [place], Peter M, MD;  Location: [facility] INVASIVE CV LAB;  Service: Cardiovascular;  Laterality: N/A;  . NEUROPLASTY / TRANSPOSITION MEDIAN NERVE AT CARPAL TUNNEL Left   . ROUX-EN-Y GASTRIC BYPASS  2002  . TONSILLECTOMY      Current Medications: Current Meds  Medication Sig  . allopurinol (ZYLOPRIM) 300 MG tablet Take 150 mg by mouth daily.   . Calcium Carbonate-Vitamin D (CALCIUM-VITAMIN D) 500-200 MG-UNIT per tablet Take 1 tablet by mouth 2 (two) times daily.   . cetirizine (ZYRTEC) 10 MG tablet Take 10 mg by mouth at bedtime.  . cholecalciferol (VITAMIN D) 1000 UNITS tablet Take 2,000 Units by mouth daily.  . clopidogrel (PLAVIX) 75 MG tablet TAKE 1 TABLET BY MOUTH EVERY DAY  . Cyanocobalamin (VITAMIN B-12 PO) Take 1 tablet by mouth daily.  . ferrous fumarate (HEMOCYTE - 106 MG FE) 325 (106 FE) MG TABS tablet Take 1 tablet by mouth daily before lunch.  . furosemide (LASIX) 40 MG tablet Take 40 mg by mouth daily.  Marland Kitchen ibuprofen (ADVIL,MOTRIN) 400 MG tablet Take 1 tablet (400 mg total) by mouth every 6 (six) hours as needed.  . insulin aspart protamine- aspart (NOVOLOG MIX 70/30) (70-30) 100 UNIT/ML injection Inject 40 Units into the skin daily with breakfast.  . Insulin Lispro (HUMALOG KWIKPEN [place]) Inject 10 Units into the skin at bedtime.  Marland Kitchen ipratropium (ATROVENT) 0.06 % nasal spray Place 2 sprays into both nostrils daily as needed for rhinitis.  Marland Kitchen isosorbide mononitrate (IMDUR) 60 MG  24 hr tablet TAKE 1 TABLET BY MOUTH EVERY DAY  . losartan (COZAAR) 50 MG tablet Take 1 tablet (50 mg total) by mouth daily.  . metoprolol tartrate (LOPRESSOR) 50 MG tablet TAKE 1 TABLET BY MOUTH TWICE A DAY  . mometasone (NASONEX) 50 MCG/ACT nasal spray Place 2 sprays into the nose daily as needed (for allergies).  . Multiple Vitamin (MULTIVITAMIN) capsule Take 1 capsule by mouth daily.  . nitroGLYCERIN (NITROSTAT) 0.4 MG SL tablet Place 1 tablet (0.4 mg total) under  the tongue every 5 (five) minutes as needed for chest pain.  . Omega-3 Fatty Acids (FISH OIL) 1000 MG CAPS Take 2,000 mg by mouth daily.  . potassium chloride (KLOR-CON) 10 MEQ tablet TAKE 1 TABLET BY MOUTH EVERY DAY  . PROAIR RESPICLICK 108 (90 Base) MCG/ACT AEPB Inhale 2 puffs into the lungs every 4 (four) hours as needed. (cough/wheezing)  . rosuvastatin (CRESTOR) 20 MG tablet Take 1 tablet (20 mg total) by mouth daily.  . TRULICITY 1.5 MG/0.5ML SOPN every 7 (seven) days.   Marland Kitchen XIGDUO XR 5-500 MG TB24 Take 1 tablet by mouth 2 (two) times daily.  . [DISCONTINUED] losartan (COZAAR) 25 MG tablet Take 25 mg by mouth daily.  . [DISCONTINUED] simvastatin (ZOCOR) 20 MG tablet TAKE 1 TABLET BY MOUTH EVERY DAY IN THE EVENING     Allergies:   Sulfa antibiotics   Social History   Socioeconomic History  . Marital status: Single    Spouse name: Not on file  . Number of children: Not on file  . Years of education: Not on file  . Highest education level: Not on file  Occupational History  . Not on file  Tobacco Use  . Smoking status: Never Smoker  . Smokeless tobacco: Never Used  Substance and Sexual Activity  . Alcohol use: No    Alcohol/week: 0.0 standard drinks  . Drug use: No  . Sexual activity: Not on file  Other Topics Concern  . Not on file  Social History Narrative  . Not on file   Social Determinants of Health   Financial Resource Strain: Not on file  Food Insecurity: Not on file  Transportation Needs: Not on file  Physical Activity: Not on file  Stress: Not on file  Social Connections: Not on file     Family History: The patient's family history includes Diabetes in her brother and mother; Hypertension in her mother and sister.  ROS:   Please see the history of present illness.    She is gaining weight.  She is not exercising.  Risk factors are not ideally controlled as A1c is 7.6 LDL is 81 and weight is elevated.  All other systems reviewed and are  negative.  EKGs/Labs/Other Studies Reviewed:    The following studies were reviewed today: Recent laboratory data: October 2021  LDL is 81  Total cholesterol is 189  Triglyceride 109 hemoglobin A1c 7.6    EKG:  EKG normal sinus rhythm with normal EKG appearance  Recent Labs: No results found for requested labs within last 8760 hours.  Recent Lipid Panel No results found for: CHOL, TRIG, HDL, CHOLHDL, VLDL, LDLCALC, LDLDIRECT  Physical Exam:    VS:  BP (!) 162/72   Pulse 86   Ht 5\' 2"  (1.575 m)   Wt 207 lb 3.2 oz (94 kg)   SpO2 98%   BMI 37.90 kg/m     Wt Readings from Last 3 Encounters:  10/30/20 207 lb 3.2 oz (94 kg)  09/13/19 207 lb 12.8 oz (94.3 kg)  03/15/19 218 lb 12.8 oz (99.2 kg)     GEN: Morbid obesity. No acute distress HEENT: Normal NECK: No JVD. LYMPHATICS: No lymphadenopathy CARDIAC: No murmur. RRR no gallop, or edema. VASCULAR:  Normal Pulses. No bruits. RESPIRATORY:  Clear to auscultation without rales, wheezing or rhonchi  ABDOMEN: Soft, non-tender, non-distended, No pulsatile mass, MUSCULOSKELETAL: No deformity  SKIN: Warm and dry NEUROLOGIC:  Alert and oriented x 3 PSYCHIATRIC:  Normal affect   ASSESSMENT:    1. Atherosclerosis of native coronary artery of native heart with stable angina pectoris (HCC)   2. Other hyperlipidemia   3. Essential hypertension   4. Bilateral carotid artery stenosis   5. Elevated liver enzymes   6. Angina pectoris (HCC)    PLAN:    In order of problems listed above:  1. Angina pectoris is progressing.  He is limiting/impairing quality of life.  Right knee to 50 mg/day for better blood pressure control.  I have recommended coronary angiography. 2. Increase intensity of statin therapy by switching to rosuvastatin 20 mg/day. 3. Increase losartan to 50 mg/day.  Target 130/80.  Repeat blood pressure today was 150/80 mmHg.  Losartan was recently started by Dr. Nehemiah Settle. 4. Secondary prevention discussed 5. We  will do a lipid panel with liver after change in statin intensity. 6. The patient was counseled to undergo left heart catheterization, coronary angiography, and possible percutaneous coronary intervention with stent implantation. The procedural risks and benefits were discussed in detail. The risks discussed included death, stroke, myocardial infarction, life-threatening bleeding, limb ischemia, kidney injury, allergy, and possible emergency cardiac surgery. The risk of these significant complications were estimated to occur less than 1% of the time. After discussion, the patient has agreed to proceed. 7.    Medication Adjustments/Labs and Tests Ordered: Current medicines are reviewed at length with the patient today.  Concerns regarding medicines are outlined above.  Orders Placed This Encounter  Procedures  . Basic metabolic panel  . CBC  . EKG 12-Lead   Meds ordered this encounter  Medications  . losartan (COZAAR) 50 MG tablet    Sig: Take 1 tablet (50 mg total) by mouth daily.    Dispense:  90 tablet    Refill:  3    Dose change  . rosuvastatin (CRESTOR) 20 MG tablet    Sig: Take 1 tablet (20 mg total) by mouth daily.    Dispense:  90 tablet    Refill:  3    D/c Simvastatin    Patient Instructions  Medication Instructions:  1) DISCONTINUE Simvastatin 2) START Rosuvastatin 20mg  once daily 3) INCREASE Losartan to 50mg  once daily  *If you need a refill on your cardiac medications before your next appointment, please call your pharmacy*   Lab Work: BMET and CBC today  If you have labs (blood work) drawn today and your tests are completely normal, you will receive your results only by: Marland Kitchen MyChart Message (if you have MyChart) OR . A paper copy in the mail If you have any lab test that is abnormal or we need to change your treatment, we will call you to review the results.   Testing/Procedures: Your physician has requested that you have a cardiac catheterization. Cardiac  catheterization is used to diagnose and/or treat various heart conditions. Doctors may recommend this procedure for a number of different reasons. The most common reason is to evaluate chest pain. Chest pain can be a symptom of coronary artery  disease (CAD), and cardiac catheterization can show whether plaque is narrowing or blocking your heart's arteries. This procedure is also used to evaluate the valves, as well as measure the blood flow and oxygen levels in different parts of your heart. For further information please visit [email or web address] Please follow instruction sheet, as given.    Follow-Up: At [facility], you and your health needs are our priority.  As part of our continuing mission to provide you with exceptional heart care, we have created designated Provider Care Teams.  These Care Teams include your primary Cardiologist (physician) and Advanced Practice Providers (APPs -  Physician Assistants and Nurse Practitioners) who all work together to provide you with the care you need, when you need it.  We recommend signing up for the patient portal called "MyChart".  Sign up information is provided on this After Visit Summary.  MyChart is used to connect with patients for Virtual Visits (Telemedicine).  Patients are able to view lab/test results, encounter notes, upcoming appointments, etc.  Non-urgent messages can be sent to your provider as well.   To learn more about what you can do with MyChart, go to [email or web address].    Your next appointment:   2-3 week(s)  The format for your next appointment:   In Person  Provider:   You may see Lesleigh Noe, MD or one of the following Advanced Practice Providers on your designated Care Team:    Georgie Chard, NP    Other Instructions  Due to recent COVID-19 restrictions implemented by our local and state authorities and in an effort to keep both patients and staff as safe as possible, our hospital system requires COVID-19  testing prior to certain scheduled hospital procedures.  Please go to [address] [place], [place] [address] on Friday, May 20th at 8:20am  .  This is a drive up testing site.  You will not need to exit your vehicle. You must agree to self-quarantine from the time of your testing until the procedure date on Tuesday, May 24th.  This should included staying home with ONLY the people you live with.  Avoid take-out, grocery store shopping or leaving the house for any non-emergent reason.  Failure to have your COVID-19 test done on the date and time you have been scheduled will result in cancellation of your procedure.  Please call our office at [phone] if you have any questions.     [facility] CARDIOVASCULAR DIVISION [facility] OFFICE [address] [place], [address] [place] [address] Dept: [phone] Loc: [phone]  SHERISSE SUDLER  10/30/2020  You are scheduled for a Cardiac Catheterization on Tuesday, May 24 with Dr. Verdis Prime.  1. Please arrive at the [facility] (Main Entrance A) at [facility]: [address] [place], [place] [address] at 8:30 AM (This time is two hours before your procedure to ensure your preparation). Free valet parking service is available.   Special note: Every effort is made to have your procedure done on time. Please understand that emergencies sometimes delay scheduled procedures.  2. Diet: Do not eat solid foods after midnight.  The patient may have clear liquids until 5am upon the day of the procedure.  3. Labs: You will have labs drawn today.  4. Medication instructions in preparation for your procedure:   Contrast Allergy: No  You will need to hold your Furosemide, Potassium, Novolog, Trulicity, and Humalog.  Do not take Diabetes Med Xigduo on  the day of the procedure and HOLD 48 HOURS AFTER THE PROCEDURE.  On the morning of your procedure, take your Aspirin and Plavix/Clopidogrel and any  morning medicines NOT listed above.  You may use sips of water.  5. Plan for one night stay--bring personal belongings. 6. Bring a current list of your medications and current insurance cards. 7. You MUST have a responsible person to drive you home. 8. Someone MUST be with you the first 24 hours after you arrive home or your discharge will be delayed. 9. Please wear clothes that are easy to get on and off and wear slip-on shoes.  Thank you for allowing [place] to care for you!   -- [facility]       Signed, Lesleigh Noe, MD  10/30/2020 10:24 AM    [facility]

## 2020-10-29 NOTE — Progress Notes (Signed)
Cardiology Office Note:    Date:  10/30/2020   ID:  Darlene Rivera, DOB 08-26-1950, MRN [id number]  PCP:  Renford Dills, MD  Cardiologist:  Lesleigh Noe, MD   Referring MD: Renford Dills, MD   No chief complaint on file.   History of Present Illness:    Darlene Rivera is a 70 y.o. female with a hx of  CAD with remote DESincircumflex, recent LAD diagonal bifurcation intervention(07/2015)with provisional stenting of LAD and angioplasty of diagonal with excellent result, hypertension, hyperlipidemia, and diabetes.  Darlene Rivera is experiencing angina several times a week.  It can occur spontaneously but also occurs with physical activity.  On her last visit here several months ago, I asked that she try sublingual nitroglycerin for episodes that occur at rest.  She has noted a definite improvement in in time of resolution and response to sublingual nitroglycerin on multiple occasions.  She has at least 3 episodes per week.  She modulates physical activity to stay under her anginal threshold.  Past Medical History:  Diagnosis Date  . Chest pain   . Diabetes mellitus without complication (HCC)   . GERD (gastroesophageal reflux disease)   . Hyperlipidemia   . Hypertension   . Rheumatoid arthritis(714.0)     Past Surgical History:  Procedure Laterality Date  . CARDIAC CATHETERIZATION N/A 08/11/2015   Procedure: Left Heart Cath and Coronary Angiography;  Surgeon: Lyn Records, MD;  Location: [facility] INVASIVE CV LAB;  Service: Cardiovascular;  Laterality: N/A;  . CARDIAC CATHETERIZATION N/A 08/11/2015   Procedure: Coronary Stent Intervention;  Surgeon: Lyn Records, MD;  Location: [facility] INVASIVE CV LAB;  Service: Cardiovascular;  Laterality: N/A;  . CARPAL TUNNEL RELEASE Right   . CATARACT EXTRACTION Bilateral 2019  . CORONARY ANGIOPLASTY WITH STENT PLACEMENT  2011   DES to the circumflex /notes 08/08/2015  . LEFT HEART CATH AND CORONARY ANGIOGRAPHY N/A 06/01/2018   Procedure: LEFT  HEART CATH AND CORONARY ANGIOGRAPHY;  Surgeon: [place], Peter M, MD;  Location: [facility] INVASIVE CV LAB;  Service: Cardiovascular;  Laterality: N/A;  . NEUROPLASTY / TRANSPOSITION MEDIAN NERVE AT CARPAL TUNNEL Left   . ROUX-EN-Y GASTRIC BYPASS  2002  . TONSILLECTOMY      Current Medications: Current Meds  Medication Sig  . allopurinol (ZYLOPRIM) 300 MG tablet Take 150 mg by mouth daily.   . Calcium Carbonate-Vitamin D (CALCIUM-VITAMIN D) 500-200 MG-UNIT per tablet Take 1 tablet by mouth 2 (two) times daily.   . cetirizine (ZYRTEC) 10 MG tablet Take 10 mg by mouth at bedtime.  . cholecalciferol (VITAMIN D) 1000 UNITS tablet Take 2,000 Units by mouth daily.  . clopidogrel (PLAVIX) 75 MG tablet TAKE 1 TABLET BY MOUTH EVERY DAY  . Cyanocobalamin (VITAMIN B-12 PO) Take 1 tablet by mouth daily.  . ferrous fumarate (HEMOCYTE - 106 MG FE) 325 (106 FE) MG TABS tablet Take 1 tablet by mouth daily before lunch.  . furosemide (LASIX) 40 MG tablet Take 40 mg by mouth daily.  Marland Kitchen ibuprofen (ADVIL,MOTRIN) 400 MG tablet Take 1 tablet (400 mg total) by mouth every 6 (six) hours as needed.  . insulin aspart protamine- aspart (NOVOLOG MIX 70/30) (70-30) 100 UNIT/ML injection Inject 40 Units into the skin daily with breakfast.  . Insulin Lispro (HUMALOG KWIKPEN [place]) Inject 10 Units into the skin at bedtime.  Marland Kitchen ipratropium (ATROVENT) 0.06 % nasal spray Place 2 sprays into both nostrils daily as needed for rhinitis.  Marland Kitchen isosorbide mononitrate (IMDUR) 60 MG  24 hr tablet TAKE 1 TABLET BY MOUTH EVERY DAY  . losartan (COZAAR) 50 MG tablet Take 1 tablet (50 mg total) by mouth daily.  . metoprolol tartrate (LOPRESSOR) 50 MG tablet TAKE 1 TABLET BY MOUTH TWICE A DAY  . mometasone (NASONEX) 50 MCG/ACT nasal spray Place 2 sprays into the nose daily as needed (for allergies).  . Multiple Vitamin (MULTIVITAMIN) capsule Take 1 capsule by mouth daily.  . nitroGLYCERIN (NITROSTAT) 0.4 MG SL tablet Place 1 tablet (0.4 mg total) under  the tongue every 5 (five) minutes as needed for chest pain.  . Omega-3 Fatty Acids (FISH OIL) 1000 MG CAPS Take 2,000 mg by mouth daily.  . potassium chloride (KLOR-CON) 10 MEQ tablet TAKE 1 TABLET BY MOUTH EVERY DAY  . PROAIR RESPICLICK 108 (90 Base) MCG/ACT AEPB Inhale 2 puffs into the lungs every 4 (four) hours as needed. (cough/wheezing)  . rosuvastatin (CRESTOR) 20 MG tablet Take 1 tablet (20 mg total) by mouth daily.  . TRULICITY 1.5 MG/0.5ML SOPN every 7 (seven) days.   Marland Kitchen XIGDUO XR 5-500 MG TB24 Take 1 tablet by mouth 2 (two) times daily.  . [DISCONTINUED] losartan (COZAAR) 25 MG tablet Take 25 mg by mouth daily.  . [DISCONTINUED] simvastatin (ZOCOR) 20 MG tablet TAKE 1 TABLET BY MOUTH EVERY DAY IN THE EVENING     Allergies:   Sulfa antibiotics   Social History   Socioeconomic History  . Marital status: Single    Spouse name: Not on file  . Number of children: Not on file  . Years of education: Not on file  . Highest education level: Not on file  Occupational History  . Not on file  Tobacco Use  . Smoking status: Never Smoker  . Smokeless tobacco: Never Used  Substance and Sexual Activity  . Alcohol use: No    Alcohol/week: 0.0 standard drinks  . Drug use: No  . Sexual activity: Not on file  Other Topics Concern  . Not on file  Social History Narrative  . Not on file   Social Determinants of Health   Financial Resource Strain: Not on file  Food Insecurity: Not on file  Transportation Needs: Not on file  Physical Activity: Not on file  Stress: Not on file  Social Connections: Not on file     Family History: The patient's family history includes Diabetes in her brother and mother; Hypertension in her mother and sister.  ROS:   Please see the history of present illness.    She is gaining weight.  She is not exercising.  Risk factors are not ideally controlled as A1c is 7.6 LDL is 81 and weight is elevated.  All other systems reviewed and are  negative.  EKGs/Labs/Other Studies Reviewed:    The following studies were reviewed today: Recent laboratory data: October 2021  LDL is 81  Total cholesterol is 189  Triglyceride 109 hemoglobin A1c 7.6    EKG:  EKG normal sinus rhythm with normal EKG appearance  Recent Labs: No results found for requested labs within last 8760 hours.  Recent Lipid Panel No results found for: CHOL, TRIG, HDL, CHOLHDL, VLDL, LDLCALC, LDLDIRECT  Physical Exam:    VS:  BP (!) 162/72   Pulse 86   Ht 5\' 2"  (1.575 m)   Wt 207 lb 3.2 oz (94 kg)   SpO2 98%   BMI 37.90 kg/m     Wt Readings from Last 3 Encounters:  10/30/20 207 lb 3.2 oz (94 kg)  09/13/19 207 lb 12.8 oz (94.3 kg)  03/15/19 218 lb 12.8 oz (99.2 kg)     GEN: Morbid obesity. No acute distress HEENT: Normal NECK: No JVD. LYMPHATICS: No lymphadenopathy CARDIAC: No murmur. RRR no gallop, or edema. VASCULAR:  Normal Pulses. No bruits. RESPIRATORY:  Clear to auscultation without rales, wheezing or rhonchi  ABDOMEN: Soft, non-tender, non-distended, No pulsatile mass, MUSCULOSKELETAL: No deformity  SKIN: Warm and dry NEUROLOGIC:  Alert and oriented x 3 PSYCHIATRIC:  Normal affect   ASSESSMENT:    1. Atherosclerosis of native coronary artery of native heart with stable angina pectoris (HCC)   2. Other hyperlipidemia   3. Essential hypertension   4. Bilateral carotid artery stenosis   5. Elevated liver enzymes   6. Angina pectoris (HCC)    PLAN:    In order of problems listed above:  1. Angina pectoris is progressing.  He is limiting/impairing quality of life.  Right knee to 50 mg/day for better blood pressure control.  I have recommended coronary angiography. 2. Increase intensity of statin therapy by switching to rosuvastatin 20 mg/day. 3. Increase losartan to 50 mg/day.  Target 130/80.  Repeat blood pressure today was 150/80 mmHg.  Losartan was recently started by Dr. Nehemiah Settle. 4. Secondary prevention discussed 5. We  will do a lipid panel with liver after change in statin intensity. 6. The patient was counseled to undergo left heart catheterization, coronary angiography, and possible percutaneous coronary intervention with stent implantation. The procedural risks and benefits were discussed in detail. The risks discussed included death, stroke, myocardial infarction, life-threatening bleeding, limb ischemia, kidney injury, allergy, and possible emergency cardiac surgery. The risk of these significant complications were estimated to occur less than 1% of the time. After discussion, the patient has agreed to proceed. 7.    Medication Adjustments/Labs and Tests Ordered: Current medicines are reviewed at length with the patient today.  Concerns regarding medicines are outlined above.  Orders Placed This Encounter  Procedures  . Basic metabolic panel  . CBC  . EKG 12-Lead   Meds ordered this encounter  Medications  . losartan (COZAAR) 50 MG tablet    Sig: Take 1 tablet (50 mg total) by mouth daily.    Dispense:  90 tablet    Refill:  3    Dose change  . rosuvastatin (CRESTOR) 20 MG tablet    Sig: Take 1 tablet (20 mg total) by mouth daily.    Dispense:  90 tablet    Refill:  3    D/c Simvastatin    Patient Instructions  Medication Instructions:  1) DISCONTINUE Simvastatin 2) START Rosuvastatin 20mg  once daily 3) INCREASE Losartan to 50mg  once daily  *If you need a refill on your cardiac medications before your next appointment, please call your pharmacy*   Lab Work: BMET and CBC today  If you have labs (blood work) drawn today and your tests are completely normal, you will receive your results only by: Marland Kitchen MyChart Message (if you have MyChart) OR . A paper copy in the mail If you have any lab test that is abnormal or we need to change your treatment, we will call you to review the results.   Testing/Procedures: Your physician has requested that you have a cardiac catheterization. Cardiac  catheterization is used to diagnose and/or treat various heart conditions. Doctors may recommend this procedure for a number of different reasons. The most common reason is to evaluate chest pain. Chest pain can be a symptom of coronary artery  disease (CAD), and cardiac catheterization can show whether plaque is narrowing or blocking your heart's arteries. This procedure is also used to evaluate the valves, as well as measure the blood flow and oxygen levels in different parts of your heart. For further information please visit [email or web address] Please follow instruction sheet, as given.    Follow-Up: At [facility], you and your health needs are our priority.  As part of our continuing mission to provide you with exceptional heart care, we have created designated Provider Care Teams.  These Care Teams include your primary Cardiologist (physician) and Advanced Practice Providers (APPs -  Physician Assistants and Nurse Practitioners) who all work together to provide you with the care you need, when you need it.  We recommend signing up for the patient portal called "MyChart".  Sign up information is provided on this After Visit Summary.  MyChart is used to connect with patients for Virtual Visits (Telemedicine).  Patients are able to view lab/test results, encounter notes, upcoming appointments, etc.  Non-urgent messages can be sent to your provider as well.   To learn more about what you can do with MyChart, go to [email or web address].    Your next appointment:   2-3 week(s)  The format for your next appointment:   In Person  Provider:   You may see Lesleigh Noe, MD or one of the following Advanced Practice Providers on your designated Care Team:    Georgie Chard, NP    Other Instructions  Due to recent COVID-19 restrictions implemented by our local and state authorities and in an effort to keep both patients and staff as safe as possible, our hospital system requires COVID-19  testing prior to certain scheduled hospital procedures.  Please go to [address] [place], [place] [address] on Friday, May 20th at 8:20am  .  This is a drive up testing site.  You will not need to exit your vehicle. You must agree to self-quarantine from the time of your testing until the procedure date on Tuesday, May 24th.  This should included staying home with ONLY the people you live with.  Avoid take-out, grocery store shopping or leaving the house for any non-emergent reason.  Failure to have your COVID-19 test done on the date and time you have been scheduled will result in cancellation of your procedure.  Please call our office at [phone] if you have any questions.     [facility] CARDIOVASCULAR DIVISION [facility] OFFICE [address] [place], [address] [place] [address] Dept: [phone] Loc: [phone]  Darlene Rivera  10/30/2020  You are scheduled for a Cardiac Catheterization on Tuesday, May 24 with Dr. Verdis Prime.  1. Please arrive at the [facility] (Main Entrance A) at [facility]: [address] [place], [place] [address] at 8:30 AM (This time is two hours before your procedure to ensure your preparation). Free valet parking service is available.   Special note: Every effort is made to have your procedure done on time. Please understand that emergencies sometimes delay scheduled procedures.  2. Diet: Do not eat solid foods after midnight.  The patient may have clear liquids until 5am upon the day of the procedure.  3. Labs: You will have labs drawn today.  4. Medication instructions in preparation for your procedure:   Contrast Allergy: No  You will need to hold your Furosemide, Potassium, Novolog, Trulicity, and Humalog.  Do not take Diabetes Med Xigduo on  the day of the procedure and HOLD 48 HOURS AFTER THE PROCEDURE.  On the morning of your procedure, take your Aspirin and Plavix/Clopidogrel and any  morning medicines NOT listed above.  You may use sips of water.  5. Plan for one night stay--bring personal belongings. 6. Bring a current list of your medications and current insurance cards. 7. You MUST have a responsible person to drive you home. 8. Someone MUST be with you the first 24 hours after you arrive home or your discharge will be delayed. 9. Please wear clothes that are easy to get on and off and wear slip-on shoes.  Thank you for allowing [place] to care for you!   -- [place] [facility]       Signed, Lesleigh Noe, MD  10/30/2020 10:24 AM    [facility]

## 2020-10-30 ENCOUNTER — Ambulatory Visit (INDEPENDENT_AMBULATORY_CARE_PROVIDER_SITE_OTHER): Payer: Managed Care, Other (non HMO) | Admitting: Interventional Cardiology

## 2020-10-30 ENCOUNTER — Other Ambulatory Visit: Payer: Self-pay

## 2020-10-30 ENCOUNTER — Encounter: Payer: Self-pay | Admitting: Interventional Cardiology

## 2020-10-30 VITALS — BP 162/72 | HR 86 | Ht 62.0 in | Wt 207.2 lb

## 2020-10-30 DIAGNOSIS — E7849 Other hyperlipidemia: Secondary | ICD-10-CM

## 2020-10-30 DIAGNOSIS — I25118 Atherosclerotic heart disease of native coronary artery with other forms of angina pectoris: Secondary | ICD-10-CM | POA: Diagnosis not present

## 2020-10-30 DIAGNOSIS — I6523 Occlusion and stenosis of bilateral carotid arteries: Secondary | ICD-10-CM | POA: Diagnosis not present

## 2020-10-30 DIAGNOSIS — I10 Essential (primary) hypertension: Secondary | ICD-10-CM

## 2020-10-30 DIAGNOSIS — R748 Abnormal levels of other serum enzymes: Secondary | ICD-10-CM

## 2020-10-30 DIAGNOSIS — I209 Angina pectoris, unspecified: Secondary | ICD-10-CM

## 2020-10-30 LAB — BASIC METABOLIC PANEL
BUN/Creatinine Ratio: 26 (ref 12–28)
BUN: 22 mg/dL (ref 8–27)
CO2: 28 mmol/L (ref 20–29)
Calcium: 9.6 mg/dL (ref 8.7–10.3)
Chloride: 103 mmol/L (ref 96–106)
Creatinine, Ser: 0.85 mg/dL (ref 0.57–1.00)
Glucose: 222 mg/dL — ABNORMAL HIGH (ref 65–99)
Potassium: 4.7 mmol/L (ref 3.5–5.2)
Sodium: 143 mmol/L (ref 134–144)
eGFR: 74 mL/min/{1.73_m2} (ref >59)

## 2020-10-30 LAB — CBC
Hematocrit: 39.8 % (ref 34.0–46.6)
Hemoglobin: 13 g/dL (ref 11.1–15.9)
MCH: 29 pg (ref 26.6–33.0)
MCHC: 32.7 g/dL (ref 31.5–35.7)
MCV: 89 fL (ref 79–97)
Platelets: 197 10*3/uL (ref 150–450)
RBC: 4.49 x10E6/uL (ref 3.77–5.28)
RDW: 12.8 % (ref 11.7–15.4)
WBC: 9.9 10*3/uL (ref 3.4–10.8)

## 2020-10-30 MED ORDER — ROSUVASTATIN CALCIUM 20 MG PO TABS: 20.0000 mg | ORAL_TABLET | Freq: Every day | ORAL | 3 refills | Status: DC

## 2020-10-30 MED ORDER — LOSARTAN POTASSIUM 50 MG PO TABS: 50.0000 mg | ORAL_TABLET | Freq: Every day | ORAL | 3 refills | Status: DC

## 2020-10-30 NOTE — Patient Instructions (Signed)
Medication Instructions:  1) DISCONTINUE Simvastatin 2) START Rosuvastatin 20mg  once daily 3) INCREASE Losartan to 50mg  once daily  *If you need a refill on your cardiac medications before your next appointment, please call your pharmacy*   Lab Work: BMET and CBC today  If you have labs (blood work) drawn today and your tests are completely normal, you will receive your results only by: MyChart Message (if you have MyChart) OR . A paper copy in the mail If you have any lab test that is abnormal or we need to change your treatment, we will call you to review the results.   Testing/Procedures: Your physician has requested that you have a cardiac catheterization. Cardiac catheterization is used to diagnose and/or treat various heart conditions. Doctors may recommend this procedure for a number of different reasons. The most common reason is to evaluate chest pain. Chest pain can be a symptom of coronary artery disease (CAD), and cardiac catheterization can show whether plaque is narrowing or blocking your heart's arteries. This procedure is also used to evaluate the valves, as well as measure the blood flow and oxygen levels in different parts of your heart. For further information please visit . Please follow instruction sheet, as given.    Follow-Up: At [facility], you and your health needs are our priority.  As part of our continuing mission to provide you with exceptional heart care, we have created designated Provider Care Teams.  These Care Teams include your primary Cardiologist (physician) and Advanced Practice Providers (APPs -  Physician Assistants and Nurse Practitioners) who all work together to provide you with the care you need, when you need it.  We recommend signing up for the patient portal called "MyChart".  Sign up information is provided on this After Visit Summary.  MyChart is used to connect with patients for Virtual Visits (Telemedicine).   Patients are able to view lab/test results, encounter notes, upcoming appointments, etc.  Non-urgent messages can be sent to your provider as well.   To learn more about what you can do with MyChart, go to [email or web address]    Your next appointment:   2-3 week(s)  The format for your next appointment:   In Person  Provider:   You may see [facility], MD or one of the following Advanced Practice Providers on your designated Care Team:    [email or web address], NP    Other Instructions  Due to recent COVID-19 restrictions implemented by our local and state authorities and in an effort to keep both patients and staff as safe as possible, our hospital system requires COVID-19 testing prior to certain scheduled hospital procedures.  Please go to [address] [facility] [place], [facility] on Friday, May 20th at 8:20am  .  This is a drive up testing site.  You will not need to exit your vehicle. You must agree to self-quarantine from the time of your testing until the procedure date on Tuesday, May 24th.  This should included staying home with ONLY the people you live with.  Avoid take-out, grocery store shopping or leaving the house for any non-emergent reason.  Failure to have your COVID-19 test done on the date and time you have been scheduled will result in cancellation of your procedure.  Please call our office at [phone] if you have any questions.     [facility] CARDIOVASCULAR DIVISION [facility] ST OFFICE [address] [place], [address] [place] Dept: [phone]  Loc: [phone]  LIDIA CLAVIJO  10/30/2020  You are scheduled for a Cardiac Catheterization on Tuesday, May 24 with Dr. Verdis Prime.  1. Please arrive at the [facility] (Main Entrance A) at [facility]: [address] [place], [place] [address] at 8:30 AM (This time is two hours before your procedure to ensure your preparation). Free valet parking  service is available.   Special note: Every effort is made to have your procedure done on time. Please understand that emergencies sometimes delay scheduled procedures.  2. Diet: Do not eat solid foods after midnight.  The patient may have clear liquids until 5am upon the day of the procedure.  3. Labs: You will have labs drawn today.  4. Medication instructions in preparation for your procedure:   Contrast Allergy: No  You will need to hold your Furosemide, Potassium, Novolog, Trulicity, and Humalog.  Do not take Diabetes Med Xigduo on the day of the procedure and HOLD 48 HOURS AFTER THE PROCEDURE.  On the morning of your procedure, take your Aspirin and Plavix/Clopidogrel and any morning medicines NOT listed above.  You may use sips of water.  5. Plan for one night stay--bring personal belongings. 6. Bring a current list of your medications and current insurance cards. 7. You MUST have a responsible person to drive you home. 8. Someone MUST be with you the first 24 hours after you arrive home or your discharge will be delayed. 9. Please wear clothes that are easy to get on and off and wear slip-on shoes.  Thank you for allowing [place] to care for you!   -- [place] [facility]

## 2020-11-03 ENCOUNTER — Other Ambulatory Visit (HOSPITAL_COMMUNITY)
Admission: RE | Admit: 2020-11-03 | Discharge: 2020-11-03 | Disposition: A | Discharge status: Home / Self Care | Payer: Managed Care, Other (non HMO) | Source: Ambulatory Visit | Attending: Interventional Cardiology | Admitting: Interventional Cardiology

## 2020-11-03 DIAGNOSIS — Z20822 Contact with and (suspected) exposure to covid-19: Secondary | ICD-10-CM | POA: Diagnosis not present

## 2020-11-03 DIAGNOSIS — Z01812 Encounter for preprocedural laboratory examination: Secondary | ICD-10-CM | POA: Insufficient documentation

## 2020-11-03 LAB — SARS CORONAVIRUS 2 (TAT 6-24 HRS): SARS Coronavirus 2: NEGATIVE

## 2020-11-06 ENCOUNTER — Telehealth: Payer: Self-pay | Admitting: *Deleted

## 2020-11-06 NOTE — Telephone Encounter (Signed)
Pt contacted pre-catheterization scheduled at [facility] for: Tuesday Nov 07, 2020 10:30 AM Verified arrival time and place: [facility] Main Entrance A [facility]) at: 8:30 AM   No solid food after midnight prior to cath, clear liquids until 5 AM day of procedure.  Hold: Insulin-AM of procedure Xigduo-day of procedure and 48 hours post procedure Lasix/KCl-AM of procedure   Except hold medications AM meds can be  taken pre-cath with sips of water including: ASA 81 mg Plavix 75 mg   Confirmed patient has responsible adult to drive home post procedure and be with patient first 24 hours after arriving home: yes  You are allowed ONE visitor in the waiting room during the time you are at the hospital for your procedure. Both you and your visitor must wear a mask once you enter the hospital.   Reviewed procedure/mask/visitor instructions with patient.              Patient reports does not currently have any symptoms concerning for COVID-19 and no household members with COVID-19 like illness.

## 2020-11-07 ENCOUNTER — Encounter (HOSPITAL_COMMUNITY)
Admission: RE | Disposition: A | Discharge status: Home / Self Care | Payer: Self-pay | Source: Home / Self Care | Attending: Interventional Cardiology

## 2020-11-07 ENCOUNTER — Ambulatory Visit (HOSPITAL_COMMUNITY)
Admission: RE | Admit: 2020-11-07 | Discharge: 2020-11-07 | Disposition: A | Discharge status: Home / Self Care | Payer: Managed Care, Other (non HMO) | Attending: Interventional Cardiology | Admitting: Interventional Cardiology

## 2020-11-07 ENCOUNTER — Other Ambulatory Visit: Payer: Self-pay

## 2020-11-07 DIAGNOSIS — E785 Hyperlipidemia, unspecified: Secondary | ICD-10-CM | POA: Diagnosis not present

## 2020-11-07 DIAGNOSIS — Z882 Allergy status to sulfonamides status: Secondary | ICD-10-CM | POA: Diagnosis not present

## 2020-11-07 DIAGNOSIS — I209 Angina pectoris, unspecified: Secondary | ICD-10-CM | POA: Diagnosis present

## 2020-11-07 DIAGNOSIS — R748 Abnormal levels of other serum enzymes: Secondary | ICD-10-CM | POA: Diagnosis not present

## 2020-11-07 DIAGNOSIS — E118 Type 2 diabetes mellitus with unspecified complications: Secondary | ICD-10-CM | POA: Diagnosis present

## 2020-11-07 DIAGNOSIS — E7849 Other hyperlipidemia: Secondary | ICD-10-CM | POA: Insufficient documentation

## 2020-11-07 DIAGNOSIS — I10 Essential (primary) hypertension: Secondary | ICD-10-CM | POA: Diagnosis not present

## 2020-11-07 DIAGNOSIS — Z955 Presence of coronary angioplasty implant and graft: Secondary | ICD-10-CM | POA: Insufficient documentation

## 2020-11-07 DIAGNOSIS — Z794 Long term (current) use of insulin: Secondary | ICD-10-CM | POA: Diagnosis not present

## 2020-11-07 DIAGNOSIS — Z79899 Other long term (current) drug therapy: Secondary | ICD-10-CM | POA: Diagnosis not present

## 2020-11-07 DIAGNOSIS — Z8249 Family history of ischemic heart disease and other diseases of the circulatory system: Secondary | ICD-10-CM | POA: Diagnosis not present

## 2020-11-07 DIAGNOSIS — E119 Type 2 diabetes mellitus without complications: Secondary | ICD-10-CM | POA: Insufficient documentation

## 2020-11-07 DIAGNOSIS — Z833 Family history of diabetes mellitus: Secondary | ICD-10-CM | POA: Diagnosis not present

## 2020-11-07 DIAGNOSIS — I6523 Occlusion and stenosis of bilateral carotid arteries: Secondary | ICD-10-CM | POA: Diagnosis not present

## 2020-11-07 DIAGNOSIS — Z6837 Body mass index (BMI) 37.0-37.9, adult: Secondary | ICD-10-CM | POA: Diagnosis not present

## 2020-11-07 DIAGNOSIS — Z7902 Long term (current) use of antithrombotics/antiplatelets: Secondary | ICD-10-CM | POA: Insufficient documentation

## 2020-11-07 DIAGNOSIS — I25118 Atherosclerotic heart disease of native coronary artery with other forms of angina pectoris: Secondary | ICD-10-CM | POA: Diagnosis not present

## 2020-11-07 DIAGNOSIS — I2510 Atherosclerotic heart disease of native coronary artery without angina pectoris: Secondary | ICD-10-CM | POA: Diagnosis present

## 2020-11-07 DIAGNOSIS — I2584 Coronary atherosclerosis due to calcified coronary lesion: Secondary | ICD-10-CM | POA: Diagnosis not present

## 2020-11-07 HISTORY — PX: LEFT HEART CATH AND CORONARY ANGIOGRAPHY: CATH118249

## 2020-11-07 LAB — GLUCOSE, CAPILLARY
Glucose-Capillary: 156 mg/dL — ABNORMAL HIGH (ref 70–99)
Glucose-Capillary: 125 mg/dL — ABNORMAL HIGH (ref 70–99)

## 2020-11-07 SURGERY — LEFT HEART CATH AND CORONARY ANGIOGRAPHY
Anesthesia: LOCAL

## 2020-11-07 MED ORDER — SODIUM CHLORIDE 0.9% FLUSH: 3.0000 mL | Freq: Two times a day (BID) | INTRAVENOUS | Status: DC

## 2020-11-07 MED ORDER — VERAPAMIL HCL 2.5 MG/ML IV SOLN
INTRAVENOUS | Status: DC | PRN
  Administered 2020-11-07: 10 mL via INTRA_ARTERIAL

## 2020-11-07 MED ORDER — NITROGLYCERIN 1 MG/10 ML FOR IR/CATH LAB
INTRA_ARTERIAL | Status: AC
  Filled 2020-11-07: qty 10

## 2020-11-07 MED ORDER — ASPIRIN 81 MG PO CHEW: 81.0000 mg | CHEWABLE_TABLET | Freq: Every day | ORAL | Status: DC

## 2020-11-07 MED ORDER — SODIUM CHLORIDE 0.9 % IV SOLN: 250.0000 mL | INTRAVENOUS | Status: DC | PRN

## 2020-11-07 MED ORDER — MIDAZOLAM HCL 2 MG/2ML IJ SOLN
INTRAMUSCULAR | Status: AC
  Filled 2020-11-07: qty 2

## 2020-11-07 MED ORDER — NITROGLYCERIN 1 MG/10 ML FOR IR/CATH LAB
INTRA_ARTERIAL | Status: DC | PRN
  Administered 2020-11-07: 200 ug

## 2020-11-07 MED ORDER — CLOPIDOGREL BISULFATE 75 MG PO TABS: 75.0000 mg | ORAL_TABLET | ORAL | Status: DC

## 2020-11-07 MED ORDER — HYDRALAZINE HCL 20 MG/ML IJ SOLN: 10.0000 mg | INTRAMUSCULAR | Status: DC | PRN

## 2020-11-07 MED ORDER — LIDOCAINE HCL (PF) 1 % IJ SOLN
INTRAMUSCULAR | Status: DC | PRN
  Administered 2020-11-07: 2 mL

## 2020-11-07 MED ORDER — HEPARIN SODIUM (PORCINE) 1000 UNIT/ML IJ SOLN
INTRAMUSCULAR | Status: DC | PRN
  Administered 2020-11-07: 4500 [IU] via INTRAVENOUS

## 2020-11-07 MED ORDER — SODIUM CHLORIDE 0.9% FLUSH: 3.0000 mL | INTRAVENOUS | Status: DC | PRN

## 2020-11-07 MED ORDER — FENTANYL CITRATE (PF) 100 MCG/2ML IJ SOLN
INTRAMUSCULAR | Status: AC
  Filled 2020-11-07: qty 2

## 2020-11-07 MED ORDER — HEPARIN (PORCINE) IN NACL 1000-0.9 UT/500ML-% IV SOLN
INTRAVENOUS | Status: AC
  Filled 2020-11-07: qty 1000

## 2020-11-07 MED ORDER — SODIUM CHLORIDE 0.9 % IV SOLN: INTRAVENOUS | Status: DC

## 2020-11-07 MED ORDER — FENTANYL CITRATE (PF) 100 MCG/2ML IJ SOLN
INTRAMUSCULAR | Status: DC | PRN
  Administered 2020-11-07: 50 ug via INTRAVENOUS

## 2020-11-07 MED ORDER — ONDANSETRON HCL 4 MG/2ML IJ SOLN: 4.0000 mg | Freq: Four times a day (QID) | INTRAMUSCULAR | Status: DC | PRN

## 2020-11-07 MED ORDER — HEPARIN SODIUM (PORCINE) 1000 UNIT/ML IJ SOLN
INTRAMUSCULAR | Status: AC
  Filled 2020-11-07: qty 1

## 2020-11-07 MED ORDER — ACETAMINOPHEN 325 MG PO TABS: 650.0000 mg | ORAL_TABLET | ORAL | Status: DC | PRN

## 2020-11-07 MED ORDER — VERAPAMIL HCL 2.5 MG/ML IV SOLN
INTRAVENOUS | Status: AC
  Filled 2020-11-07: qty 2

## 2020-11-07 MED ORDER — ISOSORBIDE MONONITRATE ER 30 MG PO TB24: 30.0000 mg | ORAL_TABLET | Freq: Every day | ORAL | 11 refills | Status: DC

## 2020-11-07 MED ORDER — SODIUM CHLORIDE 0.9 % WEIGHT BASED INFUSION: 1.0000 mL/kg/h | INTRAVENOUS | Status: DC

## 2020-11-07 MED ORDER — LABETALOL HCL 5 MG/ML IV SOLN: 10.0000 mg | INTRAVENOUS | Status: DC | PRN

## 2020-11-07 MED ORDER — ASPIRIN 81 MG PO CHEW: 81.0000 mg | CHEWABLE_TABLET | ORAL | Status: DC

## 2020-11-07 MED ORDER — MIDAZOLAM HCL 2 MG/2ML IJ SOLN
INTRAMUSCULAR | Status: DC | PRN
  Administered 2020-11-07: 1 mg via INTRAVENOUS

## 2020-11-07 MED ORDER — LIDOCAINE HCL (PF) 1 % IJ SOLN
INTRAMUSCULAR | Status: AC
  Filled 2020-11-07: qty 30

## 2020-11-07 MED ORDER — HEPARIN (PORCINE) IN NACL 1000-0.9 UT/500ML-% IV SOLN
INTRAVENOUS | Status: DC | PRN
  Administered 2020-11-07 (×2): 500 mL

## 2020-11-07 MED ORDER — SODIUM CHLORIDE 0.9 % WEIGHT BASED INFUSION
3.0000 mL/kg/h | INTRAVENOUS | Status: AC
  Administered 2020-11-07: 3 mL/kg/h via INTRAVENOUS

## 2020-11-07 MED ORDER — IOHEXOL 350 MG/ML SOLN
INTRAVENOUS | Status: DC | PRN
  Administered 2020-11-07: 60 mL

## 2020-11-07 SURGICAL SUPPLY — 11 items
CATH 5FR JL3.5 JR4 ANG PIG MP (CATHETERS) ×1 IMPLANT
DEVICE RAD COMP TR BAND LRG (VASCULAR PRODUCTS) ×1 IMPLANT
GLIDESHEATH SLEND A-KIT 6F 22G (SHEATH) ×1 IMPLANT
GUIDEWIRE INQWIRE 1.5J.035X260 (WIRE) IMPLANT
INQWIRE 1.5J .035X260CM (WIRE) ×2
KIT HEART LEFT (KITS) ×2 IMPLANT
PACK CARDIAC CATHETERIZATION (CUSTOM PROCEDURE TRAY) ×2 IMPLANT
SHEATH PROBE COVER 6X72 (BAG) ×1 IMPLANT
TRANSDUCER W/STOPCOCK (MISCELLANEOUS) ×2 IMPLANT
TUBING CIL FLEX 10 FLL-RA (TUBING) ×2 IMPLANT
WIRE HI TORQ VERSACORE-J 145CM (WIRE) ×1 IMPLANT

## 2020-11-07 NOTE — CV Procedure (Signed)
   Segmental 50% mid RCA unchanged.  Proximal eccentric 75% RCA, slightly worse than on prior angiogram.  Diffuse PDA disease.  Proximal lesion shows progression compared to 2019.  Short left main without obstruction  Diffuse disease noted and widely patent LAD and circumflex.  Widely patent LAD stent.  Upper normal LVEDP at 16 mmHg.

## 2020-11-07 NOTE — Progress Notes (Signed)
Pt in procedural short stay after a left heart cath.  Heart rate noted to be 102-108.  Pt denies any chest pain or any symptoms. Pt normally takes 50mg  metoprolol BID but has not taken since yesterday.  L. , NP called and assessed pt to be stable. Pt verbalized understanding to take home medications when she gets home. Will continue to monitor.

## 2020-11-07 NOTE — Discharge Instructions (Signed)
Radial Site Care  This sheet gives you information about how to care for yourself after your procedure. Your health care provider may also give you more specific instructions. If you have problems or questions, contact your health care provider. What can I expect after the procedure? After the procedure, it is common to have:  Bruising and tenderness at the catheter insertion area. Follow these instructions at home: Medicines  Take over-the-counter and prescription medicines only as told by your health care provider. Insertion site care  Follow instructions from your health care provider about how to take care of your insertion site. Make sure you: ? Wash your hands with soap and water before you change your bandage (dressing). If soap and water are not available, use hand sanitizer. ? Change your dressing as told by your health care provider. ? Leave stitches (sutures), skin glue, or adhesive strips in place. These skin closures may need to stay in place for 2 weeks or longer. If adhesive strip edges start to loosen and curl up, you may trim the loose edges. Do not remove adhesive strips completely unless your health care provider tells you to do that.  Check your insertion site every day for signs of infection. Check for: ? Redness, swelling, or pain. ? Fluid or blood. ? Pus or a bad smell. ? Warmth.  Do not take baths, swim, or use a hot tub until your health care provider approves.  You may shower 24-48 hours after the procedure, or as directed by your health care provider. ? Remove the dressing and gently wash the site with plain soap and water. ? Pat the area dry with a clean towel. ? Do not rub the site. That could cause bleeding.  Do not apply powder or lotion to the site. Activity  For 24 hours after the procedure, or as directed by your health care provider: ? Do not flex or bend the affected arm. ? Do not push or pull heavy objects with the affected arm. ? Do not drive  yourself home from the hospital or clinic. You may drive 24 hours after the procedure unless your health care provider tells you not to. ? Do not operate machinery or power tools.  Do not lift anything that is heavier than 10 lb (4.5 kg), or the limit that you are told, until your health care provider says that it is safe.  Ask your health care provider when it is okay to: ? Return to work or school. ? Resume usual physical activities or sports. ? Resume sexual activity.   General instructions  If the catheter site starts to bleed, raise your arm and put firm pressure on the site. If the bleeding does not stop, get help right away. This is a medical emergency.  If you went home on the same day as your procedure, a responsible adult should be with you for the first 24 hours after you arrive home.  Keep all follow-up visits as told by your health care provider. This is important. Contact a health care provider if:  You have a fever.  You have redness, swelling, or yellow drainage around your insertion site. Get help right away if:  You have unusual pain at the radial site.  The catheter insertion area swells very fast.  The insertion area is bleeding, and the bleeding does not stop when you hold steady pressure on the area.  Your arm or hand becomes pale, cool, tingly, or numb. These symptoms may represent a serious   problem that is an emergency. Do not wait to see if the symptoms will go away. Get medical help right away. Call your local emergency services (911 in the [place] Do not drive yourself to the hospital. Summary  After the procedure, it is common to have bruising and tenderness at the site.  Follow instructions from your health care provider about how to take care of your radial site wound. Check the wound every day for signs of infection.  Do not lift anything that is heavier than 10 lb (4.5 kg), or the limit that you are told, until your health care provider says that it  is safe. This information is not intended to replace advice given to you by your health care provider. Make sure you discuss any questions you have with your health care provider. Document Revised: 07/09/2017 Document Reviewed: 07/09/2017 Elsevier Patient Education  2021 [employer]  

## 2020-11-07 NOTE — Interval H&P Note (Signed)
Cath Lab Visit (complete for each Cath Lab visit)  Clinical Evaluation Leading to the Procedure:   ACS: No.  Non-ACS:    Anginal Classification: CCS III  Anti-ischemic medical therapy: Maximal Therapy (2 or more classes of medications)  Non-Invasive Test Results: Intermediate-risk stress test findings: cardiac mortality 1-3%/year  Prior CABG: No previous CABG      History and Physical Interval Note:  11/07/2020 11:46 AM  Darlene Rivera  has presented today for surgery, with the diagnosis of angina.  The various methods of treatment have been discussed with the patient and family. After consideration of risks, benefits and other options for treatment, the patient has consented to  Procedure(s): LEFT HEART CATH AND CORONARY ANGIOGRAPHY (N/A) as a surgical intervention.  The patient's history has been reviewed, patient examined, no change in status, stable for surgery.  I have reviewed the patient's chart and labs.  Questions were answered to the patient's satisfaction.     Lyn Records III

## 2020-11-08 ENCOUNTER — Other Ambulatory Visit: Payer: Self-pay | Admitting: *Deleted

## 2020-11-08 ENCOUNTER — Encounter (HOSPITAL_COMMUNITY): Payer: Self-pay | Admitting: Interventional Cardiology

## 2020-11-08 MED ORDER — ISOSORBIDE MONONITRATE ER 120 MG PO TB24: 120.0000 mg | ORAL_TABLET | Freq: Every day | ORAL | 3 refills | Status: DC

## 2020-11-22 ENCOUNTER — Ambulatory Visit: Payer: Managed Care, Other (non HMO) | Admitting: Interventional Cardiology

## 2020-12-11 ENCOUNTER — Other Ambulatory Visit: Payer: Self-pay | Admitting: *Deleted

## 2020-12-11 MED ORDER — POTASSIUM CHLORIDE ER 10 MEQ PO TBCR
10.0000 meq | EXTENDED_RELEASE_TABLET | Freq: Every day | ORAL | 2 refills | Status: DC
Start: 2020-12-11 — End: 2021-09-20

## 2020-12-17 NOTE — Progress Notes (Signed)
Cardiology Office Note   Date:  12/20/2020   ID:  Darlene Rivera, DOB 1951-01-24, MRN [id number]  PCP:  Renford Dills, MD  Cardiologist:  Dr. Katrinka Blazing, MD  Chief Complaint  Patient presents with   Follow-up     History of Present Illness: Darlene Rivera is a 70 y.o. female who presents for post cath follow up, seen for Dr. Katrinka Blazing.   Darlene Rivera has a hx of CAD with remote PCI/DES to LCX and recent LAD bifurcation intervention in 07/2015, also with angioplasty of the diagonal, HTN, HLD, and DM2.   She was seen by Dr. Katrinka Blazing 10/30/20 at which time she reported experiencing angina several times per week. Episodes were with resting but also with exertion. She was previously told to try SL NTG to differentiate the pain>>>with improvement. Plan was for follow up cath which was performed on 11/07/20 which showed progression of RCA disease from 50% to 70%. There was diffuse segmental 50% stenosis in the mid right coronary unchanged. Widely patent LAD including the mid vessel stent.  The jailed diagonal contains ostial 85% narrowing. Patent stent in the proximal circumflex with generalized luminal irregularities noted. Plan was to continue ASA and Plavix. She was started in Imdur 30mg  QD. Per cath note, if angina interferes with quality of life, could consider stenting entire proximal to distal RCA.   Today she comes in and reports no real change in her symptoms since increase in her Imdur. She continues to have atypical chest pain symptoms occurring 2-3 times per week. Symptoms are described as a burning in her chest with no associated symptoms. Per cath report, plan is to follow symptoms closely and if symptoms worsen despite therapies, then could consider PCI. I have offered Ranexa and amlodipine. She does not wish to try these yet. She wishes to contact us if she changes her mind, if symptoms worsen.   Past Medical History:  Diagnosis Date   Chest pain    Diabetes mellitus without  complication (HCC)    GERD (gastroesophageal reflux disease)    Hyperlipidemia    Hypertension    Rheumatoid arthritis(714.0)     Past Surgical History:  Procedure Laterality Date   CARDIAC CATHETERIZATION N/A 08/11/2015   Procedure: Left Heart Cath and Coronary Angiography;  Surgeon: Lyn Records, MD;  Location: [facility] INVASIVE CV LAB;  Service: Cardiovascular;  Laterality: N/A;   CARDIAC CATHETERIZATION N/A 08/11/2015   Procedure: Coronary Stent Intervention;  Surgeon: Lyn Records, MD;  Location: [facility] INVASIVE CV LAB;  Service: Cardiovascular;  Laterality: N/A;   CARPAL TUNNEL RELEASE Right    CATARACT EXTRACTION Bilateral 2019   CORONARY ANGIOPLASTY WITH STENT PLACEMENT  2011   DES to the circumflex /notes 08/08/2015   LEFT HEART CATH AND CORONARY ANGIOGRAPHY N/A 06/01/2018   Procedure: LEFT HEART CATH AND CORONARY ANGIOGRAPHY;  Surgeon: [place], Peter M, MD;  Location: [facility] INVASIVE CV LAB;  Service: Cardiovascular;  Laterality: N/A;   LEFT HEART CATH AND CORONARY ANGIOGRAPHY N/A 11/07/2020   Procedure: LEFT HEART CATH AND CORONARY ANGIOGRAPHY;  Surgeon: Lyn Records, MD;  Location: [facility] INVASIVE CV LAB;  Service: Cardiovascular;  Laterality: N/A;   NEUROPLASTY / TRANSPOSITION MEDIAN NERVE AT CARPAL TUNNEL Left    ROUX-EN-Y GASTRIC BYPASS  2002   TONSILLECTOMY       Current Outpatient Medications  Medication Sig Dispense Refill   allopurinol (ZYLOPRIM) 300 MG tablet Take 150 mg by mouth daily.   3   b  complex vitamins capsule Take 1 capsule by mouth daily.     Calcium Carbonate-Vitamin D (CALCIUM-VITAMIN D) 500-200 MG-UNIT per tablet Take 1 tablet by mouth 2 (two) times daily.      cholecalciferol (VITAMIN D) 1000 UNITS tablet Take 1,000 Units by mouth daily.     clopidogrel (PLAVIX) 75 MG tablet TAKE 1 TABLET BY MOUTH EVERY DAY 90 tablet 3   Continuous Blood Gluc Sensor (FREESTYLE LIBRE 2 SENSOR) MISC Inject into the skin.     Cyanocobalamin (VITAMIN B-12 PO) Take 1 tablet by mouth  daily.     ferrous fumarate (HEMOCYTE - 106 MG FE) 325 (106 FE) MG TABS tablet Take 1 tablet by mouth daily before lunch.     furosemide (LASIX) 40 MG tablet Take 40 mg by mouth daily.     insulin aspart protamine- aspart (NOVOLOG MIX 70/30) (70-30) 100 UNIT/ML injection Inject 30 Units into the skin daily with breakfast.     insulin lispro (HUMALOG) 100 UNIT/ML KwikPen Inject 3-5 Units into the skin 2 (two) times daily.     isosorbide mononitrate (IMDUR) 120 MG 24 hr tablet Take 1 tablet (120 mg total) by mouth daily. 90 tablet 3   loratadine (CLARITIN) 10 MG tablet Take 10 mg by mouth daily.     losartan (COZAAR) 50 MG tablet Take 1 tablet (50 mg total) by mouth daily. 90 tablet 3   metoprolol tartrate (LOPRESSOR) 50 MG tablet TAKE 1 TABLET BY MOUTH TWICE A DAY 180 tablet 3   Multiple Vitamin (MULTIVITAMIN) capsule Take 1 capsule by mouth daily.     nitroGLYCERIN (NITROSTAT) 0.4 MG SL tablet Place 1 tablet (0.4 mg total) under the tongue every 5 (five) minutes as needed for chest pain. 25 tablet 7   Omega-3 Fatty Acids (FISH OIL) 1000 MG CAPS Take 1,000 mg by mouth daily.     potassium chloride (KLOR-CON) 10 MEQ tablet Take 1 tablet (10 mEq total) by mouth daily. 90 tablet 2   rosuvastatin (CRESTOR) 20 MG tablet Take 1 tablet (20 mg total) by mouth daily. 90 tablet 3   TRULICITY 1.5 MG/0.5ML SOPN Inject 1.5 mg into the skin every Saturday.     vitamin C (ASCORBIC ACID) 500 MG tablet Take 500 mg by mouth daily.     XIGDUO XR 5-500 MG TB24 Take 1 tablet by mouth 2 (two) times daily.     No current facility-administered medications for this visit.    Allergies:   Sulfa antibiotics    Social History:  The patient  reports that she has never smoked. She has never used smokeless tobacco. She reports that she does not drink alcohol and does not use drugs.   Family History:  The patient's family history includes Diabetes in her brother and mother; Hypertension in her mother and sister.    ROS:   Please see the history of present illness. Otherwise, review of systems are positive for none.   All other systems are reviewed and negative.    PHYSICAL EXAM: VS:  BP 136/72   Pulse 84   Ht 5\' 2"  (1.575 m)   Wt 211 lb (95.7 kg)   SpO2 98%   BMI 38.59 kg/m  , BMI Body mass index is 38.59 kg/m.  General: Well developed, well nourished, NAD Neck: Negative for carotid bruits. No JVD Lungs:Clear to ausculation bilaterally. Breathing is unlabored. Cardiovascular: RRR with S1 S2. No murmurs Extremities: No edema.  Neuro: Alert and oriented. No focal deficits. No facial asymmetry. MAE  spontaneously. Psych: Responds to questions appropriately with normal affect.     EKG:  EKG is not ordered today.   Recent Labs: 10/30/2020: BUN 22; Creatinine, Ser 0.85; Hemoglobin 13.0; Platelets 197; Potassium 4.7; Sodium 143    Lipid Panel No results found for: CHOL, TRIG, HDL, CHOLHDL, VLDL, LDLCALC, LDLDIRECT    Wt Readings from Last 3 Encounters:  12/20/20 211 lb (95.7 kg)  11/07/20 207 lb (93.9 kg)  10/30/20 207 lb 3.2 oz (94 kg)     Other studies Reviewed: Additional studies/ records that were reviewed today include: . Review of the above records demonstrates:   [facility] 11/07/20:  There is progression of disease in the proximal RCA from 50% to 70%.  There is diffuse segmental 50% stenosis in the mid right coronary unchanged. Short left main Widely patent LAD including the mid vessel stent.  The jailed diagonal contains ostial 85% narrowing. Patent stent in the proximal circumflex with generalized luminal irregularities noted. Normal LVEDP.   RECOMMENDATIONS:   Continue aspirin and Plavix. Progress anti-ischemic therapy by adding long-acting nitrates, isosorbide mononitrate 30 mg/day. Aggressive risk factor modification. If angina interferes with quality of life, consider stenting the entire proximal to distal RCA segment   Diagnostic Dominance: Right        ASSESSMENT AND  PLAN:  1. CAD: -Pt with known CAD who was recently seen by Dr. Katrinka Blazing with progressive angina>>LHC with /24/22 which showed progression of RCA disease from 50% to 70%. There was diffuse segmental 50% stenosis in the mid right coronary unchanged. Widely patent LAD including the mid vessel stent.  The jailed diagonal contains ostial 85% narrowing. Patent stent in the proximal circumflex with generalized luminal irregularities noted.  -Plan was to continue ASA and Plavix. Imdur was up-titrated to 120mg  QD -Continues to have "tolerable" chest pain several times per week.  -Offered to add Ranexa or Amlodipine however she has deferred for now. She will continue to monitor her symptoms and inform us of any changes. We will also keep close follow up with her as well.   2. HLD: -Last LDL, 81 from 03/2020 -Goal LDL <70 -Continue Crestor   3. HTN: -Stable, 136/72 -No change in current regimen   4. Bilateral caortid artery stenosis: -Last duplex study -Continue ASA and statin   5. DM2: -HbA1c, 7.6 -Follows closely with endocrinology   Current medicines are reviewed at length with the patient today.  The patient does not have concerns regarding medicines.  The following changes have been made:  no change  Labs/ tests ordered today include: None  No orders of the defined types were placed in this encounter.    Disposition:   FU with Dr. Katrinka Blazing or APP in 3 months  Signed, Georgie Chard, NP  12/20/2020 2:31 PM    [facility] [address] [place], [place], [place]  [address] Phone: [phone]; Fax: [phone]

## 2020-12-20 ENCOUNTER — Encounter: Payer: Self-pay | Admitting: Cardiology

## 2020-12-20 ENCOUNTER — Other Ambulatory Visit: Payer: Self-pay

## 2020-12-20 ENCOUNTER — Ambulatory Visit (INDEPENDENT_AMBULATORY_CARE_PROVIDER_SITE_OTHER): Payer: Managed Care, Other (non HMO) | Admitting: Cardiology

## 2020-12-20 VITALS — BP 136/72 | HR 84 | Ht 62.0 in | Wt 211.0 lb

## 2020-12-20 DIAGNOSIS — I6523 Occlusion and stenosis of bilateral carotid arteries: Secondary | ICD-10-CM

## 2020-12-20 DIAGNOSIS — E118 Type 2 diabetes mellitus with unspecified complications: Secondary | ICD-10-CM | POA: Diagnosis not present

## 2020-12-20 DIAGNOSIS — I25118 Atherosclerotic heart disease of native coronary artery with other forms of angina pectoris: Secondary | ICD-10-CM | POA: Diagnosis not present

## 2020-12-20 DIAGNOSIS — I10 Essential (primary) hypertension: Secondary | ICD-10-CM

## 2020-12-20 DIAGNOSIS — I779 Disorder of arteries and arterioles, unspecified: Secondary | ICD-10-CM

## 2020-12-20 NOTE — Patient Instructions (Addendum)
Medication Instructions:  Your physician recommends that you continue on your current medications as directed. Please refer to the Current Medication list given to you today.  *If you need a refill on your cardiac medications before your next appointment, please call your pharmacy*   Lab Work: None ordered  If you have labs (blood work) drawn today and your tests are completely normal, you will receive your results only by: MyChart Message (if you have MyChart) OR A paper copy in the mail If you have any lab test that is abnormal or we need to change your treatment, we will call you to review the results.   Testing/Procedures: None ordered   Follow-Up: At [facility], you and your health needs are our priority.  As part of our continuing mission to provide you with exceptional heart care, we have created designated Provider Care Teams.  These Care Teams include your primary Cardiologist (physician) and Advanced Practice Providers (APPs -  Physician Assistants and Nurse Practitioners) who all work together to provide you with the care you need, when you need it.  We recommend signing up for the patient portal called "MyChart".  Sign up information is provided on this After Visit Summary.  MyChart is used to connect with patients for Virtual Visits (Telemedicine).  Patients are able to view lab/test results, encounter notes, upcoming appointments, etc.  Non-urgent messages can be sent to your provider as well.   To learn more about what you can do with MyChart, go to [email or web address].    Your next appointment:   2-3 month(s)  The format for your next appointment:   In Person  Provider:   You may see Lesleigh Noe, MD or one of the following Advanced Practice Providers on your designated Care Team:   Georgie Chard, NP   Other Instructions

## 2021-02-22 NOTE — Progress Notes (Signed)
Cardiology Office Note:    Date:  02/23/2021   ID:  Darlene Rivera, DOB 15-Oct-1950, MRN [id number]  PCP:  Renford Dills, MD  Cardiologist:  Lesleigh Noe, MD   Referring MD: Renford Dills, MD   Chief Complaint  Patient presents with   Coronary Artery Disease   Hypertension    History of Present Illness:    Darlene Rivera is a 70 y.o. female with a hx of  CAD with remote DES in circumflex, recent LAD diagonal bifurcation intervention (07/2015) with provisional stenting of LAD and angioplasty of diagonal with excellent result, hypertension, hyperlipidemia, and Diabetes Mellitus type II.   Devinn is doing relatively well.  She has having some limiting calf discomfort with walking.  She also gets some tightness in her chest if she walks too fast or too far.  Stopping and resting helps both to resolve.  She also notes that if she does not use long-acting nitrates, she does not feel good.  She has more tightness in her chest and dyspnea.  She is compliant with medication regimen.  She feels her quality of life is good.  She is able to shop and do house chores without difficulty.  She does not feel there is any need to give her more medication for angina suppression or to consider recath with stenting.  Past Medical History:  Diagnosis Date   Chest pain    Diabetes mellitus without complication (HCC)    GERD (gastroesophageal reflux disease)    Hyperlipidemia    Hypertension    Rheumatoid arthritis(714.0)     Past Surgical History:  Procedure Laterality Date   CARDIAC CATHETERIZATION N/A 08/11/2015   Procedure: Left Heart Cath and Coronary Angiography;  Surgeon: Lyn Records, MD;  Location: [facility] INVASIVE CV LAB;  Service: Cardiovascular;  Laterality: N/A;   CARDIAC CATHETERIZATION N/A 08/11/2015   Procedure: Coronary Stent Intervention;  Surgeon: Lyn Records, MD;  Location: [facility] INVASIVE CV LAB;  Service: Cardiovascular;  Laterality: N/A;   CARPAL TUNNEL RELEASE Right     CATARACT EXTRACTION Bilateral 2019   CORONARY ANGIOPLASTY WITH STENT PLACEMENT  2011   DES to the circumflex /notes 08/08/2015   LEFT HEART CATH AND CORONARY ANGIOGRAPHY N/A 06/01/2018   Procedure: LEFT HEART CATH AND CORONARY ANGIOGRAPHY;  Surgeon: [place], Peter M, MD;  Location: [facility] INVASIVE CV LAB;  Service: Cardiovascular;  Laterality: N/A;   LEFT HEART CATH AND CORONARY ANGIOGRAPHY N/A 11/07/2020   Procedure: LEFT HEART CATH AND CORONARY ANGIOGRAPHY;  Surgeon: Lyn Records, MD;  Location: [facility] INVASIVE CV LAB;  Service: Cardiovascular;  Laterality: N/A;   NEUROPLASTY / TRANSPOSITION MEDIAN NERVE AT CARPAL TUNNEL Left    ROUX-EN-Y GASTRIC BYPASS  2002   TONSILLECTOMY      Current Medications: Current Meds  Medication Sig   allopurinol (ZYLOPRIM) 300 MG tablet Take 150 mg by mouth daily.    b complex vitamins capsule Take 1 capsule by mouth daily.   Calcium Carbonate-Vitamin D (CALCIUM-VITAMIN D) 500-200 MG-UNIT per tablet Take 1 tablet by mouth 2 (two) times daily.    cholecalciferol (VITAMIN D) 1000 UNITS tablet Take 1,000 Units by mouth daily.   clopidogrel (PLAVIX) 75 MG tablet TAKE 1 TABLET BY MOUTH EVERY DAY   Cyanocobalamin (VITAMIN B-12 PO) Take 1 tablet by mouth daily.   ferrous fumarate (HEMOCYTE - 106 MG FE) 325 (106 FE) MG TABS tablet Take 1 tablet by mouth daily before lunch.   furosemide (LASIX) 40 MG tablet  Take 40 mg by mouth daily.   insulin aspart protamine- aspart (NOVOLOG MIX 70/30) (70-30) 100 UNIT/ML injection Inject 30 Units into the skin daily with breakfast.   insulin lispro (HUMALOG) 100 UNIT/ML KwikPen Inject 3-5 Units into the skin 2 (two) times daily.   isosorbide mononitrate (IMDUR) 120 MG 24 hr tablet Take 1 tablet (120 mg total) by mouth daily.   loratadine (CLARITIN) 10 MG tablet Take 10 mg by mouth daily.   losartan (COZAAR) 50 MG tablet Take 1 tablet (50 mg total) by mouth daily.   metoprolol tartrate (LOPRESSOR) 50 MG tablet TAKE 1 TABLET BY MOUTH TWICE  A DAY   Multiple Vitamin (MULTIVITAMIN) capsule Take 1 capsule by mouth daily.   Omega-3 Fatty Acids (FISH OIL) 1000 MG CAPS Take 1,000 mg by mouth daily.   potassium chloride (KLOR-CON) 10 MEQ tablet Take 1 tablet (10 mEq total) by mouth daily.   rosuvastatin (CRESTOR) 20 MG tablet Take 1 tablet (20 mg total) by mouth daily.   vitamin C (ASCORBIC ACID) 500 MG tablet Take 500 mg by mouth daily.   XIGDUO XR 5-500 MG TB24 Take 1 tablet by mouth 2 (two) times daily.     Allergies:   Sulfa antibiotics   Social History   Socioeconomic History   Marital status: Single    Spouse name: Not on file   Number of children: Not on file   Years of education: Not on file   Highest education level: Not on file  Occupational History   Not on file  Tobacco Use   Smoking status: Never   Smokeless tobacco: Never  Substance and Sexual Activity   Alcohol use: No    Alcohol/week: 0.0 standard drinks   Drug use: No   Sexual activity: Not on file  Other Topics Concern   Not on file  Social History Narrative   Not on file   Social Determinants of Health   Financial Resource Strain: Not on file  Food Insecurity: Not on file  Transportation Needs: Not on file  Physical Activity: Not on file  Stress: Not on file  Social Connections: Not on file     Family History: The patient's family history includes Diabetes in her brother and mother; Hypertension in her mother and sister.  ROS:   Please see the history of present illness.    Difficulty with pain in her legs.  She also has numbness and tingling in her arms and hands.  She is having her medications adjusted by Dr. Cleon Gustin.  All other systems reviewed and are negative.  EKGs/Labs/Other Studies Reviewed:    The following studies were reviewed today: Cardiac catheterization 10/2020: Diagnostic Dominance: Right Intervention   EKG:  EKG not repeated today.  Recent Labs: 10/30/2020: BUN 22; Creatinine, Ser 0.85; Hemoglobin 13.0; Platelets  197; Potassium 4.7; Sodium 143  Recent Lipid Panel No results found for: CHOL, TRIG, HDL, CHOLHDL, VLDL, LDLCALC, LDLDIRECT  Physical Exam:    VS:  BP 122/62   Pulse 88   Ht 5\' 2"  (1.575 m)   Wt 233 lb 9.6 oz (106 kg)   SpO2 98%   BMI 42.73 kg/m     Wt Readings from Last 3 Encounters:  02/23/21 233 lb 9.6 oz (106 kg)  12/20/20 211 lb (95.7 kg)  11/07/20 207 lb (93.9 kg)     GEN: Obese and gaining weight.. No acute distress HEENT: Normal NECK: No JVD. LYMPHATICS: No lymphadenopathy CARDIAC: No murmur. RRR S4 gallop but no S3  gallop, or edema. VASCULAR:  Normal Pulses. No bruits. RESPIRATORY:  Clear to auscultation without rales, wheezing or rhonchi  ABDOMEN: Soft, non-tender, non-distended, No pulsatile mass, MUSCULOSKELETAL: No deformity  SKIN: Warm and dry NEUROLOGIC:  Alert and oriented x 3 PSYCHIATRIC:  Normal affect   ASSESSMENT:    1. Coronary artery disease of native artery of native heart with stable angina pectoris (HCC)   2. Essential hypertension   3. Other hyperlipidemia   4. Diabetes mellitus with complication (HCC)   5. Bilateral carotid artery disease, unspecified type (HCC)   6. Claudication (HCC)    PLAN:    In order of problems listed above:  Continue current anti-ischemic regimen.  The patient is stable and happy with her current quality of life.  Use nitroglycerin more liberally for episodes of chest discomfort. Blood pressure control is excellent.  Continue all therapies listed including Cozaar, Lopressor, and salt restriction. Continue Crestor 20 mg/day. She is on dapagliflozin and a combo pill with metformin with the dapagliflozin total dose per day being 10 mg. No neurological symptoms. Bilateral lower extremity arterial Doppler study to assess severity of peripheral artery disease and claudication correlation.   Overall education and awareness concerning secondary risk prevention was discussed in detail: LDL less than 70, hemoglobin A1c  less than 7, blood pressure target less than 130/80 mmHg, >150 minutes of moderate aerobic activity per week, avoidance of smoking, weight control (via diet and exercise), and continued surveillance/management of/for obstructive sleep apnea.   Medication Adjustments/Labs and Tests Ordered: Current medicines are reviewed at length with the patient today.  Concerns regarding medicines are outlined above.  Orders Placed This Encounter  Procedures   VAS [place] LOWER EXTREMITY ARTERIAL DUPLEX    No orders of the defined types were placed in this encounter.   Patient Instructions  Medication Instructions:  Your physician recommends that you continue on your current medications as directed. Please refer to the Current Medication list given to you today.  *If you need a refill on your cardiac medications before your next appointment, please call your pharmacy*   Lab Work: None If you have labs (blood work) drawn today and your tests are completely normal, you will receive your results only by: MyChart Message (if you have MyChart) OR A paper copy in the mail If you have any lab test that is abnormal or we need to change your treatment, we will call you to review the results.   Testing/Procedures: Your physician has requested that you have a lower or upper extremity arterial duplex. This test is an ultrasound of the arteries in the legs or arms. It looks at arterial blood flow in the legs and arms. Allow one hour for Lower and Upper Arterial scans. There are no restrictions or special instructions   Follow-Up: At [facility], you and your health needs are our priority.  As part of our continuing mission to provide you with exceptional heart care, we have created designated Provider Care Teams.  These Care Teams include your primary Cardiologist (physician) and Advanced Practice Providers (APPs -  Physician Assistants and Nurse Practitioners) who all work together to provide you with the care  you need, when you need it.  We recommend signing up for the patient portal called "MyChart".  Sign up information is provided on this After Visit Summary.  MyChart is used to connect with patients for Virtual Visits (Telemedicine).  Patients are able to view lab/test results, encounter notes, upcoming appointments, etc.  Non-urgent messages  can be sent to your provider as well.   To learn more about what you can do with MyChart, go to [email or web address].    Your next appointment:   6 month(s)  The format for your next appointment:   In Person  Provider:   You may see Lesleigh Noe, MD or one of the following Advanced Practice Providers on your designated Care Team:   Nada Boozer, NP   Other Instructions  Your provider recommends that you maintain 150 minutes per week of moderate aerobic activity.     Signed, Lesleigh Noe, MD  02/23/2021 4:28 PM    [facility]

## 2021-02-23 ENCOUNTER — Ambulatory Visit (INDEPENDENT_AMBULATORY_CARE_PROVIDER_SITE_OTHER): Payer: Managed Care, Other (non HMO) | Admitting: Interventional Cardiology

## 2021-02-23 ENCOUNTER — Encounter: Payer: Self-pay | Admitting: Interventional Cardiology

## 2021-02-23 ENCOUNTER — Other Ambulatory Visit: Payer: Self-pay

## 2021-02-23 VITALS — BP 122/62 | HR 88 | Ht 62.0 in | Wt 233.6 lb

## 2021-02-23 DIAGNOSIS — E7849 Other hyperlipidemia: Secondary | ICD-10-CM

## 2021-02-23 DIAGNOSIS — I6523 Occlusion and stenosis of bilateral carotid arteries: Secondary | ICD-10-CM

## 2021-02-23 DIAGNOSIS — E118 Type 2 diabetes mellitus with unspecified complications: Secondary | ICD-10-CM

## 2021-02-23 DIAGNOSIS — I10 Essential (primary) hypertension: Secondary | ICD-10-CM | POA: Diagnosis not present

## 2021-02-23 DIAGNOSIS — I739 Peripheral vascular disease, unspecified: Secondary | ICD-10-CM

## 2021-02-23 DIAGNOSIS — I25118 Atherosclerotic heart disease of native coronary artery with other forms of angina pectoris: Secondary | ICD-10-CM

## 2021-02-23 DIAGNOSIS — I779 Disorder of arteries and arterioles, unspecified: Secondary | ICD-10-CM

## 2021-02-23 NOTE — Patient Instructions (Signed)
Medication Instructions:  Your physician recommends that you continue on your current medications as directed. Please refer to the Current Medication list given to you today.  *If you need a refill on your cardiac medications before your next appointment, please call your pharmacy*   Lab Work: None If you have labs (blood work) drawn today and your tests are completely normal, you will receive your results only by: MyChart Message (if you have MyChart) OR A paper copy in the mail If you have any lab test that is abnormal or we need to change your treatment, we will call you to review the results.   Testing/Procedures: Your physician has requested that you have a lower or upper extremity arterial duplex. This test is an ultrasound of the arteries in the legs or arms. It looks at arterial blood flow in the legs and arms. Allow one hour for Lower and Upper Arterial scans. There are no restrictions or special instructions   Follow-Up: At [facility], you and your health needs are our priority.  As part of our continuing mission to provide you with exceptional heart care, we have created designated Provider Care Teams.  These Care Teams include your primary Cardiologist (physician) and Advanced Practice Providers (APPs -  Physician Assistants and Nurse Practitioners) who all work together to provide you with the care you need, when you need it.  We recommend signing up for the patient portal called "MyChart".  Sign up information is provided on this After Visit Summary.  MyChart is used to connect with patients for Virtual Visits (Telemedicine).  Patients are able to view lab/test results, encounter notes, upcoming appointments, etc.  Non-urgent messages can be sent to your provider as well.   To learn more about what you can do with MyChart, go to [email or web address].    Your next appointment:   6 month(s)  The format for your next appointment:   In Person  Provider:   You may see  Lesleigh Noe, MD or one of the following Advanced Practice Providers on your designated Care Team:   Nada Boozer, NP   Other Instructions  Your provider recommends that you maintain 150 minutes per week of moderate aerobic activity.

## 2021-03-12 ENCOUNTER — Other Ambulatory Visit: Payer: Self-pay | Admitting: Interventional Cardiology

## 2021-03-12 DIAGNOSIS — I739 Peripheral vascular disease, unspecified: Secondary | ICD-10-CM

## 2021-03-15 ENCOUNTER — Ambulatory Visit (HOSPITAL_COMMUNITY)
Admission: RE | Admit: 2021-03-15 | Discharge: 2021-03-15 | Disposition: A | Discharge status: Home / Self Care | Payer: Managed Care, Other (non HMO) | Source: Ambulatory Visit | Attending: Cardiology | Admitting: Cardiology

## 2021-03-15 ENCOUNTER — Other Ambulatory Visit: Payer: Self-pay

## 2021-03-15 DIAGNOSIS — I739 Peripheral vascular disease, unspecified: Secondary | ICD-10-CM | POA: Diagnosis present

## 2021-05-28 ENCOUNTER — Ambulatory Visit (INDEPENDENT_AMBULATORY_CARE_PROVIDER_SITE_OTHER): Payer: Managed Care, Other (non HMO) | Admitting: Ophthalmology

## 2021-05-28 ENCOUNTER — Other Ambulatory Visit: Payer: Self-pay

## 2021-05-28 ENCOUNTER — Encounter (INDEPENDENT_AMBULATORY_CARE_PROVIDER_SITE_OTHER): Payer: Self-pay | Admitting: Ophthalmology

## 2021-05-28 DIAGNOSIS — H4312 Vitreous hemorrhage, left eye: Secondary | ICD-10-CM | POA: Diagnosis not present

## 2021-05-28 DIAGNOSIS — H348322 Tributary (branch) retinal vein occlusion, left eye, stable: Secondary | ICD-10-CM

## 2021-05-28 DIAGNOSIS — H33312 Horseshoe tear of retina without detachment, left eye: Secondary | ICD-10-CM

## 2021-05-28 DIAGNOSIS — H43811 Vitreous degeneration, right eye: Secondary | ICD-10-CM | POA: Insufficient documentation

## 2021-05-28 DIAGNOSIS — E118 Type 2 diabetes mellitus with unspecified complications: Secondary | ICD-10-CM

## 2021-05-28 DIAGNOSIS — Z9889 Other specified postprocedural states: Secondary | ICD-10-CM | POA: Insufficient documentation

## 2021-05-28 DIAGNOSIS — H348321 Tributary (branch) retinal vein occlusion, left eye, with retinal neovascularization: Secondary | ICD-10-CM

## 2021-05-28 DIAGNOSIS — I6523 Occlusion and stenosis of bilateral carotid arteries: Secondary | ICD-10-CM

## 2021-05-28 NOTE — Assessment & Plan Note (Signed)
Stable OS observe 

## 2021-05-28 NOTE — Assessment & Plan Note (Signed)
No holes or tears 

## 2021-05-28 NOTE — Assessment & Plan Note (Signed)
No sign of detectable DR

## 2021-05-28 NOTE — Assessment & Plan Note (Signed)
Condition cleared from vitrectomy September 2021

## 2021-05-28 NOTE — Progress Notes (Signed)
05/28/2021     CHIEF COMPLAINT Patient presents for  Chief Complaint  Patient presents with   Retina Follow Up      HISTORY OF PRESENT ILLNESS: Darlene Rivera is a 70 y.o. female who presents to the clinic today for:   HPI     Retina Follow Up           Diagnosis: CRVO/BRVO   Laterality: left eye   Onset: 1 year ago   Severity: mild   Duration: 1 year   Course: stable         Comments   1 year fu OU and fp  History of extra macular BRVO, OS, status post vitrectomy PRP sector fashion inferotemporal OS September 2021, no new symptoms over the last 1 year Pt states [facility] OU stable since last visit. Pt denies FOL, floaters, or ocular pain OU.  A1C: 7..0 LBS: 136      Last edited by Edmon Crape, MD on 05/28/2021  8:44 AM.      Referring physician: Hurshel Party, OD [phone] [facility] [address]  [place] [address]  HISTORICAL INFORMATION:   Selected notes from the MEDICAL RECORD NUMBER       CURRENT MEDICATIONS: No current outpatient medications on file. (Ophthalmic Drugs)   No current facility-administered medications for this visit. (Ophthalmic Drugs)   Current Outpatient Medications (Other)  Medication Sig   allopurinol (ZYLOPRIM) 300 MG tablet Take 150 mg by mouth daily.    b complex vitamins capsule Take 1 capsule by mouth daily.   Calcium Carbonate-Vitamin D (CALCIUM-VITAMIN D) 500-200 MG-UNIT per tablet Take 1 tablet by mouth 2 (two) times daily.    cholecalciferol (VITAMIN D) 1000 UNITS tablet Take 1,000 Units by mouth daily.   clopidogrel (PLAVIX) 75 MG tablet TAKE 1 TABLET BY MOUTH EVERY DAY   Continuous Blood Gluc Receiver (DEXCOM G6 RECEIVER) DEVI USE AS DIRECTED ONCE A DAY 90 DAYS   Continuous Blood Gluc Sensor (FREESTYLE LIBRE 2 SENSOR) MISC Inject into the skin. (Patient not taking: Reported on 02/23/2021)   Continuous Blood Gluc Transmit (DEXCOM G6 TRANSMITTER) MISC USE ONE TRANSMITTER EVERY 90 DAYS   Cyanocobalamin (VITAMIN B-12 PO)  Take 1 tablet by mouth daily.   ferrous fumarate (HEMOCYTE - 106 MG FE) 325 (106 FE) MG TABS tablet Take 1 tablet by mouth daily before lunch.   furosemide (LASIX) 40 MG tablet Take 40 mg by mouth daily.   insulin aspart protamine- aspart (NOVOLOG MIX 70/30) (70-30) 100 UNIT/ML injection Inject 30 Units into the skin daily with breakfast.   insulin lispro (HUMALOG) 100 UNIT/ML KwikPen Inject 3-5 Units into the skin 2 (two) times daily.   isosorbide mononitrate (IMDUR) 120 MG 24 hr tablet Take 1 tablet (120 mg total) by mouth daily.   loratadine (CLARITIN) 10 MG tablet Take 10 mg by mouth daily.   losartan (COZAAR) 50 MG tablet Take 1 tablet (50 mg total) by mouth daily.   metoprolol tartrate (LOPRESSOR) 50 MG tablet TAKE 1 TABLET BY MOUTH TWICE A DAY   Multiple Vitamin (MULTIVITAMIN) capsule Take 1 capsule by mouth daily.   nitroGLYCERIN (NITROSTAT) 0.4 MG SL tablet Place 1 tablet (0.4 mg total) under the tongue every 5 (five) minutes as needed for chest pain.   Omega-3 Fatty Acids (FISH OIL) 1000 MG CAPS Take 1,000 mg by mouth daily.   potassium chloride (KLOR-CON) 10 MEQ tablet Take 1 tablet (10 mEq total) by mouth daily.   rosuvastatin (CRESTOR)  20 MG tablet Take 1 tablet (20 mg total) by mouth daily.   TRULICITY 1.5 MG/0.5ML SOPN Inject 1.5 mg into the skin every Saturday. (Patient not taking: Reported on 02/23/2021)   vitamin C (ASCORBIC ACID) 500 MG tablet Take 500 mg by mouth daily.   XIGDUO XR 5-500 MG TB24 Take 1 tablet by mouth 2 (two) times daily.   No current facility-administered medications for this visit. (Other)      REVIEW OF SYSTEMS:    ALLERGIES Allergies  Allergen Reactions   Sulfa Antibiotics Nausea Only    PAST MEDICAL HISTORY Past Medical History:  Diagnosis Date   Chest pain    Diabetes mellitus without complication (HCC)    GERD (gastroesophageal reflux disease)    Hyperlipidemia    Hypertension    Rheumatoid arthritis(714.0)    Past Surgical History:   Procedure Laterality Date   CARDIAC CATHETERIZATION N/A 08/11/2015   Procedure: Left Heart Cath and Coronary Angiography;  Surgeon: Lyn Records, MD;  Location: [facility] INVASIVE CV LAB;  Service: Cardiovascular;  Laterality: N/A;   CARDIAC CATHETERIZATION N/A 08/11/2015   Procedure: Coronary Stent Intervention;  Surgeon: Lyn Records, MD;  Location: [facility] INVASIVE CV LAB;  Service: Cardiovascular;  Laterality: N/A;   CARPAL TUNNEL RELEASE Right    CATARACT EXTRACTION Bilateral 2019   CORONARY ANGIOPLASTY WITH STENT PLACEMENT  2011   DES to the circumflex /notes 08/08/2015   LEFT HEART CATH AND CORONARY ANGIOGRAPHY N/A 06/01/2018   Procedure: LEFT HEART CATH AND CORONARY ANGIOGRAPHY;  Surgeon: Swaziland, Peter M, MD;  Location: [facility] INVASIVE CV LAB;  Service: Cardiovascular;  Laterality: N/A;   LEFT HEART CATH AND CORONARY ANGIOGRAPHY N/A 11/07/2020   Procedure: LEFT HEART CATH AND CORONARY ANGIOGRAPHY;  Surgeon: Lyn Records, MD;  Location: [facility] INVASIVE CV LAB;  Service: Cardiovascular;  Laterality: N/A;   NEUROPLASTY / TRANSPOSITION MEDIAN NERVE AT CARPAL TUNNEL Left    ROUX-EN-Y GASTRIC BYPASS  2002   TONSILLECTOMY      FAMILY HISTORY Family History  Problem Relation Age of Onset   Diabetes Mother    Hypertension Mother    Diabetes Brother    Hypertension Sister     SOCIAL HISTORY Social History   Tobacco Use   Smoking status: Never   Smokeless tobacco: Never  Substance Use Topics   Alcohol use: No    Alcohol/week: 0.0 standard drinks   Drug use: No         OPHTHALMIC EXAM:  Base Eye Exam     Visual Acuity (ETDRS)       Right Left   Dist cc 20/25 -1 20/20    Correction: Glasses         Tonometry (Tonopen, 8:11 AM)       Right Left   Pressure 17 15         Pupils       Pupils Dark Light Shape React APD   Right PERRL 3 3 Round Minimal None   Left PERRL 3 3 Round Minimal None         Visual Fields (Counting fingers)       Left Right    Full Full          Extraocular Movement       Right Left    Full Full         Neuro/Psych     Oriented x3: Yes   Mood/Affect: Normal         Dilation  Both eyes: 1.0% Mydriacyl, 2.5% Phenylephrine @ 8:11 AM           Slit Lamp and Fundus Exam     External Exam       Right Left   External Normal Normal         Slit Lamp Exam       Right Left   Lids/Lashes Normal Normal   Conjunctiva/Sclera White and quiet White and quiet   Cornea Clear Clear   Anterior Chamber Deep and quiet Deep and quiet   Iris Round and reactive Round and reactive   Lens Centered posterior chamber intraocular lens, 1+ Posterior capsular opacification Centered posterior chamber intraocular lens, 1+ Posterior capsular opacification   Anterior Vitreous Normal Normal         Fundus Exam       Right Left   Posterior Vitreous Posterior vitreous detachment Clear vitrectomized, Central vitreous floater rare   Disc Normal Normal   C/D Ratio 0.55 0.5   Macula Normal Normal   Vessels Normal Old BRVO superotemporal, extra macular , no nv   Periphery Normal Good PRP temporal 270 degrees, small break at 3:00 found at surgery, anterior  , Well treated,   attached 360            IMAGING AND PROCEDURES  Imaging and Procedures for 05/28/21  Color Fundus Photography Optos - OU - Both Eyes       Right Eye Progression has been stable. Disc findings include normal observations. Macula : normal observations. Vessels : normal observations. Periphery : normal observations.   Left Eye Progression has improved. Disc findings include normal observations.   Notes OS, clear media, cleared vitreous hemorrhage status post vitrectomy for good heme associated with BRVO, extra macular NVE, now with excellent acuity               ASSESSMENT/PLAN:  History of vitrectomy Stable OS observe  Vitreous hemorrhage of left eye (HCC) Condition cleared from vitrectomy September 2021  Diabetes mellitus with  complication (HCC) No sign of detectable DR  Stable branch retinal vein occlusion of left eye No recurrence of BRVO OS  Posterior vitreous detachment of right eye No holes or tears     ICD-10-CM   1. Branch retinal vein occlusion with neovascularization of left eye  H34.8321 Color Fundus Photography Optos - OU - Both Eyes    2. Retinal tear, left  H33.312     3. History of vitrectomy  Z98.890     4. Vitreous hemorrhage of left eye (HCC)  H43.12     5. Diabetes mellitus with complication (HCC)  E11.8     6. Stable branch retinal vein occlusion of left eye  H34.8322     7. Posterior vitreous detachment of right eye  H43.811       1.  OU no detectable diabetic retinopathy  2.  OS vitreous hemorrhage cleared post vitrectomy September 2021, good sector PRP for extra macular BRVO found at the time of surgery.  No recurrences.  3.  Dr. Cephus Richer completely and here as needed  Ophthalmic Meds Ordered this visit:  No orders of the defined types were placed in this encounter.      Return in about 2 years (around 05/29/2023) for DILATE OU, COLOR FP, OCT.  There are no Patient Instructions on file for this visit.   Explained the diagnoses, plan, and follow up with the patient and they expressed understanding.  Patient expressed understanding of the importance  of proper follow up care.   Darlene Rivera M.D. Diseases & Surgery of the Retina and Vitreous [facility] 05/28/21     Abbreviations: M myopia (nearsighted); A astigmatism; H hyperopia (farsighted); P presbyopia; Mrx spectacle prescription;  CTL contact lenses; OD right eye; OS left eye; OU both eyes  XT exotropia; ET esotropia; PEK punctate epithelial keratitis; PEE punctate epithelial erosions; DES dry eye syndrome; MGD meibomian gland dysfunction; ATs artificial tears; PFAT's preservative free artificial tears; NSC nuclear sclerotic cataract; PSC posterior subcapsular cataract; ERM epi-retinal  membrane; PVD posterior vitreous detachment; RD retinal detachment; DM diabetes mellitus; DR diabetic retinopathy; NPDR non-proliferative diabetic retinopathy; PDR proliferative diabetic retinopathy; CSME clinically significant macular edema; DME diabetic macular edema; dbh dot blot hemorrhages; CWS cotton wool spot; POAG primary open angle glaucoma; C/D cup-to-disc ratio; HVF humphrey visual field; GVF goldmann visual field; OCT optical coherence tomography; IOP intraocular pressure; BRVO Branch retinal vein occlusion; CRVO central retinal vein occlusion; CRAO central retinal artery occlusion; BRAO branch retinal artery occlusion; RT retinal tear; SB scleral buckle; PPV pars plana vitrectomy; VH Vitreous hemorrhage; PRP panretinal laser photocoagulation; IVK intravitreal kenalog; VMT vitreomacular traction; MH Macular hole;  NVD neovascularization of the disc; NVE neovascularization elsewhere; AREDS age related eye disease study; ARMD age related macular degeneration; POAG primary open angle glaucoma; EBMD epithelial/anterior basement membrane dystrophy; ACIOL anterior chamber intraocular lens; IOL intraocular lens; PCIOL posterior chamber intraocular lens; Phaco/IOL phacoemulsification with intraocular lens placement; PRK photorefractive keratectomy; LASIK laser assisted in situ keratomileusis; HTN hypertension; DM diabetes mellitus; COPD chronic obstructive pulmonary disease

## 2021-05-28 NOTE — Assessment & Plan Note (Signed)
No recurrence of BRVO OS

## 2021-09-02 NOTE — Progress Notes (Signed)
?Cardiology Office Note:   ? ?Date:  09/03/2021  ? ?ID:  Darlene Rivera, DOB 1951-01-26, MRN [id number] ? ?PCP:  Renford Dills, MD  ?Cardiologist:  Lesleigh Noe, MD  ? ?Referring MD: Renford Dills, MD  ? ?Chief Complaint  ?Patient presents with  ? Coronary Artery Disease  ? Congestive Heart Failure  ?  Diastolic  ? Hypertension  ? ? ?History of Present Illness:   ? ?Darlene Rivera is a 71 y.o. female with a hx of CAD with remote DES in circumflex, recent LAD diagonal bifurcation intervention (07/2015) with provisional stenting of LAD and angioplasty of diagonal with excellent result, hypertension, hyperlipidemia, and Diabetes Mellitus type II. ? ?Darlene Rivera has been doing well.  Her last office visit was 6 months ago.  At that time she was having mild calf tightness with walking and occasional chest discomfort.  She subsequently had a bilateral lower extremity Doppler study which was unremarkable. ? ?She is doing well.  She has stable angina.  She does not have claudication.  She states that her left leg feels cool at times if there is a bruits.  She has stable dyspnea on exertion.  No orthopnea PND.  Occasional lower extremity swelling is noted. ? ?She retired.  She is not as active as she was previously. ? ?Past Medical History:  ?Diagnosis Date  ? Chest pain   ? Diabetes mellitus without complication (HCC)   ? GERD (gastroesophageal reflux disease)   ? Hyperlipidemia   ? Hypertension   ? Rheumatoid arthritis(714.0)   ? ? ?Past Surgical History:  ?Procedure Laterality Date  ? CARDIAC CATHETERIZATION N/A 08/11/2015  ? Procedure: Left Heart Cath and Coronary Angiography;  Surgeon: Lyn Records, MD;  Location: [facility] INVASIVE CV LAB;  Service: Cardiovascular;  Laterality: N/A;  ? CARDIAC CATHETERIZATION N/A 08/11/2015  ? Procedure: Coronary Stent Intervention;  Surgeon: Lyn Records, MD;  Location: [facility] INVASIVE CV LAB;  Service: Cardiovascular;  Laterality: N/A;  ? CARPAL TUNNEL RELEASE Right   ? CATARACT  EXTRACTION Bilateral 2019  ? CORONARY ANGIOPLASTY WITH STENT PLACEMENT  2011  ? DES to the circumflex /notes 08/08/2015  ? LEFT HEART CATH AND CORONARY ANGIOGRAPHY N/A 06/01/2018  ? Procedure: LEFT HEART CATH AND CORONARY ANGIOGRAPHY;  Surgeon: Swaziland, Peter M, MD;  Location: [facility] INVASIVE CV LAB;  Service: Cardiovascular;  Laterality: N/A;  ? LEFT HEART CATH AND CORONARY ANGIOGRAPHY N/A 11/07/2020  ? Procedure: LEFT HEART CATH AND CORONARY ANGIOGRAPHY;  Surgeon: Lyn Records, MD;  Location: [facility] INVASIVE CV LAB;  Service: Cardiovascular;  Laterality: N/A;  ? NEUROPLASTY / TRANSPOSITION MEDIAN NERVE AT CARPAL TUNNEL Left   ? ROUX-EN-Y GASTRIC BYPASS  2002  ? TONSILLECTOMY    ? ? ?Current Medications: ?Current Meds  ?Medication Sig  ? allopurinol (ZYLOPRIM) 300 MG tablet Take 150 mg by mouth daily.   ? b complex vitamins capsule Take 1 capsule by mouth daily.  ? Calcium Carbonate-Vitamin D (CALCIUM-VITAMIN D) 500-200 MG-UNIT per tablet Take 1 tablet by mouth 2 (two) times daily.   ? cholecalciferol (VITAMIN D) 1000 UNITS tablet Take 1,000 Units by mouth daily.  ? clopidogrel (PLAVIX) 75 MG tablet TAKE 1 TABLET BY MOUTH EVERY DAY  ? Continuous Blood Gluc Receiver (DEXCOM G6 RECEIVER) DEVI USE AS DIRECTED ONCE A DAY 90 DAYS  ? Continuous Blood Gluc Transmit (DEXCOM G6 TRANSMITTER) MISC USE ONE TRANSMITTER EVERY 90 DAYS  ? Cyanocobalamin (VITAMIN B-12 PO) Take 1 tablet by mouth daily.  ?  ferrous fumarate (HEMOCYTE - 106 MG FE) 325 (106 FE) MG TABS tablet Take 1 tablet by mouth daily before lunch.  ? furosemide (LASIX) 40 MG tablet Take 40 mg by mouth daily.  ? insulin aspart protamine- aspart (NOVOLOG MIX 70/30) (70-30) 100 UNIT/ML injection Inject 30 Units into the skin daily with breakfast.  ? insulin lispro (HUMALOG) 100 UNIT/ML KwikPen Inject 3-5 Units into the skin 2 (two) times daily.  ? isosorbide mononitrate (IMDUR) 120 MG 24 hr tablet Take 1 tablet (120 mg total) by mouth daily.  ? loratadine (CLARITIN) 10 MG  tablet Take 10 mg by mouth daily.  ? losartan (COZAAR) 100 MG tablet Take 100 mg by mouth daily.  ? metoprolol tartrate (LOPRESSOR) 50 MG tablet TAKE 1 TABLET BY MOUTH TWICE A DAY  ? Multiple Vitamin (MULTIVITAMIN) capsule Take 1 capsule by mouth daily.  ? Omega-3 Fatty Acids (FISH OIL) 1000 MG CAPS Take 1,000 mg by mouth daily.  ? potassium chloride (KLOR-CON) 10 MEQ tablet Take 1 tablet (10 mEq total) by mouth daily.  ? rosuvastatin (CRESTOR) 20 MG tablet Take 1 tablet (20 mg total) by mouth daily.  ? vitamin C (ASCORBIC ACID) 500 MG tablet Take 500 mg by mouth daily.  ? XIGDUO XR 5-500 MG TB24 Take 1 tablet by mouth 2 (two) times daily.  ? [DISCONTINUED] losartan (COZAAR) 50 MG tablet Take 1 tablet (50 mg total) by mouth daily.  ?  ? ?Allergies:   Sulfa antibiotics and Dulaglutide  ? ?Social History  ? ?Socioeconomic History  ? Marital status: Single  ?  Spouse name: Not on file  ? Number of children: Not on file  ? Years of education: Not on file  ? Highest education level: Not on file  ?Occupational History  ? Not on file  ?Tobacco Use  ? Smoking status: Never  ? Smokeless tobacco: Never  ?Substance and Sexual Activity  ? Alcohol use: No  ?  Alcohol/week: 0.0 standard drinks  ? Drug use: No  ? Sexual activity: Not on file  ?Other Topics Concern  ? Not on file  ?Social History Narrative  ? Not on file  ? ?Social Determinants of Health  ? ?Financial Resource Strain: Not on file  ?Food Insecurity: Not on file  ?Transportation Needs: Not on file  ?Physical Activity: Not on file  ?Stress: Not on file  ?Social Connections: Not on file  ?  ? ?Family History: ?The patient's family history includes Diabetes in her brother and mother; Hypertension in her mother and sister. ? ?ROS:   ?Please see the history of present illness.    ?Darlene Rivera is in her feet and legs.  No leg discomfort.  New EKG changes.  All other systems reviewed and are negative. ? ?EKGs/Labs/Other Studies Reviewed:   ? ?The following studies were reviewed  today: ?MAY 2022: LVEDP normal: ?Diagnostic ?Dominance: Right ? ?Bilateral lower extremity Doppler study September 2022: ?Summary:  ?Right: Resting right ankle-brachial index is within normal range. No  ?evidence of significant right lower extremity arterial disease. The right  ?toe-brachial index is normal.  ? ?Left: Resting left ankle-brachial index is within normal range. No  ?evidence of significant left lower extremity arterial disease. The left  ?toe-brachial index is normal.  ? ? ?EKG:  EKG normal sinus rhythm, normal PR, right bundle branch block which is new when compared to the tracing from May 2022. ? ?Recent Labs: ?10/30/2020: BUN 22; Creatinine, Ser 0.85; Hemoglobin 13.0; Platelets 197; Potassium 4.7; Sodium 143  ?  Recent Lipid Panel ?No results found for: CHOL, TRIG, HDL, CHOLHDL, VLDL, LDLCALC, LDLDIRECT ? ?Physical Exam:   ? ?VS:  BP 122/72   Pulse 74   Ht 5\' 2"  (1.575 m)   Wt 227 lb 6.4 oz (103.1 kg)   SpO2 99%   BMI 41.59 kg/m?    ? ?Wt Readings from Last 3 Encounters:  ?09/03/21 227 lb 6.4 oz (103.1 kg)  ?02/23/21 233 lb 9.6 oz (106 kg)  ?12/20/20 211 lb (95.7 kg)  ?  ? ?GEN: Morbid obesity.  She is down 6 pounds compared to 1 year ago.. No acute distress ?HEENT: Normal ?NECK: No JVD. ?LYMPHATICS: No lymphadenopathy ?CARDIAC: No murmur. RRR no gallop, or edema. ?VASCULAR:  Normal Pulses. No bruits. ?RESPIRATORY:  Clear to auscultation without rales, wheezing or rhonchi  ?ABDOMEN: Soft, non-tender, non-distended, No pulsatile mass, ?MUSCULOSKELETAL: No deformity  ?SKIN: Warm and dry ?NEUROLOGIC:  Alert and oriented x 3 ?PSYCHIATRIC:  Normal affect  ? ?ASSESSMENT:   ? ?1. Coronary artery disease of native artery of native heart with stable angina pectoris (HCC)   ?2. Essential hypertension   ?3. Other hyperlipidemia   ?4. Diabetes mellitus with complication (HCC)   ?5. Bilateral carotid artery disease, unspecified type (HCC)   ?6. Right bundle branch block   ? ?PLAN:   ? ?In order of problems  listed above: ? ?Stable without change in anginal status.  Secondary prevention discussed.  Use of nitroglycerin is encouraged.  Moderate aerobic activity encouraged. ?Excellent blood pressure control on current therapy.

## 2021-09-03 ENCOUNTER — Encounter: Payer: Self-pay | Admitting: Interventional Cardiology

## 2021-09-03 ENCOUNTER — Other Ambulatory Visit: Payer: Self-pay

## 2021-09-03 ENCOUNTER — Ambulatory Visit: Payer: Medicare Other | Admitting: Interventional Cardiology

## 2021-09-03 VITALS — BP 122/72 | HR 74 | Ht 62.0 in | Wt 227.4 lb

## 2021-09-03 DIAGNOSIS — E7849 Other hyperlipidemia: Secondary | ICD-10-CM

## 2021-09-03 DIAGNOSIS — I10 Essential (primary) hypertension: Secondary | ICD-10-CM

## 2021-09-03 DIAGNOSIS — I25118 Atherosclerotic heart disease of native coronary artery with other forms of angina pectoris: Secondary | ICD-10-CM

## 2021-09-03 DIAGNOSIS — E118 Type 2 diabetes mellitus with unspecified complications: Secondary | ICD-10-CM | POA: Diagnosis not present

## 2021-09-03 DIAGNOSIS — I779 Disorder of arteries and arterioles, unspecified: Secondary | ICD-10-CM

## 2021-09-03 DIAGNOSIS — I4510 Unspecified right bundle-branch block: Secondary | ICD-10-CM

## 2021-09-03 NOTE — Patient Instructions (Signed)

## 2021-09-20 ENCOUNTER — Other Ambulatory Visit: Payer: Self-pay | Admitting: Interventional Cardiology

## 2022-02-25 ENCOUNTER — Other Ambulatory Visit: Payer: Self-pay | Admitting: Interventional Cardiology

## 2022-03-14 ENCOUNTER — Encounter: Payer: Self-pay | Admitting: Interventional Cardiology

## 2022-03-15 ENCOUNTER — Encounter: Payer: Self-pay | Admitting: Interventional Cardiology

## 2022-05-30 ENCOUNTER — Telehealth: Payer: Self-pay | Admitting: Interventional Cardiology

## 2022-05-30 NOTE — Telephone Encounter (Signed)
Returned call to patient.  Discussed need for handicap placard, patient states Dr. Katrinka Blazing had filled it out for her in the past and she is due for renewal.  Handicap placard renewal form signed by Dr. Katrinka Blazing and placed in mailbox to be mailed to patient per her request. Mailing address confirmed.  Patient expressed appreciation for assistance.

## 2022-05-30 NOTE — Telephone Encounter (Signed)
Patient states her handicap placard needs to be renewed. She says she left a form last Friday and she received a call that the office had a question.

## 2022-06-17 ENCOUNTER — Other Ambulatory Visit: Payer: Self-pay | Admitting: Interventional Cardiology

## 2022-08-12 ENCOUNTER — Other Ambulatory Visit: Payer: Self-pay | Admitting: *Deleted

## 2022-08-13 ENCOUNTER — Ambulatory Visit (HOSPITAL_COMMUNITY)
Admission: RE | Admit: 2022-08-13 | Discharge: 2022-08-13 | Disposition: A | Discharge status: Home / Self Care | Payer: Medicare Other | Source: Ambulatory Visit | Attending: Vascular Surgery | Admitting: Vascular Surgery

## 2022-08-13 ENCOUNTER — Other Ambulatory Visit (HOSPITAL_COMMUNITY): Payer: Self-pay | Admitting: Nurse Practitioner

## 2022-08-13 DIAGNOSIS — I739 Peripheral vascular disease, unspecified: Secondary | ICD-10-CM | POA: Diagnosis present

## 2022-08-13 LAB — VAS US ABI WITH/WO TBI
Left ABI: 0.98
Right ABI: 1.01

## 2022-09-09 ENCOUNTER — Other Ambulatory Visit: Payer: Self-pay | Admitting: *Deleted

## 2022-09-09 ENCOUNTER — Encounter (INDEPENDENT_AMBULATORY_CARE_PROVIDER_SITE_OTHER): Payer: Managed Care, Other (non HMO) | Admitting: Ophthalmology

## 2022-09-09 MED ORDER — ISOSORBIDE MONONITRATE ER 120 MG PO TB24: 120.0000 mg | ORAL_TABLET | Freq: Every day | ORAL | 0 refills | Status: DC

## 2022-09-10 ENCOUNTER — Other Ambulatory Visit: Payer: Self-pay | Admitting: *Deleted

## 2022-09-10 MED ORDER — METOPROLOL TARTRATE 50 MG PO TABS: 50.0000 mg | ORAL_TABLET | Freq: Two times a day (BID) | ORAL | 3 refills | Status: DC

## 2022-09-17 ENCOUNTER — Encounter (HOSPITAL_BASED_OUTPATIENT_CLINIC_OR_DEPARTMENT_OTHER): Payer: Self-pay | Admitting: Cardiovascular Disease

## 2022-09-17 ENCOUNTER — Ambulatory Visit (HOSPITAL_BASED_OUTPATIENT_CLINIC_OR_DEPARTMENT_OTHER): Payer: Medicare Other | Admitting: Cardiovascular Disease

## 2022-09-17 VITALS — BP 128/56 | HR 71 | Ht 62.0 in | Wt 219.4 lb

## 2022-09-17 DIAGNOSIS — E7849 Other hyperlipidemia: Secondary | ICD-10-CM | POA: Diagnosis not present

## 2022-09-17 DIAGNOSIS — I10 Essential (primary) hypertension: Secondary | ICD-10-CM | POA: Diagnosis not present

## 2022-09-17 DIAGNOSIS — I25118 Atherosclerotic heart disease of native coronary artery with other forms of angina pectoris: Secondary | ICD-10-CM | POA: Diagnosis not present

## 2022-09-17 DIAGNOSIS — I209 Angina pectoris, unspecified: Secondary | ICD-10-CM

## 2022-09-17 MED ORDER — RANOLAZINE ER 500 MG PO TB12: 500.0000 mg | ORAL_TABLET | Freq: Two times a day (BID) | ORAL | 3 refills | Status: DC

## 2022-09-17 NOTE — Patient Instructions (Signed)
Medication Instructions:  Your physician has recommended you make the following change in your medication:  Start: Renexa 500mg  Twice Daily   *If you need a refill on your cardiac medications before your next appointment, please call your pharmacy*   Follow-Up: At [facility], you and your health needs are our priority.  As part of our continuing mission to provide you with exceptional heart care, we have created designated Provider Care Teams.  These Care Teams include your primary Cardiologist (physician) and Advanced Practice Providers (APPs -  Physician Assistants and Nurse Practitioners) who all work together to provide you with the care you need, when you need it.  We recommend signing up for the patient portal called "MyChart".  Sign up information is provided on this After Visit Summary.  MyChart is used to connect with patients for Virtual Visits (Telemedicine).  Patients are able to view lab/test results, encounter notes, upcoming appointments, etc.  Non-urgent messages can be sent to your provider as well.   To learn more about what you can do with MyChart, go to [email or web address].    Your next appointment:   1 month virtual visit with Laurann Montana, NP   &   6-7 months with Dr. Oval Linsey

## 2022-09-17 NOTE — Progress Notes (Signed)
Cardiology Office Note:    Date:  09/17/2022   ID:  Darlene Rivera, DOB 1951-01-11, MRN [id number]  PCP:  Renford Dills, MD   [facility] Providers Cardiologist:  Lesleigh Noe, MD (Inactive)     Referring MD: Renford Dills, MD   No chief complaint on file.   History of Present Illness:    Darlene Rivera is a 72 y.o. female with a hx of of CAD with remote DES in circumflex, recent LAD diagonal bifurcation intervention (07/2015) with provisional stenting of LAD and angioplasty of diagonal with excellent result, hypertension, hyperlipidemia, and Diabetes Mellitus type II.   Her last cath 10/2020 showed progression of her RCA from 50 to 70% proximally with diffuse 50% mid RCA disease unchanged. Her LAD stent was patent, Her Jailed Diagonal had an ostial 85% stenosis. Left Circumflex stent was patent. Medical management was advised and Imdur was added. They noted that if her angina worsened her entire RCA would require stenting.  During her last visit with Dr. Katrinka Blazing she had been having calf tightness with walking and occasional chest discomfort. He felt that her angina was stable and encouraged her to use nitroglycerin as needed. He also encouraged her to increase her exercise. He noted a new RBBB. She had ABIs 07/2022 that were normal bilaterally.  Today, the patient states that she has been feeling short of breath with activity which has worsened recently. She noticed the SOB when walking short distances is worse later in the day or if she has to be very active. She sometimes gets associated chest pain or pressure which abates after rest. When she takes nitroglycerin the chest pain resolves. She does yoga and is planning to start swimming soon. She has reports intentional weight loss. She is unsure what her blood pressures at home have been, however she notes that she believes them to be slightly high. Her pressure in clinic at 140/66, which lessened to 128/56 after  recheck.  She denies any palpitations or peripheral edema. No lightheadedness, headaches, syncope, orthopnea, or PND.   Past Medical History:  Diagnosis Date   Chest pain    Diabetes mellitus without complication (HCC)    GERD (gastroesophageal reflux disease)    Hyperlipidemia    Hypertension    Rheumatoid arthritis(714.0)     Past Surgical History:  Procedure Laterality Date   CARDIAC CATHETERIZATION N/A 08/11/2015   Procedure: Left Heart Cath and Coronary Angiography;  Surgeon: Lyn Records, MD;  Location: [facility] INVASIVE CV LAB;  Service: Cardiovascular;  Laterality: N/A;   CARDIAC CATHETERIZATION N/A 08/11/2015   Procedure: Coronary Stent Intervention;  Surgeon: Lyn Records, MD;  Location: [facility] INVASIVE CV LAB;  Service: Cardiovascular;  Laterality: N/A;   CARPAL TUNNEL RELEASE Right    CATARACT EXTRACTION Bilateral 2019   CORONARY ANGIOPLASTY WITH STENT PLACEMENT  2011   DES to the circumflex /notes 08/08/2015   LEFT HEART CATH AND CORONARY ANGIOGRAPHY N/A 06/01/2018   Procedure: LEFT HEART CATH AND CORONARY ANGIOGRAPHY;  Surgeon: Swaziland, Peter M, MD;  Location: [facility] INVASIVE CV LAB;  Service: Cardiovascular;  Laterality: N/A;   LEFT HEART CATH AND CORONARY ANGIOGRAPHY N/A 11/07/2020   Procedure: LEFT HEART CATH AND CORONARY ANGIOGRAPHY;  Surgeon: Lyn Records, MD;  Location: [facility] INVASIVE CV LAB;  Service: Cardiovascular;  Laterality: N/A;   NEUROPLASTY / TRANSPOSITION MEDIAN NERVE AT CARPAL TUNNEL Left    ROUX-EN-Y GASTRIC BYPASS  2002   TONSILLECTOMY      Current  Medications: No outpatient medications have been marked as taking for the 09/17/22 encounter (Appointment) with Chilton Si, MD.     Allergies:   Sulfa antibiotics and Dulaglutide   Social History   Socioeconomic History   Marital status: Single    Spouse name: Not on file   Number of children: Not on file   Years of education: Not on file   Highest education level: Not on file  Occupational History   Not  on file  Tobacco Use   Smoking status: Never   Smokeless tobacco: Never  Substance and Sexual Activity   Alcohol use: No    Alcohol/week: 0.0 standard drinks of alcohol   Drug use: No   Sexual activity: Not on file  Other Topics Concern   Not on file  Social History Narrative   Not on file   Social Determinants of Health   Financial Resource Strain: Not on file  Food Insecurity: Not on file  Transportation Needs: Not on file  Physical Activity: Not on file  Stress: Not on file  Social Connections: Not on file     Family History: The patient's family history includes Diabetes in her brother and mother; Hypertension in her mother and sister.  ROS:   Please see the history of present illness.  (+)Chest pain (+) Shortness of breath (+)Weight Loss  All other systems reviewed and are negative.  EKGs/Labs/Other Studies Reviewed:    The following studies were reviewed today:  EKG:  EKG is personally reviewed 09/17/2022: NSR 71 bpm  Recent Labs: No results found for requested labs within last 365 days.  Recent Lipid Panel No results found for: "CHOL", "TRIG", "HDL", "CHOLHDL", "VLDL", "LDLCALC", "LDLDIRECT"   Physical Exam:    VS:  BP (!) 128/56 (BP Location: Right Arm, Patient Position: Sitting, Cuff Size: Large)   Pulse 71   Ht 5\' 2"  (1.575 m)   Wt 219 lb 6.4 oz (99.5 kg)   BMI 40.13 kg/m  , BMI Body mass index is 40.13 kg/m. GENERAL:  Well appearing HEENT: Pupils equal round and reactive, fundi not visualized, oral mucosa unremarkable NECK:  No jugular venous distention, waveform within normal limits, carotid upstroke brisk and symmetric, no bruits, no thyromegaly LUNGS:  Clear to auscultation bilaterally HEART:  RRR.  PMI not displaced or sustained,S1 and S2 within normal limits, no S3, no S4, no clicks, no rubs, no murmurs ABD:  Flat, positive bowel sounds normal in frequency in pitch, no bruits, no rebound, no guarding, no midline pulsatile mass, no  hepatomegaly, no splenomegaly EXT:  2 plus pulses throughout, no edema, no cyanosis no clubbing SKIN:  No rashes no nodules NEURO:  Cranial nerves II through XII grossly intact, motor grossly intact throughout PSYCH:  Cognitively intact, oriented to person place and time   ASSESSMENT:    No diagnosis found. PLAN:    In order of problems listed above:  # CAD s/p PCI:  # Shortness of Breath and angina: She has known underlying diffuse CAD in the RCA with 70% stenosis proximally.  She also has jailed diagonal with 85% ostial narrowing.  She has chronic angina which seems to be worse lately.  Imdur is maximized.  We will add Ranexa 500 mg twice daily.  Continue clopidogrel and metoprolol.  If her symptoms do not improve with Ranexa we will stop it.  Consider PCI, though it is known that she would need the entire RCA stented per Dr. Katrinka Blazing.  She would like to avoid this  if possible.  Hopefully with improved blood pressure control and Ranexa we can abort PCI.  # Hypertension: Blood pressure was initially elevated but better on repeat.  Adding Ranexa as above.  She will check her blood pressures at home and see what they are running.  Continue metoprolol and losartan.  # Dyslipidemia: Cholesterol levels well-controlled with Rosuvastatin. Continue Rosuvastatin.  # Weight Management: Patient reports weight loss through diet modification and plans to start water aerobics. Encourage continued weight loss efforts and regular physical activity.  Follow-up: Schedule a virtual visit in one month to assess response to Ranolazine and overall symptom management. Schedule an in-person follow-up visit in six months with Dr. Duke Salvia.   Medication Adjustments/Labs and Tests Ordered: Current medicines are reviewed at length with the patient today.  Concerns regarding medicines are outlined above.  No orders of the defined types were placed in this encounter.  No orders of the defined types were placed in  this encounter.   There are no Patient Instructions on file for this visit.    I,Coren O'Brien,acting as a Neurosurgeon for [employer], MD.,have documented all relevant documentation on the behalf of Chilton Si, MD,as directed by  Chilton Si, MD while in the presence of Chilton Si, MD.

## 2022-09-17 NOTE — Progress Notes (Signed)
Cardiology Office Note:    Date:  09/17/2022   ID:  Darlene Rivera, DOB 05-06-1951, MRN [id number]  PCP:  Estevan Oaks, NP   [facility] Providers Cardiologist:  Lesleigh Noe, MD (Inactive)     Referring MD: Renford Dills, MD   No chief complaint on file.   History of Present Illness:    Darlene Rivera is a 72 y.o. female with a hx of of CAD with remote DES in circumflex, recent LAD diagonal bifurcation intervention (07/2015) with provisional stenting of LAD and angioplasty of diagonal with excellent result, hypertension, hyperlipidemia, and Diabetes Mellitus type II.   Her last cath 10/2020 showed progression of her RCA from 50 to 70% proximally with diffuse 50% mid RCA disease unchanged. Her LAD stent was patent, Her Jailed Diagonal had an ostial 85% stenosis. Left Circumflex stent was patent. Medical management was advised and Imdur was added. They noted that if her angina worsened her entire RCA would require stenting.  During her last visit with Dr. Katrinka Blazing she had been having calf tightness with walking and occasional chest discomfort. He felt that her angina was stable and encouraged her to use nitroglycerin as needed. He also encouraged her to increase her exercise. He noted a new RBBB. She had ABIs 07/2022 that were normal bilaterally.  Today, the patient states that she has been feeling short of breath with activity which has worsened recently. She noticed the SOB when walking short distances is worse later in the day or if she has to be very active. She sometimes gets associated chest pain or pressure which abates after rest. When she takes nitroglycerin the chest pain resolves. She does yoga and is planning to start swimming soon. She has reports intentional weight loss. She is unsure what her blood pressures at home have been, however she notes that she believes them to be slightly high. Her pressure in clinic at 140/66, which lessened to 128/56 after  recheck.  She denies any palpitations or peripheral edema. No lightheadedness, headaches, syncope, orthopnea, or PND.   Past Medical History:  Diagnosis Date   Chest pain    Diabetes mellitus without complication    GERD (gastroesophageal reflux disease)    Hyperlipidemia    Hypertension    Rheumatoid arthritis(714.0)     Past Surgical History:  Procedure Laterality Date   CARDIAC CATHETERIZATION N/A 08/11/2015   Procedure: Left Heart Cath and Coronary Angiography;  Surgeon: Lyn Records, MD;  Location: [facility] INVASIVE CV LAB;  Service: Cardiovascular;  Laterality: N/A;   CARDIAC CATHETERIZATION N/A 08/11/2015   Procedure: Coronary Stent Intervention;  Surgeon: Lyn Records, MD;  Location: [facility] INVASIVE CV LAB;  Service: Cardiovascular;  Laterality: N/A;   CARPAL TUNNEL RELEASE Right    CATARACT EXTRACTION Bilateral 2019   CORONARY ANGIOPLASTY WITH STENT PLACEMENT  2011   DES to the circumflex /notes 08/08/2015   LEFT HEART CATH AND CORONARY ANGIOGRAPHY N/A 06/01/2018   Procedure: LEFT HEART CATH AND CORONARY ANGIOGRAPHY;  Surgeon: Swazil[place], Peter M, MD;  Location: [facility] INVASIVE CV LAB;  Service: Cardiovascular;  Laterality: N/A;   LEFT HEART CATH AND CORONARY ANGIOGRAPHY N/A 11/07/2020   Procedure: LEFT HEART CATH AND CORONARY ANGIOGRAPHY;  Surgeon: Lyn Records, MD;  Location: [facility] INVASIVE CV LAB;  Service: Cardiovascular;  Laterality: N/A;   NEUROPLASTY / TRANSPOSITION MEDIAN NERVE AT CARPAL TUNNEL Left    ROUX-EN-Y GASTRIC BYPASS  2002   TONSILLECTOMY      Current  Medications: Current Meds  Medication Sig   allopurinol (ZYLOPRIM) 300 MG tablet Take 150 mg by mouth daily.    b complex vitamins capsule Take 1 capsule by mouth daily.   Calcium Carbonate-Vitamin D (CALCIUM-VITAMIN D) 500-200 MG-UNIT per tablet Take 1 tablet by mouth 2 (two) times daily.    cholecalciferol (VITAMIN D) 1000 UNITS tablet Take 1,000 Units by mouth daily.   clopidogrel (PLAVIX) 75 MG tablet TAKE 1 TABLET BY  MOUTH EVERY DAY   Continuous Blood Gluc Sensor (DEXCOM G7 SENSOR) MISC Inject into the skin once a week.   Cyanocobalamin (VITAMIN B-12 PO) Take 1 tablet by mouth daily.   ferrous fumarate (HEMOCYTE - 106 MG FE) 325 (106 FE) MG TABS tablet Take 1 tablet by mouth daily before lunch.   furosemide (LASIX) 40 MG tablet Take 40 mg by mouth daily.   isosorbide mononitrate (IMDUR) 120 MG 24 hr tablet Take 1 tablet (120 mg total) by mouth daily.   loratadine (CLARITIN) 10 MG tablet Take 10 mg by mouth daily.   losartan (COZAAR) 50 MG tablet Take 1 tablet (50 mg total) by mouth daily.   metoprolol tartrate (LOPRESSOR) 50 MG tablet Take 1 tablet (50 mg total) by mouth 2 (two) times daily.   Multiple Vitamin (MULTIVITAMIN) capsule Take 1 capsule by mouth daily.   nitroGLYCERIN (NITROSTAT) 0.4 MG SL tablet PLACE 1 TABLET UNDER THE TONGUE EVERY 5 MINUTES AS NEEDED FOR CHEST PAIN.   Omega-3 Fatty Acids (FISH OIL) 1000 MG CAPS Take 1,000 mg by mouth daily.   potassium chloride (KLOR-CON) 10 MEQ tablet TAKE 1 TABLET BY MOUTH EVERY DAY   ranolazine (RANEXA) 500 MG 12 hr tablet Take 1 tablet (500 mg total) by mouth 2 (two) times daily.   rosuvastatin (CRESTOR) 20 MG tablet Take 1 tablet (20 mg total) by mouth daily.   vitamin C (ASCORBIC ACID) 500 MG tablet Take 500 mg by mouth daily.   XIGDUO XR 5-500 MG TB24 Take 1 tablet by mouth 2 (two) times daily.   [DISCONTINUED] insulin aspart protamine- aspart (NOVOLOG MIX 70/30) (70-30) 100 UNIT/ML injection Inject 30 Units into the skin daily with breakfast.     Allergies:   Sulfa antibiotics and Dulaglutide   Social History   Socioeconomic History   Marital status: Single    Spouse name: Not on file   Number of children: Not on file   Years of education: Not on file   Highest education level: Not on file  Occupational History   Not on file  Tobacco Use   Smoking status: Never   Smokeless tobacco: Never  Substance and Sexual Activity   Alcohol use: No     Alcohol/week: 0.0 standard drinks of alcohol   Drug use: No   Sexual activity: Not on file  Other Topics Concern   Not on file  Social History Narrative   Not on file   Social Determinants of Health   Financial Resource Strain: Not on file  Food Insecurity: Not on file  Transportation Needs: Not on file  Physical Activity: Not on file  Stress: Not on file  Social Connections: Not on file     Family History: The patient's family history includes Diabetes in her brother and mother; Hypertension in her mother and sister.  ROS:   Please see the history of present illness.  (+)Chest pain (+) Shortness of breath (+)Weight Loss  All other systems reviewed and are negative.  EKGs/Labs/Other Studies Reviewed:    The  following studies were reviewed today:  EKG:  EKG is personally reviewed 09/17/2022: NSR 71 bpm  Recent Labs: No results found for requested labs within last 365 days.  Recent Lipid Panel No results found for: "CHOL", "TRIG", "HDL", "CHOLHDL", "VLDL", "LDLCALC", "LDLDIRECT"   Physical Exam:    VS:  BP (!) 128/56 (BP Location: Right Arm, Patient Position: Sitting, Cuff Size: Large)   Pulse 71   Ht 5\' 2"  (1.575 m)   Wt 219 lb 6.4 oz (99.5 kg)   BMI 40.13 kg/m  , BMI Body mass index is 40.13 kg/m. GENERAL:  Well appearing HEENT: Pupils equal round and reactive, fundi not visualized, oral mucosa unremarkable NECK:  No jugular venous distention, waveform within normal limits, carotid upstroke brisk and symmetric, no bruits, no thyromegaly LUNGS:  Clear to auscultation bilaterally HEART:  RRR.  PMI not displaced or sustained,S1 and S2 within normal limits, no S3, no S4, no clicks, no rubs, no murmurs ABD:  Flat, positive bowel sounds normal in frequency in pitch, no bruits, no rebound, no guarding, no midline pulsatile mass, no hepatomegaly, no splenomegaly EXT:  2 plus pulses throughout, no edema, no cyanosis no clubbing SKIN:  No rashes no nodules NEURO:   Cranial nerves II through XII grossly intact, motor grossly intact throughout PSYCH:  Cognitively intact, oriented to person place and time   ASSESSMENT:    1. Primary hypertension   2. Atherosclerosis of native coronary artery of native heart with stable angina pectoris   3. Angina pectoris   4. Other hyperlipidemia    PLAN:    In order of problems listed above:  # CAD s/p PCI:  # Shortness of Breath and angina: She has known underlying diffuse CAD in the RCA with 70% stenosis proximally.  She also has jailed diagonal with 85% ostial narrowing.  She has chronic angina which seems to be worse lately.  Imdur is maximized.  We will add Ranexa 500 mg twice daily.  Continue clopidogrel and metoprolol.  If her symptoms do not improve with Ranexa we will stop it.  Consider PCI, though it is known that she would need the entire RCA stented per Dr. Katrinka Blazing.  She would like to avoid this if possible.  Hopefully with improved blood pressure control and Ranexa we can abort PCI.  # Hypertension: Blood pressure was initially elevated but better on repeat.  Adding Ranexa as above.  She will check her blood pressures at home and see what they are running.  Continue metoprolol and losartan.  # Dyslipidemia: Cholesterol levels well-controlled with Rosuvastatin. Continue Rosuvastatin.  # Weight Management: Patient reports weight loss through diet modification and plans to start water aerobics. Encourage continued weight loss efforts and regular physical activity.  Follow-up: Schedule a virtual visit in one month to assess response to Ranolazine and overall symptom management. Schedule an in-person follow-up visit in six months with Dr. Duke Salvia.   Medication Adjustments/Labs and Tests Ordered: Current medicines are reviewed at length with the patient today.  Concerns regarding medicines are outlined above.  Orders Placed This Encounter  Procedures   EKG 12-Lead    Meds ordered this encounter   Medications   ranolazine (RANEXA) 500 MG 12 hr tablet    Sig: Take 1 tablet (500 mg total) by mouth 2 (two) times daily.    Dispense:  180 tablet    Refill:  3     Patient Instructions  Medication Instructions:  Your physician has recommended you make the following change  in your medication:  Start: Renexa 500mg  Twice Daily   *If you need a refill on your cardiac medications before your next appointment, please call your pharmacy*   Follow-Up: At [facility], you and your health needs are our priority.  As part of our continuing mission to provide you with exceptional heart care, we have created designated Provider Care Teams.  These Care Teams include your primary Cardiologist (physician) and Advanced Practice Providers (APPs -  Physician Assistants and Nurse Practitioners) who all work together to provide you with the care you need, when you need it.  We recommend signing up for the patient portal called "MyChart".  Sign up information is provided on this After Visit Summary.  MyChart is used to connect with patients for Virtual Visits (Telemedicine).  Patients are able to view lab/test results, encounter notes, upcoming appointments, etc.  Non-urgent messages can be sent to your provider as well.   To learn more about what you can do with MyChart, go to [email or web address].    Your next appointment:   1 month virtual visit with Gillian Shields, NP   &   6-7 months with Dr. Darcus Austin O'Brien,acting as a scribe for Chilton Si, MD.,have documented all relevant documentation on the behalf of Chilton Si, MD,as directed by  Chilton Si, MD while in the presence of Chilton Si, MD.

## 2022-10-07 ENCOUNTER — Other Ambulatory Visit: Payer: Self-pay | Admitting: *Deleted

## 2022-10-07 MED ORDER — ROSUVASTATIN CALCIUM 20 MG PO TABS: 20.0000 mg | ORAL_TABLET | Freq: Every day | ORAL | 3 refills | Status: DC

## 2022-10-18 ENCOUNTER — Telehealth (INDEPENDENT_AMBULATORY_CARE_PROVIDER_SITE_OTHER): Payer: Medicare Other | Admitting: Family

## 2022-10-18 ENCOUNTER — Encounter (HOSPITAL_BASED_OUTPATIENT_CLINIC_OR_DEPARTMENT_OTHER): Payer: Self-pay | Admitting: Family

## 2022-10-18 VITALS — BP 120/63 | HR 76 | Ht 62.0 in | Wt 213.0 lb

## 2022-10-18 DIAGNOSIS — I25118 Atherosclerotic heart disease of native coronary artery with other forms of angina pectoris: Secondary | ICD-10-CM | POA: Diagnosis not present

## 2022-10-18 DIAGNOSIS — I10 Essential (primary) hypertension: Secondary | ICD-10-CM | POA: Diagnosis not present

## 2022-10-18 DIAGNOSIS — E785 Hyperlipidemia, unspecified: Secondary | ICD-10-CM

## 2022-10-18 NOTE — Patient Instructions (Signed)
Medication Instructions:  Your physician has recommended you make the following change in your medication:  REDUCE Losartan to half tablet daily  *If you need a refill on your cardiac medications before your next appointment, please call your pharmacy*  Follow-Up: At [facility], you and your health needs are our priority.  As part of our continuing mission to provide you with exceptional heart care, we have created designated Provider Care Teams.  These Care Teams include your primary Cardiologist (physician) and Advanced Practice Providers (APPs -  Physician Assistants and Nurse Practitioners) who all work together to provide you with the care you need, when you need it.  We recommend signing up for the patient portal called "MyChart".  Sign up information is provided on this After Visit Summary.  MyChart is used to connect with patients for Virtual Visits (Telemedicine).  Patients are able to view lab/test results, encounter notes, upcoming appointments, etc.  Non-urgent messages can be sent to your provider as well.   To learn more about what you can do with MyChart, go to [email or web address].    Your next appointment:   As scheduled in September with Dr. Duke Salvia  Other Instructions  We will send you a MyChart message in 2 weeks to check on your symptoms (shortness of breath, chest pain). If they are not improved on the Ranexa we can discuss scheduling a cardiac catheterization at that time.  Our goal is for your blood pressure to be less than 130/80.

## 2022-10-18 NOTE — Progress Notes (Signed)
Virtual Visit via Video Note   Because of AHNYLAH GLIDEWELL co-morbid illnesses, she is at least at moderate risk for complications without adequate follow up.  This format is felt to be most appropriate for this patient at this time.  All issues noted in this document were discussed and addressed.  A limited physical exam was performed with this format.  Please refer to the patient's chart for her consent to telehealth for [facility].    Date:  10/18/2022   ID:  Darlene Rivera, DOB 17-Aug-1950, MRN [id number] The patient was identified using 2 identifiers.  Patient Location: Home Provider Location: Office/Clinic   PCP:  Estevan Oaks, NP   [facility] Providers Cardiologist:  Chilton Si, MD     Evaluation Performed:  Follow-Up Visit  Chief Complaint: Follow-up after addition of Ranexa  History of Present Illness:    Darlene Rivera is a 72 y.o. female with CAD (remote DES-Cx, 07/2015 LAD diagonal bifurcation intervention with provisional stenting of LAD and angioplasty of diagonal), HTN, HLD, DM2  Her most recent cath 10/2020 showed progression of her RCA from 50 to 70% proximally with diffuse 50% mid RCA disease unchanged. Her LAD stent was patent, the jailed Diagonal had an ostial 85% stenosis. Left Circumflex stent was patent. Medical management was advised and Imdur was added. They noted that if her angina progressed her entire RCA would require stenting.  Seen 08/2021 by Dr. Katrinka Blazing with new RBBB and stable anginal symptoms recommended for PRN nitroglycerin. Due to calf tightness with walking with subsequent ABI 07/2022 unremarkable.   Established with Dr. Duke Salvia 09/17/22 after Dr. Michaelle Copas retirement. She noted worsening exertional dyspnea with occasional chest discomfort resolved with nitroglycerin.  Ranexa 500 mg twice daily was initiated.  Plavix and metoprolol were continued.  Known underlying diffuse CAD with RCA 70% stenosis proximally  and jailed diagonal 85% narrowing.  Imdur was already maximized.  Plan to consider PCI that would need entire RCA stented per Dr. Katrinka Blazing.  Presents today for follow-up.  Tells me she felt dyspnea initially when taking Ranexa but it has improved.  She does note it is a bit expensive at 130 for 71-month supply. She feels her breathing is a bit better since starting the Ranexa. Has not experienced much chest pain since her last office visit. 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  Working on weight loss by adjusting her diet.. BP readings such as 113/68, 113/59, 93/61, 104/59. Notes some lightheadedness particularly in the morning after her Ranexa.   Past Medical History:  Diagnosis Date   Chest pain    Diabetes mellitus without complication (HCC)    GERD (gastroesophageal reflux disease)    Hyperlipidemia    Hypertension    Rheumatoid arthritis(714.0)    Past Surgical History:  Procedure Laterality Date   CARDIAC CATHETERIZATION N/A 08/11/2015   Procedure: Left Heart Cath and Coronary Angiography;  Surgeon: Lyn Records, MD;  Location: [facility] INVASIVE CV LAB;  Service: Cardiovascular;  Laterality: N/A;   CARDIAC CATHETERIZATION N/A 08/11/2015   Procedure: Coronary Stent Intervention;  Surgeon: Lyn Records, MD;  Location: [facility] INVASIVE CV LAB;  Service: Cardiovascular;  Laterality: N/A;   CARPAL TUNNEL RELEASE Right    CATARACT EXTRACTION Bilateral 2019   CORONARY ANGIOPLASTY WITH STENT PLACEMENT  2011   DES to the circumflex /notes 08/08/2015   LEFT HEART CATH AND CORONARY ANGIOGRAPHY N/A 06/01/2018   Procedure: LEFT HEART CATH  AND CORONARY ANGIOGRAPHY;  Surgeon: [place], Peter M, MD;  Location: [facility] INVASIVE CV LAB;  Service: Cardiovascular;  Laterality: N/A;   LEFT HEART CATH AND CORONARY ANGIOGRAPHY N/A 11/07/2020   Procedure: LEFT HEART CATH AND CORONARY ANGIOGRAPHY;  Surgeon: Lyn Records, MD;  Location: [facility] INVASIVE CV LAB;  Service: Cardiovascular;  Laterality: N/A;    NEUROPLASTY / TRANSPOSITION MEDIAN NERVE AT CARPAL TUNNEL Left    ROUX-EN-Y GASTRIC BYPASS  2002   TONSILLECTOMY       Current Meds  Medication Sig   allopurinol (ZYLOPRIM) 300 MG tablet Take 150 mg by mouth daily.    b complex vitamins capsule Take 1 capsule by mouth daily.   Calcium Carbonate-Vitamin D (CALCIUM-VITAMIN D) 500-200 MG-UNIT per tablet Take 1 tablet by mouth 2 (two) times daily.    cholecalciferol (VITAMIN D) 1000 UNITS tablet Take 1,000 Units by mouth daily.   clopidogrel (PLAVIX) 75 MG tablet TAKE 1 TABLET BY MOUTH EVERY DAY   Continuous Blood Gluc Sensor (DEXCOM G7 SENSOR) MISC Inject into the skin once a week.   Cyanocobalamin (VITAMIN B-12 PO) Take 1 tablet by mouth daily.   ferrous fumarate (HEMOCYTE - 106 MG FE) 325 (106 FE) MG TABS tablet Take 1 tablet by mouth daily before lunch.   furosemide (LASIX) 40 MG tablet Take 40 mg by mouth daily.   insulin aspart protamine - aspart (NOVOLOG MIX 70/30 FLEXPEN) (70-30) 100 UNIT/ML FlexPen Inject 35 Units into the skin 2 (two) times daily with a meal.   isosorbide mononitrate (IMDUR) 120 MG 24 hr tablet Take 1 tablet (120 mg total) by mouth daily.   loratadine (CLARITIN) 10 MG tablet Take 10 mg by mouth daily.   losartan (COZAAR) 50 MG tablet Take 1 tablet (50 mg total) by mouth daily.   metoprolol tartrate (LOPRESSOR) 50 MG tablet Take 1 tablet (50 mg total) by mouth 2 (two) times daily.   Multiple Vitamin (MULTIVITAMIN) capsule Take 1 capsule by mouth daily.   nitroGLYCERIN (NITROSTAT) 0.4 MG SL tablet PLACE 1 TABLET UNDER THE TONGUE EVERY 5 MINUTES AS NEEDED FOR CHEST PAIN.   Omega-3 Fatty Acids (FISH OIL) 1000 MG CAPS Take 1,000 mg by mouth daily.   potassium chloride (KLOR-CON) 10 MEQ tablet TAKE 1 TABLET BY MOUTH EVERY DAY   ranolazine (RANEXA) 500 MG 12 hr tablet Take 1 tablet (500 mg total) by mouth 2 (two) times daily.   rosuvastatin (CRESTOR) 20 MG tablet Take 1 tablet (20 mg total) by mouth daily.   vitamin C  (ASCORBIC ACID) 500 MG tablet Take 500 mg by mouth daily.   XIGDUO XR 5-500 MG TB24 Take 1 tablet by mouth 2 (two) times daily.     Allergies:   Sulfa antibiotics and Dulaglutide   Social History   Tobacco Use   Smoking status: Never   Smokeless tobacco: Never  Substance Use Topics   Alcohol use: No    Alcohol/week: 0.0 standard drinks of alcohol   Drug use: No     Family Hx: The patient's family history includes Diabetes in her brother and mother; Hypertension in her mother and sister.  ROS:   Please see the history of present illness.    All other systems reviewed and are negative.  Prior CV studies:   The following studies were reviewed today:  Cardiac Studies & Procedures   CARDIAC CATHETERIZATION  CARDIAC CATHETERIZATION 11/07/2020  Narrative  There is progression of disease in the proximal RCA from 50% to 70%.  There is diffuse segmental 50% stenosis in the mid right coronary unchanged.  Short left main  Widely patent LAD including the mid vessel stent.  The jailed diagonal contains ostial 85% narrowing.  Patent stent in the proximal circumflex with generalized luminal irregularities noted.  Normal LVEDP.  RECOMMENDATIONS:   Continue aspirin and Plavix.  Progress anti-ischemic therapy by adding long-acting nitrates, isosorbide mononitrate 30 mg/day.  Aggressive risk factor modification.  If angina interferes with quality of life, consider stenting the entire proximal to distal RCA segment.  Findings Coronary Findings Diagnostic  Dominance: Right  Left Anterior Descending The vessel exhibits minimal luminal irregularities. Non-stenotic Prox LAD lesion was previously treated.  First Diagonal Branch Ost 1st Diag lesion is 85% stenosed. The lesion is discrete.  Left Circumflex The vessel exhibits minimal luminal irregularities. Non-stenotic Prox Cx lesion was previously treated.  Right Coronary Artery The vessel exhibits minimal luminal  irregularities. Prox RCA lesion is 75% stenosed. Prox RCA to Mid RCA lesion is 45% stenosed. The lesion is segmental. The lesion is moderately calcified. Dist RCA lesion is 30% stenosed.  Right Posterior Descending Artery The vessel exhibits minimal luminal irregularities.  Intervention  No interventions have been documented.   CARDIAC CATHETERIZATION  CARDIAC CATHETERIZATION 06/01/2018  Narrative  Previously placed Prox LAD stent (unknown type) is widely patent.  Ost 1st Diag lesion is 85% stenosed.  Previously placed Prox Cx stent (unknown type) is widely patent.  Prox RCA to Mid RCA lesion is 45% stenosed.  Dist RCA lesion is 30% stenosed.  The left ventricular systolic function is normal.  LV end diastolic pressure is normal.  The left ventricular ejection fraction is 55-65% by visual estimate.  1. Single vessel obstructive CAD involving a small first diagonal. This vessel is acutely angulated from the LAD and is covered by the LAD stent. It is only 2 mm in diameter. 2. Continued patency of stents in the LAD and LCx. 3. Normal LV function 4. Normal LVEDP  Plan: continue medical therapy.  Findings Coronary Findings Diagnostic  Dominance: Right  Left Anterior Descending Previously placed Prox LAD stent (unknown type) is widely patent.  First Diagonal Branch Ost 1st Diag lesion is 85% stenosed. The lesion is discrete.  Left Circumflex Previously placed Prox Cx stent (unknown type) is widely patent.  Right Coronary Artery Prox RCA to Mid RCA lesion is 45% stenosed. The lesion is segmental. The lesion is moderately calcified. Dist RCA lesion is 30% stenosed.  Intervention  No interventions have been documented.   STRESS TESTS  MYOCARDIAL PERFUSION IMAGING 07/14/2015  Narrative  Nuclear stress EF: 79%.  The left ventricular ejection fraction is hyperdynamic (>65%).  There was no ST segment deviation noted during stress.  No T wave inversion was  noted during stress.  Defect 1: There is a small defect of moderate severity present in the mid inferolateral location. Consistent with prior infarct in the left circumflex artery territory.  Defect 2: There is a small defect of moderate severity present in the apical anterior and apex location. Consistent with ischemia in the left anterior descending artery territory.  This is a low risk study.               Labs/Other Tests and Data Reviewed:    EKG:  No ECG reviewed.  Recent Labs: No results found for requested labs within last 365 days.   Recent Lipid Panel No results found for: "CHOL", "TRIG", "HDL", "CHOLHDL", "LDLCALC", "LDLDIRECT"  Wt Readings from Last 3 Encounters:  10/18/22 213 lb (96.6 kg)  09/17/22 219 lb 6.4 oz (99.5 kg)  09/03/21 227 lb 6.4 oz (103.1 kg)        Objective:    Vital Signs:  BP 120/63   Pulse 76   Ht 5\' 2"  (1.575 m)   Wt 213 lb (96.6 kg) Comment: unable to weigh from home  BMI 38.96 kg/m    VITAL SIGNS:  reviewed GEN:  no acute distress RESPIRATORY:  normal respiratory effort, symmetric expansion CARDIOVASCULAR:  no peripheral edema  ASSESSMENT & PLAN:    CAD s/p PCI/exertional dyspnea/angina-Notes few episodes of chest discomfort but still with some exertional dyspnea at night despite addition of Ranexa.  Continue Ranexa 500 mg twice daily.  Hesitant to increase dose due to some lightheadedness, dizziness.  On max dose Imdur.  Again discussed possible cardiac catheterization but she would send she is to adjust her blood pressure medicine, as below to see if her symptoms improve prior to proceeding with LHC as we are aware that she will likely need stenting of complete RCA.  Check in on symptoms in 1 to 2 weeks via MyChart.  Hypertension-reports hypotensive readings at home associated lightheadedness.  Reduce losartan to 25 mg daily.  She will take 1/2 tablet of her 50 mg tablet.  Check in via MyChart message in 1 to 2 weeks. Discussed to  monitor BP at home at least 2 hours after medications and sitting for 5-10 minutes.   Hyperlipidemia, LDL goal less than 70 -continue rosuvastatin 20 mg daily.  Weight management -  Weight loss via diet and exercise encouraged. Discussed the impact being overweight would have on cardiovascular risk.  Exercising by walking and making adjustments to her diet.  Congratulated on weight loss.      Time:   Today, I have spent 11 minutes with the patient with telehealth technology discussing the above problems.     Medication Adjustments/Labs and Tests Ordered: Current medicines are reviewed at length with the patient today.  Concerns regarding medicines are outlined above.   Tests Ordered: No orders of the defined types were placed in this encounter.   Medication Changes: No orders of the defined types were placed in this encounter.   Follow Up:  In Person in September as scheduled with Dr. Duke Salvia  Signed, Alver Sorrow, NP  10/18/2022 4:59 PM    [facility]

## 2022-11-01 ENCOUNTER — Encounter (HOSPITAL_BASED_OUTPATIENT_CLINIC_OR_DEPARTMENT_OTHER): Payer: Self-pay

## 2022-11-18 ENCOUNTER — Other Ambulatory Visit: Payer: Self-pay

## 2022-11-18 MED ORDER — CLOPIDOGREL BISULFATE 75 MG PO TABS: 75.0000 mg | ORAL_TABLET | Freq: Every day | ORAL | 3 refills | Status: DC

## 2022-11-18 MED ORDER — POTASSIUM CHLORIDE ER 10 MEQ PO TBCR: 10.0000 meq | EXTENDED_RELEASE_TABLET | Freq: Every day | ORAL | 3 refills | Status: DC

## 2022-11-18 NOTE — Addendum Note (Signed)
Addended by: Margaret Pyle D on: 11/18/2022 11:49 AM   Modules accepted: Orders

## 2023-02-14 ENCOUNTER — Other Ambulatory Visit: Payer: Self-pay | Admitting: Cardiovascular Disease

## 2023-02-14 NOTE — Telephone Encounter (Signed)
Rx request sent to pharmacy.  

## 2023-03-14 NOTE — H&P (View-Only) (Signed)
Cardiology Office Note:  .    Date:  03/17/2023  ID:  Darlene Rivera, DOB 1950-06-22, MRN [id number] PCP: Estevan Oaks, NP  [facility] Providers Cardiologist:  Chilton Si, MD     History of Present Illness: .    Darlene Rivera is a 72 y.o. female with a hx of of CAD with s/p remote DES to circumflex and LAD/diagonal bifurcation intervention (07/2015) with provisional stenting of LAD and angioplasty of diagonal with excellent result, hypertension, hyperlipidemia, and diabetes Mellitus type II, here for follow-up.    Her last cath 10/2020 showed progression of her RCA from 50 to 70% proximally with diffuse 50% mid RCA disease unchanged. Her LAD stent was patent, jailed diagonal had an ostial 85% stenosis. Left Circumflex stent was patent. Medical management was advised and Imdur was added. They noted that if her angina worsened her entire RCA would require stenting.   During her last visit with Dr. Katrinka Blazing she had been having calf tightness with walking and occasional chest discomfort. He felt that her angina was stable and encouraged her to use nitroglycerin as needed. He also encouraged her to increase her exercise. He noted a new RBBB. She had ABIs 07/2022 that were normal bilaterally.   At her visit 09/2022, she reported worsening dyspnea on exertion, sometimes with associated chest pain or pressure which resolved after rest or with taking nitroglycerin. We added Ranexa 500 mg BID. Blood pressure was initially elevated but improved on repeat. She had successfully lost weight with diet modification and was planning to start water aerobics. She had a virtual visit with Darlene Shields, NP 10/2022 where she still had a few episodes of chest discomfort and some exertional dyspnea. She also reported hypotensive readings (93/61, 113/68) and some lightheadedness. Ranexa was continued, and losartan was reduced to 25 mg daily.  Today, she reports worsening symptoms over the past  couple months. She complains of chest pain/tightness that comes and goes with strenuous activity or a lot of walking. This has been occurring regularly, at least 2-3 times per week. However, she is uncertain if this is related to gastric issues as she also describes some postprandial chest tightness that feels similar. For exercise she participates in a yoga class every week, and is also walking around a 1/4 mile loop a couple times during the day. She will feel out of breath with chest pain only if she is walking up an incline. She is not out of breath during her 1/4 mile walks as that is on flat ground. She complains of leg pain (although no more than normal), hip, and back pain. It is difficult for her to maintain a good posture sometimes. Next week she is scheduled for a Dexa scan. Her back pain is more manageable by sleeping with 4-5 pillows; if she lies flat she notes that she would feel a little short of breath. Additionally she complains of tingling/burning sensations attributable to neuropathy but she doesn't notice significant leg swelling. Typically she feels rested in the mornings. She is able to fall asleep easily sometimes during the day. She denies any palpitations, lightheadedness, headaches, syncope, or PND.  ROS:  Please see the history of present illness. All other systems are reviewed and negative.  (+) Exertional chest pain/tightness (+) Exertional shortness of breath (+) LE, hip, and back pain (+) Tingling/burning sensations secondary to neuropathy  Studies Reviewed: .        Risk Assessment/Calculations:  Physical Exam:    VS:  BP 124/60   Pulse 76   Ht 5\' 2"  (1.575 m)   Wt 221 lb 9.6 oz (100.5 kg)   SpO2 99%   BMI 40.53 kg/m  , BMI Body mass index is 40.53 kg/m. GENERAL:  Well appearing HEENT: Pupils equal round and reactive, fundi not visualized, oral mucosa unremarkable NECK:  No jugular venous distention, waveform within normal limits, carotid upstroke  brisk and symmetric, no bruits, no thyromegaly LUNGS:  Clear to auscultation bilaterally HEART:  RRR.  PMI not displaced or sustained,S1 and S2 within normal limits, no S3, no S4, no clicks, no rubs, no murmurs ABD:  Flat, positive bowel sounds normal in frequency in pitch, no bruits, no rebound, no guarding, no midline pulsatile mass, no hepatomegaly, no splenomegaly EXT:  2 plus pulses throughout, no edema, no cyanosis no clubbing SKIN:  No rashes no nodules NEURO:  Cranial nerves II through XII grossly intact, motor grossly intact throughout PSYCH:  Cognitively intact, oriented to person place and time  Wt Readings from Last 3 Encounters:  03/17/23 221 lb 9.6 oz (100.5 kg)  10/18/22 213 lb (96.6 kg)  09/17/22 219 lb 6.4 oz (99.5 kg)     ASSESSMENT AND PLAN: .    # CAD s/p PCI:  # Angina: She has a known 70% RCA lesion that is medically managed.   New onset of chest tightness with exertion and eating, occurring 2-3 times per week for the past couple of months. No associated shortness of breath with eating, but does occur with exertion. Currently on Plavix, Lasix, Imdur, Metoprolol, and Ranolazine for management. -Schedule left heart catheterization to assess for possible progression of known coronary artery disease. -Continue current medications. - LDL 44  # Hypertension:  BP well-controlled on current regimen.  # Possible Gastroesophageal Reflux Disease (GERD) Chest tightness associated with eating, feels like needing to belch. No current treatment for reflux. -Consider starting a proton pump inhibitor like Prilosec if heart catheterization does not show progression of coronary artery disease.  # Sleep Pattern Irregular sleep pattern, sleeping late into the night and waking early, with additional napping during the day. -No specific plan discussed, but may warrant further evaluation in the future if it becomes problematic.  Follow-up in a couple of months after heart  catheterization.     Informed Consent   Shared Decision Making/Informed Consent The risks [stroke (1 in 1000), death (1 in 1000), kidney failure [usually temporary] (1 in 500), bleeding (1 in 200), allergic reaction [possibly serious] (1 in 200)], benefits (diagnostic support and management of coronary artery disease) and alternatives of a cardiac catheterization were discussed in detail with Darlene Rivera and she is willing to proceed.     Dispo:  FU with APP in 2 months.  I,Mathew Stumpf,acting as a Neurosurgeon for Chilton Si, MD.,have documented all relevant documentation on the behalf of Chilton Si, MD,as directed by  Chilton Si, MD while in the presence of Chilton Si, MD.  I, Kruze Atchley C. Duke Salvia, MD have reviewed all documentation for this visit.  The documentation of the exam, diagnosis, procedures, and orders on 03/17/2023 are all accurate and complete.   Signed, Chilton Si, MD

## 2023-03-14 NOTE — Progress Notes (Signed)
Cardiology Office Note:  .    Date:  03/17/2023  ID:  Darlene Rivera, DOB 1950-06-22, MRN [id number] PCP: Estevan Oaks, NP  [facility] Providers Cardiologist:  Chilton Si, MD     History of Present Illness: .    Darlene Rivera is a 72 y.o. female with a hx of of CAD with s/p remote DES to circumflex and LAD/diagonal bifurcation intervention (07/2015) with provisional stenting of LAD and angioplasty of diagonal with excellent result, hypertension, hyperlipidemia, and diabetes Mellitus type II, here for follow-up.    Her last cath 10/2020 showed progression of her RCA from 50 to 70% proximally with diffuse 50% mid RCA disease unchanged. Her LAD stent was patent, jailed diagonal had an ostial 85% stenosis. Left Circumflex stent was patent. Medical management was advised and Imdur was added. They noted that if her angina worsened her entire RCA would require stenting.   During her last visit with Dr. Katrinka Blazing she had been having calf tightness with walking and occasional chest discomfort. He felt that her angina was stable and encouraged her to use nitroglycerin as needed. He also encouraged her to increase her exercise. He noted a new RBBB. She had ABIs 07/2022 that were normal bilaterally.   At her visit 09/2022, she reported worsening dyspnea on exertion, sometimes with associated chest pain or pressure which resolved after rest or with taking nitroglycerin. We added Ranexa 500 mg BID. Blood pressure was initially elevated but improved on repeat. She had successfully lost weight with diet modification and was planning to start water aerobics. She had a virtual visit with Gillian Shields, NP 10/2022 where she still had a few episodes of chest discomfort and some exertional dyspnea. She also reported hypotensive readings (93/61, 113/68) and some lightheadedness. Ranexa was continued, and losartan was reduced to 25 mg daily.  Today, she reports worsening symptoms over the past  couple months. She complains of chest pain/tightness that comes and goes with strenuous activity or a lot of walking. This has been occurring regularly, at least 2-3 times per week. However, she is uncertain if this is related to gastric issues as she also describes some postprandial chest tightness that feels similar. For exercise she participates in a yoga class every week, and is also walking around a 1/4 mile loop a couple times during the day. She will feel out of breath with chest pain only if she is walking up an incline. She is not out of breath during her 1/4 mile walks as that is on flat ground. She complains of leg pain (although no more than normal), hip, and back pain. It is difficult for her to maintain a good posture sometimes. Next week she is scheduled for a Dexa scan. Her back pain is more manageable by sleeping with 4-5 pillows; if she lies flat she notes that she would feel a little short of breath. Additionally she complains of tingling/burning sensations attributable to neuropathy but she doesn't notice significant leg swelling. Typically she feels rested in the mornings. She is able to fall asleep easily sometimes during the day. She denies any palpitations, lightheadedness, headaches, syncope, or PND.  ROS:  Please see the history of present illness. All other systems are reviewed and negative.  (+) Exertional chest pain/tightness (+) Exertional shortness of breath (+) LE, hip, and back pain (+) Tingling/burning sensations secondary to neuropathy  Studies Reviewed: .        Risk Assessment/Calculations:  Physical Exam:    VS:  BP 124/60   Pulse 76   Ht 5\' 2"  (1.575 m)   Wt 221 lb 9.6 oz (100.5 kg)   SpO2 99%   BMI 40.53 kg/m  , BMI Body mass index is 40.53 kg/m. GENERAL:  Well appearing HEENT: Pupils equal round and reactive, fundi not visualized, oral mucosa unremarkable NECK:  No jugular venous distention, waveform within normal limits, carotid upstroke  brisk and symmetric, no bruits, no thyromegaly LUNGS:  Clear to auscultation bilaterally HEART:  RRR.  PMI not displaced or sustained,S1 and S2 within normal limits, no S3, no S4, no clicks, no rubs, no murmurs ABD:  Flat, positive bowel sounds normal in frequency in pitch, no bruits, no rebound, no guarding, no midline pulsatile mass, no hepatomegaly, no splenomegaly EXT:  2 plus pulses throughout, no edema, no cyanosis no clubbing SKIN:  No rashes no nodules NEURO:  Cranial nerves II through XII grossly intact, motor grossly intact throughout PSYCH:  Cognitively intact, oriented to person place and time  Wt Readings from Last 3 Encounters:  03/17/23 221 lb 9.6 oz (100.5 kg)  10/18/22 213 lb (96.6 kg)  09/17/22 219 lb 6.4 oz (99.5 kg)     ASSESSMENT AND PLAN: .    # CAD s/p PCI:  # Angina: She has a known 70% RCA lesion that is medically managed.   New onset of chest tightness with exertion and eating, occurring 2-3 times per week for the past couple of months. No associated shortness of breath with eating, but does occur with exertion. Currently on Plavix, Lasix, Imdur, Metoprolol, and Ranolazine for management. -Schedule left heart catheterization to assess for possible progression of known coronary artery disease. -Continue current medications. - LDL 44  # Hypertension:  BP well-controlled on current regimen.  # Possible Gastroesophageal Reflux Disease (GERD) Chest tightness associated with eating, feels like needing to belch. No current treatment for reflux. -Consider starting a proton pump inhibitor like Prilosec if heart catheterization does not show progression of coronary artery disease.  # Sleep Pattern Irregular sleep pattern, sleeping late into the night and waking early, with additional napping during the day. -No specific plan discussed, but may warrant further evaluation in the future if it becomes problematic.  Follow-up in a couple of months after heart  catheterization.     Informed Consent   Shared Decision Making/Informed Consent The risks [stroke (1 in 1000), death (1 in 1000), kidney failure [usually temporary] (1 in 500), bleeding (1 in 200), allergic reaction [possibly serious] (1 in 200)], benefits (diagnostic support and management of coronary artery disease) and alternatives of a cardiac catheterization were discussed in detail with Ms. Darlene Rivera and she is willing to proceed.     Dispo:  FU with APP in 2 months.  I,Mathew Stumpf,acting as a Neurosurgeon for Chilton Si, MD.,have documented all relevant documentation on the behalf of Chilton Si, MD,as directed by  Chilton Si, MD while in the presence of Chilton Si, MD.  I, Kruze Atchley C. Duke Salvia, MD have reviewed all documentation for this visit.  The documentation of the exam, diagnosis, procedures, and orders on 03/17/2023 are all accurate and complete.   Signed, Chilton Si, MD

## 2023-03-17 ENCOUNTER — Ambulatory Visit (HOSPITAL_BASED_OUTPATIENT_CLINIC_OR_DEPARTMENT_OTHER): Payer: Medicare Other | Admitting: Cardiovascular Disease

## 2023-03-17 ENCOUNTER — Other Ambulatory Visit (HOSPITAL_BASED_OUTPATIENT_CLINIC_OR_DEPARTMENT_OTHER): Payer: Self-pay | Admitting: Cardiovascular Disease

## 2023-03-17 ENCOUNTER — Encounter (HOSPITAL_BASED_OUTPATIENT_CLINIC_OR_DEPARTMENT_OTHER): Payer: Self-pay | Admitting: Cardiovascular Disease

## 2023-03-17 VITALS — BP 124/60 | HR 76 | Ht 62.0 in | Wt 221.6 lb

## 2023-03-17 DIAGNOSIS — E118 Type 2 diabetes mellitus with unspecified complications: Secondary | ICD-10-CM | POA: Diagnosis not present

## 2023-03-17 DIAGNOSIS — I25119 Atherosclerotic heart disease of native coronary artery with unspecified angina pectoris: Secondary | ICD-10-CM | POA: Diagnosis not present

## 2023-03-17 DIAGNOSIS — I209 Angina pectoris, unspecified: Secondary | ICD-10-CM

## 2023-03-17 DIAGNOSIS — I10 Essential (primary) hypertension: Secondary | ICD-10-CM

## 2023-03-17 DIAGNOSIS — I2510 Atherosclerotic heart disease of native coronary artery without angina pectoris: Secondary | ICD-10-CM

## 2023-03-17 DIAGNOSIS — E7849 Other hyperlipidemia: Secondary | ICD-10-CM

## 2023-03-17 DIAGNOSIS — Z01812 Encounter for preprocedural laboratory examination: Secondary | ICD-10-CM

## 2023-03-17 NOTE — Patient Instructions (Addendum)
Medication Instructions:  Your physician recommends that you continue on your current medications as directed. Please refer to the Current Medication list given to you today.   Labwork: CBC/BMET TODAY  Testing/Procedures: Your physician has requested that you have a cardiac catheterization. Cardiac catheterization is used to diagnose and/or treat various heart conditions. Doctors may recommend this procedure for a number of different reasons. The most common reason is to evaluate chest pain. Chest pain can be a symptom of coronary artery disease (CAD), and cardiac catheterization can show whether plaque is narrowing or blocking your heart's arteries. This procedure is also used to evaluate the valves, as well as measure the blood flow and oxygen levels in different parts of your heart. For further information please visit [email or web address] Please follow instruction sheet, as given.  Follow-Up: 2 MONTHS WITH CAITLIN W NP   Any Other Special Instructions Will Be Listed Below (If Applicable).  You are scheduled for a Cardiac Catheterization on Wednesday, October 2 with Dr. Peter [place].  1. Please arrive at the [facility] (Main Entrance A) at [facility]: [address] [place], [place] [address] at 6:30 AM (This time is 2 hour(s) before your procedure to ensure your preparation). Free valet parking service is available. You will check in at ADMITTING. The support person will be asked to wait in the waiting room.  It is OK to have someone drop you off and come back when you are ready to be discharged.    Special note: Every effort is made to have your procedure done on time. Please understand that emergencies sometimes delay scheduled procedures.  2. Diet: Do not eat solid foods after midnight.  The patient may have clear liquids until 5am upon the day of the procedure.  3. Labs: You will need to have blood drawn TODAY do not need to be fasting.  4. Medication instructions in  preparation for your procedure:   Contrast Allergy: No  HOLD YOUR XIGDUO XR DAY OF AND FOR 48 HOURS AFTER CATH.   NO FUROSEMIDE MORNING OF PROCEDURE   On the morning of your procedure, take your CLOPIDOGREL and any morning medicines NOT listed above.  You may use sips of water.  5. Plan to go home the same day, you will only stay overnight if medically necessary. 6. Bring a current list of your medications and current insurance cards. 7. You MUST have a responsible person to drive you home. 8. Someone MUST be with you the first 24 hours after you arrive home or your discharge will be delayed. 9. Please wear clothes that are easy to get on and off and wear slip-on shoes.  Thank you for allowing [place] to care for you!   -- [facility]

## 2023-03-18 ENCOUNTER — Telehealth: Payer: Self-pay | Admitting: *Deleted

## 2023-03-18 LAB — CBC WITH DIFFERENTIAL/PLATELET
Basophils Absolute: 0.1 10*3/uL (ref 0.0–0.2)
Basos: 1 %
EOS (ABSOLUTE): 0.6 10*3/uL — ABNORMAL HIGH (ref 0.0–0.4)
Eos: 7 %
Hematocrit: 35.8 % (ref 34.0–46.6)
Hemoglobin: 11.2 g/dL (ref 11.1–15.9)
Immature Grans (Abs): 0 10*3/uL (ref 0.0–0.1)
Immature Granulocytes: 0 %
Lymphocytes Absolute: 2.7 10*3/uL (ref 0.7–3.1)
Lymphs: 31 %
MCH: 29.6 pg (ref 26.6–33.0)
MCHC: 31.3 g/dL — ABNORMAL LOW (ref 31.5–35.7)
MCV: 95 fL (ref 79–97)
Monocytes Absolute: 0.6 10*3/uL (ref 0.1–0.9)
Monocytes: 7 %
Neutrophils Absolute: 4.6 10*3/uL (ref 1.4–7.0)
Neutrophils: 54 %
Platelets: 186 10*3/uL (ref 150–450)
RBC: 3.78 x10E6/uL (ref 3.77–5.28)
RDW: 12.5 % (ref 11.7–15.4)
WBC: 8.6 10*3/uL (ref 3.4–10.8)

## 2023-03-18 LAB — BASIC METABOLIC PANEL
BUN/Creatinine Ratio: 19 (ref 12–28)
BUN: 27 mg/dL (ref 8–27)
CO2: 20 mmol/L (ref 20–29)
Calcium: 9.3 mg/dL (ref 8.7–10.3)
Chloride: 108 mmol/L — ABNORMAL HIGH (ref 96–106)
Creatinine, Ser: 1.39 mg/dL — ABNORMAL HIGH (ref 0.57–1.00)
Glucose: 107 mg/dL — ABNORMAL HIGH (ref 70–99)
Potassium: 5.3 mmol/L — ABNORMAL HIGH (ref 3.5–5.2)
Sodium: 144 mmol/L (ref 134–144)
eGFR: 40 mL/min/{1.73_m2} — ABNORMAL LOW (ref >59)

## 2023-03-18 NOTE — Telephone Encounter (Addendum)
Cardiac Catheterization scheduled at [facility] for: Wednesday March 19, 2023 11:30 AM Arrival time [facility] Main Entrance A at: 6:30 AM-pre-procedure hydration  Nothing to eat after midnight prior to procedure, clear liquids until 5 AM day of procedure.  Medication instructions: -Hold:  Losartan/Lasix/KCl-day before and day of procedure-per protocol GFR < 60-pt already taken today  Insulin pump-pt will manage  Xigduo-day of procedure  -Other usual morning medications can be taken with sips of water including aspirin 81 mg and Plavix 75 mg  Plan to go home the same day, you will only stay overnight if medically necessary.  You must have responsible adult to drive you home.  Someone must be with you the first 24 hours after you arrive home.  Reviewed procedure instructions, pre-procedure hydration, procedure time change to allow 4 hours hydration prior to procedure.

## 2023-03-19 ENCOUNTER — Ambulatory Visit (HOSPITAL_COMMUNITY)
Admission: RE | Admit: 2023-03-19 | Discharge: 2023-03-19 | Disposition: A | Discharge status: Home / Self Care | Payer: Medicare Other | Attending: Cardiology | Admitting: Cardiology

## 2023-03-19 ENCOUNTER — Other Ambulatory Visit: Payer: Self-pay

## 2023-03-19 ENCOUNTER — Encounter (HOSPITAL_COMMUNITY)
Admission: RE | Disposition: A | Discharge status: Home / Self Care | Payer: Self-pay | Source: Home / Self Care | Attending: Cardiology

## 2023-03-19 DIAGNOSIS — I2584 Coronary atherosclerosis due to calcified coronary lesion: Secondary | ICD-10-CM | POA: Insufficient documentation

## 2023-03-19 DIAGNOSIS — R0609 Other forms of dyspnea: Secondary | ICD-10-CM | POA: Diagnosis not present

## 2023-03-19 DIAGNOSIS — I209 Angina pectoris, unspecified: Secondary | ICD-10-CM

## 2023-03-19 DIAGNOSIS — I25118 Atherosclerotic heart disease of native coronary artery with other forms of angina pectoris: Secondary | ICD-10-CM | POA: Diagnosis present

## 2023-03-19 DIAGNOSIS — Z955 Presence of coronary angioplasty implant and graft: Secondary | ICD-10-CM | POA: Diagnosis not present

## 2023-03-19 HISTORY — PX: CORONARY PRESSURE/FFR STUDY: CATH118243

## 2023-03-19 HISTORY — PX: LEFT HEART CATH AND CORONARY ANGIOGRAPHY: CATH118249

## 2023-03-19 LAB — GLUCOSE, CAPILLARY
Glucose-Capillary: 82 mg/dL (ref 70–99)
Glucose-Capillary: 62 mg/dL — ABNORMAL LOW (ref 70–99)
Glucose-Capillary: 102 mg/dL — ABNORMAL HIGH (ref 70–99)

## 2023-03-19 LAB — POCT ACTIVATED CLOTTING TIME: Activated Clotting Time: 305 s

## 2023-03-19 SURGERY — LEFT HEART CATH AND CORONARY ANGIOGRAPHY
Anesthesia: LOCAL

## 2023-03-19 MED ORDER — NITROGLYCERIN 1 MG/10 ML FOR IR/CATH LAB
INTRA_ARTERIAL | Status: AC
  Filled 2023-03-19: qty 10

## 2023-03-19 MED ORDER — NITROGLYCERIN 1 MG/10 ML FOR IR/CATH LAB
INTRA_ARTERIAL | Status: DC | PRN
  Administered 2023-03-19: 200 ug via INTRACORONARY

## 2023-03-19 MED ORDER — ONDANSETRON HCL 4 MG/2ML IJ SOLN: 4.0000 mg | Freq: Four times a day (QID) | INTRAMUSCULAR | Status: DC | PRN

## 2023-03-19 MED ORDER — VERAPAMIL HCL 2.5 MG/ML IV SOLN
INTRAVENOUS | Status: AC
  Filled 2023-03-19: qty 2

## 2023-03-19 MED ORDER — MIDAZOLAM HCL 2 MG/2ML IJ SOLN
INTRAMUSCULAR | Status: AC
  Filled 2023-03-19: qty 2

## 2023-03-19 MED ORDER — VERAPAMIL HCL 2.5 MG/ML IV SOLN
INTRAVENOUS | Status: DC | PRN
  Administered 2023-03-19: 2 mg via INTRA_ARTERIAL

## 2023-03-19 MED ORDER — FENTANYL CITRATE (PF) 100 MCG/2ML IJ SOLN
INTRAMUSCULAR | Status: AC
  Filled 2023-03-19: qty 2

## 2023-03-19 MED ORDER — ASPIRIN 81 MG PO CHEW: 81.0000 mg | CHEWABLE_TABLET | ORAL | Status: DC

## 2023-03-19 MED ORDER — IOHEXOL 350 MG/ML SOLN
INTRAVENOUS | Status: DC | PRN
  Administered 2023-03-19: 56 mL

## 2023-03-19 MED ORDER — HEPARIN (PORCINE) IN NACL 1000-0.9 UT/500ML-% IV SOLN
INTRAVENOUS | Status: DC | PRN
  Administered 2023-03-19: 500 mL

## 2023-03-19 MED ORDER — SODIUM CHLORIDE 0.9% FLUSH: 3.0000 mL | Freq: Two times a day (BID) | INTRAVENOUS | Status: DC

## 2023-03-19 MED ORDER — SODIUM CHLORIDE 0.9 % WEIGHT BASED INFUSION
3.0000 mL/kg/h | INTRAVENOUS | Status: AC
  Administered 2023-03-19: 3 mL/kg/h via INTRAVENOUS

## 2023-03-19 MED ORDER — ACETAMINOPHEN 325 MG PO TABS: 650.0000 mg | ORAL_TABLET | ORAL | Status: DC | PRN

## 2023-03-19 MED ORDER — LIDOCAINE HCL (PF) 1 % IJ SOLN
INTRAMUSCULAR | Status: AC
  Filled 2023-03-19: qty 30

## 2023-03-19 MED ORDER — SODIUM CHLORIDE 0.9 % WEIGHT BASED INFUSION: 1.0000 mL/kg/h | INTRAVENOUS | Status: DC

## 2023-03-19 MED ORDER — HEPARIN SODIUM (PORCINE) 1000 UNIT/ML IJ SOLN
INTRAMUSCULAR | Status: AC
  Filled 2023-03-19: qty 10

## 2023-03-19 MED ORDER — HYDRALAZINE HCL 20 MG/ML IJ SOLN: 10.0000 mg | INTRAMUSCULAR | Status: DC | PRN

## 2023-03-19 MED ORDER — HEPARIN SODIUM (PORCINE) 1000 UNIT/ML IJ SOLN
INTRAMUSCULAR | Status: DC | PRN
  Administered 2023-03-19 (×2): 5000 [IU] via INTRAVENOUS

## 2023-03-19 MED ORDER — SODIUM CHLORIDE 0.9 % IV SOLN: 250.0000 mL | INTRAVENOUS | Status: DC | PRN

## 2023-03-19 MED ORDER — SODIUM CHLORIDE 0.9% FLUSH: 3.0000 mL | INTRAVENOUS | Status: DC | PRN

## 2023-03-19 MED ORDER — LIDOCAINE HCL (PF) 1 % IJ SOLN
INTRAMUSCULAR | Status: DC | PRN
  Administered 2023-03-19: 2 mL

## 2023-03-19 MED ORDER — VERAPAMIL HCL 2.5 MG/ML IV SOLN
INTRAVENOUS | Status: DC | PRN
  Administered 2023-03-19: 10 mL via INTRA_ARTERIAL

## 2023-03-19 MED ORDER — MIDAZOLAM HCL 2 MG/2ML IJ SOLN
INTRAMUSCULAR | Status: DC | PRN
  Administered 2023-03-19: 1 mg via INTRAVENOUS

## 2023-03-19 MED ORDER — FENTANYL CITRATE (PF) 100 MCG/2ML IJ SOLN
INTRAMUSCULAR | Status: DC | PRN
  Administered 2023-03-19: 25 ug via INTRAVENOUS

## 2023-03-19 SURGICAL SUPPLY — 11 items
CATH 5FR JL3.5 JR4 ANG PIG MP (CATHETERS) IMPLANT
CATH LAUNCHER 5F JR4 (CATHETERS) IMPLANT
DEVICE RAD COMP TR BAND LRG (VASCULAR PRODUCTS) IMPLANT
GLIDESHEATH SLEND SS 6F .021 (SHEATH) IMPLANT
GUIDEWIRE INQWIRE 1.5J.035X260 (WIRE) IMPLANT
GUIDEWIRE PRESSURE X 175 (WIRE) IMPLANT
INQWIRE 1.5J .035X260CM (WIRE) ×1
KIT ESSENTIALS PG (KITS) IMPLANT
PACK CARDIAC CATHETERIZATION (CUSTOM PROCEDURE TRAY) ×1 IMPLANT
SET ATX-X65L (MISCELLANEOUS) IMPLANT
SHEATH PROBE COVER 6X72 (BAG) IMPLANT

## 2023-03-19 NOTE — Discharge Instructions (Signed)

## 2023-03-19 NOTE — Interval H&P Note (Signed)
History and Physical Interval Note:  03/19/2023 10:48 AM  Darlene Rivera  has presented today for surgery, with the diagnosis of angina.  The various methods of treatment have been discussed with the patient and family. After consideration of risks, benefits and other options for treatment, the patient has consented to  Procedure(s): LEFT HEART CATH AND CORONARY ANGIOGRAPHY (N/A) as a surgical intervention.  The patient's history has been reviewed, patient examined, no change in status, stable for surgery.  I have reviewed the patient's chart and labs.  Questions were answered to the patient's satisfaction.   Cath Lab Visit (complete for each Cath Lab visit)  Clinical Evaluation Leading to the Procedure:   ACS: No.  Non-ACS:    Anginal Classification: CCS III  Anti-ischemic medical therapy: Maximal Therapy (2 or more classes of medications)  Non-Invasive Test Results: No non-invasive testing performed  Prior CABG: No previous CABG        [facility] 03/19/2023 10:48 AM

## 2023-03-19 NOTE — Progress Notes (Signed)
Right radial TR band off at 1345, gauze dressing applied. Right radial site, clean, dry, and intact. Patient walked to the bathroom without difficulties.

## 2023-03-20 ENCOUNTER — Encounter (HOSPITAL_COMMUNITY): Payer: Self-pay | Admitting: Cardiology

## 2023-03-21 ENCOUNTER — Telehealth (HOSPITAL_BASED_OUTPATIENT_CLINIC_OR_DEPARTMENT_OTHER): Payer: Self-pay

## 2023-03-21 DIAGNOSIS — E875 Hyperkalemia: Secondary | ICD-10-CM

## 2023-03-21 DIAGNOSIS — I10 Essential (primary) hypertension: Secondary | ICD-10-CM

## 2023-03-21 NOTE — Telephone Encounter (Addendum)
Results called to patient who verbalizes understanding! Labs ordered.   ----- Message from Alver Sorrow sent at 03/20/2023  8:13 PM EDT ----- CBC with no evidence of anemia nor infection.  Kidney function decreased from previous. Potassium mildly elevated. Recommend increase oral hydration and repeat BMP Monday for monitoring.

## 2023-03-24 LAB — BASIC METABOLIC PANEL
BUN/Creatinine Ratio: 19 (ref 12–28)
BUN: 27 mg/dL (ref 8–27)
CO2: 17 mmol/L — ABNORMAL LOW (ref 20–29)
Calcium: 8.8 mg/dL (ref 8.7–10.3)
Chloride: 109 mmol/L — ABNORMAL HIGH (ref 96–106)
Creatinine, Ser: 1.42 mg/dL — ABNORMAL HIGH (ref 0.57–1.00)
Glucose: 109 mg/dL — ABNORMAL HIGH (ref 70–99)
Potassium: 4.3 mmol/L (ref 3.5–5.2)
Sodium: 143 mmol/L (ref 134–144)
eGFR: 39 mL/min/{1.73_m2} — ABNORMAL LOW (ref >59)

## 2023-03-25 ENCOUNTER — Telehealth (HOSPITAL_BASED_OUTPATIENT_CLINIC_OR_DEPARTMENT_OTHER): Payer: Self-pay

## 2023-03-25 NOTE — Telephone Encounter (Addendum)
Left message for patient to call back,   Labs reviewed in via [facility], creatinine records 0.91, pre CW no need to request records, continue with plan to reduce losartan with repeat labs.     ----- Message from Alver Sorrow sent at 03/25/2023  7:41 AM EDT ----- Potassium has normalized.  Kidney function still not improved to baseline. Somewhat difficult to determine baseline as there is a 2 year gap in labs from 10/2020 to 02/2023 (PCP [facility], unable to view in [place] or Care Everywhere).  I would recommend reduce Losartan to 25mg  daily with repeat BMP In 2 weeks, increase oral hydration. Additionally, please request labs from PCP.

## 2023-03-26 ENCOUNTER — Telehealth: Payer: Self-pay | Admitting: Family

## 2023-03-26 DIAGNOSIS — N289 Disorder of kidney and ureter, unspecified: Secondary | ICD-10-CM

## 2023-03-26 DIAGNOSIS — I10 Essential (primary) hypertension: Secondary | ICD-10-CM

## 2023-03-26 MED ORDER — LOSARTAN POTASSIUM 25 MG PO TABS: 25.0000 mg | ORAL_TABLET | Freq: Every day | ORAL | 3 refills | Status: DC

## 2023-03-26 NOTE — Telephone Encounter (Signed)
Alver Sorrow, NP 03/25/2023  7:41 AM EDT     Potassium has normalized.  Kidney function still not improved to baseline. Somewhat difficult to determine baseline as there is a 2 year gap in labs from 10/2020 to 02/2023 (PCP [facility], unable to view in [place] or Care Everywhere).   I would recommend reduce Losartan to 25mg  daily with repeat BMP In 2 weeks, increase oral hydration. Additionally, please request labs from PCP.   Advised patient, verbalized understanding.

## 2023-03-26 NOTE — Addendum Note (Signed)
Addended by: Regis Bill B on: 03/26/2023 11:03 AM   Modules accepted: Orders

## 2023-03-26 NOTE — Telephone Encounter (Signed)
Patient is calling back to receive her results.

## 2023-06-10 ENCOUNTER — Encounter (HOSPITAL_BASED_OUTPATIENT_CLINIC_OR_DEPARTMENT_OTHER): Payer: Self-pay | Admitting: Family

## 2023-06-10 ENCOUNTER — Ambulatory Visit (HOSPITAL_BASED_OUTPATIENT_CLINIC_OR_DEPARTMENT_OTHER): Payer: Medicare Other | Admitting: Family

## 2023-06-10 VITALS — BP 126/64 | HR 88 | Ht 62.0 in | Wt 215.1 lb

## 2023-06-10 DIAGNOSIS — I25118 Atherosclerotic heart disease of native coronary artery with other forms of angina pectoris: Secondary | ICD-10-CM

## 2023-06-10 DIAGNOSIS — E785 Hyperlipidemia, unspecified: Secondary | ICD-10-CM | POA: Diagnosis not present

## 2023-06-10 DIAGNOSIS — I10 Essential (primary) hypertension: Secondary | ICD-10-CM | POA: Diagnosis not present

## 2023-06-10 MED ORDER — NITROGLYCERIN 0.4 MG SL SUBL: 0.4000 mg | SUBLINGUAL_TABLET | SUBLINGUAL | 6 refills | Status: DC | PRN

## 2023-06-10 NOTE — Patient Instructions (Addendum)
Medication Instructions:  Your physician recommends that you continue on your current medications as directed. Please refer to the Current Medication list given to you today.  *If you need a refill on your cardiac medications before your next appointment, please call your pharmacy*   Follow-Up: At [facility], you and your health needs are our priority.  As part of our continuing mission to provide you with exceptional heart care, we have created designated Provider Care Teams.  These Care Teams include your primary Cardiologist (physician) and Advanced Practice Providers (APPs -  Physician Assistants and Nurse Practitioners) who all work together to provide you with the care you need, when you need it.  We recommend signing up for the patient portal called "MyChart".  Sign up information is provided on this After Visit Summary.  MyChart is used to connect with patients for Virtual Visits (Telemedicine).  Patients are able to view lab/test results, encounter notes, upcoming appointments, etc.  Non-urgent messages can be sent to your provider as well.   To learn more about what you can do with MyChart, go to [email or web address].    Your next appointment:   4 month(s)  Provider:   Chilton Si, MD    Other Instructions  If you want, could move Losartan or Imdur to the evening instead of the morning.

## 2023-06-10 NOTE — Progress Notes (Signed)
Cardiology Office Note:  .   Date:  06/10/2023  ID:  Darlene Rivera, DOB 02-21-1951, MRN [id number] PCP: Estevan Oaks, NP  [facility] Providers Cardiologist:  Chilton Si, MD    History of Present Illness: .   Darlene Rivera is a 72 y.o. female with CAD (remote DES-Cx, 07/2015 LAD diagonal bifurcation intervention with provisional stenting of LAD and angioplasty of diagonal), HTN, HLD, DM2  LHC  10/2020 showed progression of her RCA from 50 to 70% proximally with diffuse 50% mid RCA disease unchanged. Her LAD stent was patent, the jailed Diagonal had an ostial 85% stenosis. Left Circumflex stent was patent. Medical management was advised and Imdur was added.  Seen 08/2021 by Dr. Katrinka Blazing with new RBBB and stable anginal symptoms recommended for PRN nitroglycerin. Due to calf tightness with walking with subsequent ABI 07/2022 unremarkable.    Established with Dr. Duke Salvia 09/17/22 after Dr. Michaelle Copas retirement. Due to exertional dyspnea,  Ranexa was initiated.  Plavix and metoprolol were continued.    Last seen 03/17/23 with worsening chest pain/tightness for a couple months. Underwent LHC 03/19/2023 revealing continued patency of stents in the proximal to mid LAD and proximal left circumflex.  Borderline disease in the proximal and mid RCA unchanged from cath 10/2020 which was not hemodynamically significant recommended for continued medical therapy.  Presents today for follow-up. Feeling well since last seen. Watching an 93 month old which keeps her active. Additional exercise with chair yoga and walking. Reports lightheadedness but no near-syncope, syncope in the morning after medications.  Reports BP at home routinely 120s over 80s and no hypotensive readings.  She notes episodes of chest pain 2-3 times per week with more than usual activity that often resolves with rest but occasionally requires 1 nitroglycerin.  This is overall stable from previous.  We reviewed her most recent  cardiac catheterization.  ROS: Please see the history of present illness.    All other systems reviewed and are negative.   Studies Reviewed: .        Cardiac Studies & Procedures   CARDIAC CATHETERIZATION  CARDIAC CATHETERIZATION 03/19/2023  Narrative Continued excellent patency of stents in the proximal to mid LAD and proximal LCx Borderline disease in the proximal and mid RCA. This has not changed since prior cath in May 2022. RFR is 0.90 across both lesion suggesting this is not hemodynamically significant. Mildly elevated LVEDP 22 mm Hg post hydration  Plan: recommend continued medical therapy. For future cardiac cath procedures I would consider alternative access given small radial artery size.  Findings Coronary Findings Diagnostic  Dominance: Right  Left Anterior Descending The vessel exhibits minimal luminal irregularities. Non-stenotic Prox LAD lesion was previously treated.  First Diagonal Branch Ost 1st Diag lesion is 85% stenosed. The lesion is discrete.  Left Circumflex The vessel exhibits minimal luminal irregularities. Non-stenotic Prox Cx lesion was previously treated.  Right Coronary Artery The vessel exhibits minimal luminal irregularities. Prox RCA lesion is 70% stenosed. The lesion is severely calcified. Pressure gradient was performed on the lesion. RFR: 0.9. Prox RCA to Mid RCA lesion is 50% stenosed. The lesion is segmental. The lesion is moderately calcified. Pressure gradient was performed on the lesion. RFR: 0.9. Dist RCA lesion is 30% stenosed.  Right Posterior Descending Artery The vessel exhibits minimal luminal irregularities.  Intervention  No interventions have been documented.   CARDIAC CATHETERIZATION  CARDIAC CATHETERIZATION 11/07/2020  Narrative  There is progression of disease in the proximal RCA from 50%  to 70%.  There is diffuse segmental 50% stenosis in the mid right coronary unchanged.  Short left main  Widely patent  LAD including the mid vessel stent.  The jailed diagonal contains ostial 85% narrowing.  Patent stent in the proximal circumflex with generalized luminal irregularities noted.  Normal LVEDP.  RECOMMENDATIONS:   Continue aspirin and Plavix.  Progress anti-ischemic therapy by adding long-acting nitrates, isosorbide mononitrate 30 mg/day.  Aggressive risk factor modification.  If angina interferes with quality of life, consider stenting the entire proximal to distal RCA segment.  Findings Coronary Findings Diagnostic  Dominance: Right  Left Anterior Descending The vessel exhibits minimal luminal irregularities. Non-stenotic Prox LAD lesion was previously treated.  First Diagonal Branch Ost 1st Diag lesion is 85% stenosed. The lesion is discrete.  Left Circumflex The vessel exhibits minimal luminal irregularities. Non-stenotic Prox Cx lesion was previously treated.  Right Coronary Artery The vessel exhibits minimal luminal irregularities. Prox RCA lesion is 75% stenosed. Prox RCA to Mid RCA lesion is 45% stenosed. The lesion is segmental. The lesion is moderately calcified. Dist RCA lesion is 30% stenosed.  Right Posterior Descending Artery The vessel exhibits minimal luminal irregularities.  Intervention  No interventions have been documented.   STRESS TESTS  MYOCARDIAL PERFUSION IMAGING 07/14/2015  Narrative  Nuclear stress EF: 79%.  The left ventricular ejection fraction is hyperdynamic (>65%).  There was no ST segment deviation noted during stress.  No T wave inversion was noted during stress.  Defect 1: There is a small defect of moderate severity present in the mid inferolateral location. Consistent with prior infarct in the left circumflex artery territory.  Defect 2: There is a small defect of moderate severity present in the apical anterior and apex location. Consistent with ischemia in the left anterior descending artery territory.  This is a low  risk study.              Risk Assessment/Calculations:             Physical Exam:   VS:  BP 126/64   Pulse 88   Ht 5\' 2"  (1.575 m)   Wt 215 lb 1.6 oz (97.6 kg)   SpO2 98%   BMI 39.34 kg/m    Wt Readings from Last 3 Encounters:  06/10/23 215 lb 1.6 oz (97.6 kg)  03/19/23 221 lb (100.2 kg)  03/17/23 221 lb 9.6 oz (100.5 kg)    GEN: Well nourished, well developed in no acute distress NECK: No JVD; No carotid bruits CARDIAC: RRR, no murmurs, rubs, gallops RESPIRATORY:  Clear to auscultation without rales, wheezing or rhonchi  ABDOMEN: Soft, non-tender, non-distended EXTREMITIES:  No edema; No deformity   ASSESSMENT AND PLAN: .    CAD s/p PCI -  Known 70% RCA lesion again recommended for medical management at [facility] 03/19/23. Chest pain twice per week with more than usual exertion most often resolved by rest and rarely requiring PRN nitroglycerin. Overall stable from previous. Refill PRN nitroglycerin as hers is out of date. Defer increasing Ranexa due to morning lightheadedness and stable symptoms, but could be considered in the future if symptoms progress. GDMT includes Plavix, ranexa, Imdur, Metoprolol, Rosuvastatin. Recommend aiming for 150 minutes of moderate intensity activity per week and following a heart healthy diet.     Hypertension- BP well controlled. Continue current antihypertensive regimen.  Due to some morning lightheadedness after medications we discussed she could move Losartan or Imdur to evening dosing if she prefers.    Hyperlipidemia, LDL goal  less than 70 - 07/2022 LDL 44. continue rosuvastatin 20 mg daily.   Weight management -  Congratulated on 6 lb weight loss since clinic visit 2 mos ago. Participating in chair yoga and walking. Weight loss via diet and exercise encouraged. Discussed the impact being overweight would have on cardiovascular risk.           Dispo: follow up in 4 mos  Signed, Alver Sorrow, NP

## 2023-07-02 ENCOUNTER — Encounter (HOSPITAL_BASED_OUTPATIENT_CLINIC_OR_DEPARTMENT_OTHER): Payer: Self-pay | Admitting: Emergency Medicine

## 2023-07-02 ENCOUNTER — Other Ambulatory Visit: Payer: Self-pay

## 2023-07-02 ENCOUNTER — Emergency Department (HOSPITAL_BASED_OUTPATIENT_CLINIC_OR_DEPARTMENT_OTHER)
Admission: EM | Admit: 2023-07-02 | Discharge: 2023-07-02 | Disposition: A | Discharge status: Home / Self Care | Payer: Medicare Other | Attending: Emergency Medicine | Admitting: Emergency Medicine

## 2023-07-02 ENCOUNTER — Emergency Department (HOSPITAL_BASED_OUTPATIENT_CLINIC_OR_DEPARTMENT_OTHER): Payer: Medicare Other

## 2023-07-02 DIAGNOSIS — M546 Pain in thoracic spine: Secondary | ICD-10-CM | POA: Diagnosis present

## 2023-07-02 DIAGNOSIS — Z7902 Long term (current) use of antithrombotics/antiplatelets: Secondary | ICD-10-CM | POA: Insufficient documentation

## 2023-07-02 DIAGNOSIS — W000XXA Fall on same level due to ice and snow, initial encounter: Secondary | ICD-10-CM | POA: Diagnosis not present

## 2023-07-02 DIAGNOSIS — Y9389 Activity, other specified: Secondary | ICD-10-CM | POA: Diagnosis not present

## 2023-07-02 DIAGNOSIS — Z79899 Other long term (current) drug therapy: Secondary | ICD-10-CM | POA: Diagnosis not present

## 2023-07-02 DIAGNOSIS — I10 Essential (primary) hypertension: Secondary | ICD-10-CM | POA: Diagnosis not present

## 2023-07-02 DIAGNOSIS — E119 Type 2 diabetes mellitus without complications: Secondary | ICD-10-CM | POA: Diagnosis not present

## 2023-07-02 DIAGNOSIS — Z794 Long term (current) use of insulin: Secondary | ICD-10-CM | POA: Insufficient documentation

## 2023-07-02 DIAGNOSIS — I2510 Atherosclerotic heart disease of native coronary artery without angina pectoris: Secondary | ICD-10-CM | POA: Diagnosis not present

## 2023-07-02 DIAGNOSIS — W19XXXA Unspecified fall, initial encounter: Secondary | ICD-10-CM

## 2023-07-02 MED ORDER — HYDROCODONE-ACETAMINOPHEN 5-325 MG PO TABS
1.0000 | ORAL_TABLET | Freq: Once | ORAL | Status: AC
  Administered 2023-07-02: 1 via ORAL
  Filled 2023-07-02: qty 1

## 2023-07-02 MED ORDER — LIDOCAINE 5 % EX PTCH: 1.0000 | MEDICATED_PATCH | CUTANEOUS | 0 refills | Status: DC

## 2023-07-02 MED ORDER — NAPROXEN 500 MG PO TABS: 500.0000 mg | ORAL_TABLET | Freq: Two times a day (BID) | ORAL | 0 refills | Status: AC

## 2023-07-02 MED ORDER — METHOCARBAMOL 500 MG PO TABS
500.0000 mg | ORAL_TABLET | Freq: Once | ORAL | Status: AC
  Administered 2023-07-02: 500 mg via ORAL
  Filled 2023-07-02: qty 1

## 2023-07-02 MED ORDER — LIDOCAINE 5 % EX PTCH
1.0000 | MEDICATED_PATCH | CUTANEOUS | Status: DC
  Administered 2023-07-02: 1 via TRANSDERMAL
  Filled 2023-07-02: qty 1

## 2023-07-02 MED ORDER — METHOCARBAMOL 500 MG PO TABS: 500.0000 mg | ORAL_TABLET | Freq: Every day | ORAL | 0 refills | Status: AC

## 2023-07-02 NOTE — Discharge Instructions (Signed)
 You are seen in the emergency room today for back pain.  I have sent naproxen  to your pharmacy please take twice daily.  I would also recommend taking Tylenol  4 times a day.  I sent lidocaine  patches to your pharmacy which you can apply to area of pain every 24 hours.  I have also sent Robaxin  which is a muscle relaxer.  Please do not take this medication if you are driving or operating heavy machinery.  If you have breakthrough pain you can use ice and heat.  Please follow-up with your primary care doctor.  As discussed if your pain is getting worse please return to emergency room.

## 2023-07-02 NOTE — ED Notes (Signed)

## 2023-07-02 NOTE — ED Triage Notes (Signed)
 Pt fell while cleaning her car off, landing on back; c/o middle back pain; takes Plavix ; denies hitting head

## 2023-07-02 NOTE — ED Provider Notes (Signed)
[facility] Provider Note   CSN: [id number] Arrival date & time: 07/02/23  1750     History  Chief Complaint  Patient presents with   Darlene Rivera is a 73 y.o. female patient with past medical history of diabetes, hypertension, GERD, rheumatoid arthritis, obesity presenting to the emergency room today after a fall.  Patient reports that she was cleaning off her car when she tripped and fell landing on her back.  She reports she slipped on ice.  Denies hitting her head.  Has no headache or neck pain.  Primarily complaining of mid back pain worse when she takes deep breath or with movement.  She is not tried anything for her pain.  Denies chest pain, shortness of breath abdominal pain nausea vomiting diarrhea or additional complaints.   Fall       Home Medications Prior to Admission medications   Medication Sig Start Date End Date Taking? Authorizing Provider  allopurinol (ZYLOPRIM) 300 MG tablet Take 150 mg by mouth daily.  06/16/17   [provider]  b complex vitamins capsule Take 1 capsule by mouth daily.    [provider]  Calcium Carbonate-Vitamin D (CALCIUM-VITAMIN D) 500-200 MG-UNIT per tablet Take 1 tablet by mouth 2 (two) times daily.     [provider]  cholecalciferol (VITAMIN D) 1000 UNITS tablet Take 1,000 Units by mouth daily.    [provider]  clopidogrel (PLAVIX) 75 MG tablet Take 1 tablet (75 mg total) by mouth daily. 11/18/22   Chilton Si, MD  Continuous Blood Gluc Sensor (DEXCOM G7 SENSOR) MISC Inject into the skin once a week. 08/24/22   [provider]  Cyanocobalamin (VITAMIN B-12 PO) Take 1 tablet by mouth daily.    [provider]  ferrous fumarate (HEMOCYTE - 106 MG FE) 325 (106 FE) MG TABS tablet Take 1 tablet by mouth daily before lunch.    [provider]  furosemide (LASIX) 40 MG tablet Take 40 mg by mouth daily.    [provider]  HUMALOG 100 UNIT/ML injection See admin instructions. Via Insulin Pump 02/20/23   [provider]  Insulin Human (INSULIN PUMP) SOLN Inject into the skin. 03/18/23   Chilton Si, MD  isosorbide mononitrate (IMDUR) 120 MG 24 hr tablet TAKE 1 TABLET BY MOUTH EVERY DAY 02/14/23   Chilton Si, MD  latanoprost (XALATAN) 0.005 % ophthalmic solution Place 1 drop into the right eye at bedtime. 03/07/23   [provider]  loratadine (CLARITIN) 10 MG tablet Take 10 mg by mouth daily.    [provider]  losartan (COZAAR) 25 MG tablet Take 1 tablet (25 mg total) by mouth daily. 03/26/23   Alver Sorrow, NP  metoprolol tartrate (LOPRESSOR) 50 MG tablet Take 1 tablet (50 mg total) by mouth 2 (two) times daily. 09/10/22   Chilton Si, MD  Multiple Vitamin (MULTIVITAMIN) capsule Take 1 capsule by mouth daily.    [provider]  nitroGLYCERIN (NITROSTAT) 0.4 MG SL tablet Place 1 tablet (0.4 mg total) under the tongue every 5 (five) minutes as needed for chest pain. 06/10/23   Alver Sorrow, NP  Omega-3 Fatty Acids (FISH OIL) 1000 MG CAPS Take 1,000 mg by mouth daily.    [provider]  potassium chloride (KLOR-CON) 10 MEQ tablet Take 1 tablet (10 mEq total) by mouth daily. 11/18/22   Chilton Si, MD  ranolazine (RANEXA) 500 MG 12  hr tablet Take 1 tablet (500 mg total) by mouth 2 (two) times daily. 09/17/22   Chilton Si, MD  rosuvastatin (CRESTOR) 20 MG tablet Take 1 tablet (20 mg total) by mouth daily. 10/07/22   Chilton Si, MD  vitamin C (ASCORBIC ACID) 500 MG tablet Take 500 mg by mouth daily.    [provider]  XIGDUO XR 5-500 MG TB24 Take 1 tablet by mouth 2 (two) times daily. 07/12/19   [provider]      Allergies    Sulfa antibiotics and Trulicity [dulaglutide]    Review of Systems   Review of Systems  Musculoskeletal:  Positive for back pain.    Physical Exam Updated Vital Signs BP  (!) 198/81   Pulse 99   Temp 97.7 F (36.5 C)   Resp 18   Ht 5\' 2"  (1.575 m)   Wt 97.5 kg   SpO2 100%   BMI 39.32 kg/m  Physical Exam Vitals and nursing note reviewed.  Constitutional:      General: She is not in acute distress.    Appearance: She is not toxic-appearing.  HENT:     Head: Normocephalic and atraumatic.  Eyes:     General: No scleral icterus.    Conjunctiva/sclera: Conjunctivae normal.  Cardiovascular:     Rate and Rhythm: Normal rate and regular rhythm.     Pulses: Normal pulses.     Heart sounds: Normal heart sounds.  Pulmonary:     Effort: Pulmonary effort is normal. No respiratory distress.     Breath sounds: Normal breath sounds.  Abdominal:     General: Abdomen is flat. Bowel sounds are normal.     Palpations: Abdomen is soft.     Tenderness: There is no abdominal tenderness.  Musculoskeletal:     Right lower leg: No edema.     Left lower leg: No edema.  Skin:    General: Skin is warm and dry.     Findings: No lesion.  Neurological:     General: No focal deficit present.     Mental Status: She is alert and oriented to person, place, and time. Mental status is at baseline.     ED Results / Procedures / Treatments   Labs (all labs ordered are listed, but only abnormal results are displayed) Labs Reviewed - No data to display  EKG None  Radiology DG Thoracic Spine 2 View Result Date: 07/02/2023 CLINICAL DATA:  Middle back pain after fall. EXAM: THORACIC SPINE 2 VIEWS COMPARISON:  None Available. FINDINGS: There is no evidence of thoracic spine fracture. Alignment is normal. Multilevel degenerative changes are noted in the lower thoracic spine. IMPRESSION: Multilevel degenerative changes.  No acute abnormality. Electronically Signed   By: Lupita Raider M.D.   On: 07/02/2023 19:28   DG Chest 2 View Result Date: 07/02/2023 CLINICAL DATA:  Back pain after fall. EXAM: CHEST - 2 VIEW COMPARISON:  February 09, 2009. FINDINGS: The heart size and  mediastinal contours are within normal limits. Both lungs are clear. The visualized skeletal structures are unremarkable. IMPRESSION: No active cardiopulmonary disease. Electronically Signed   By: Lupita Raider M.D.   On: 07/02/2023 19:27    Procedures Procedures    Medications Ordered in ED Medications  lidocaine (LIDODERM) 5 % 1 patch (1 patch Transdermal Patch Applied 07/02/23 1935)  HYDROcodone-acetaminophen (NORCO/VICODIN) 5-325 MG per tablet 1 tablet (1 tablet Oral Given 07/02/23 1932)  methocarbamol (ROBAXIN) tablet 500 mg (500 mg Oral Given 07/02/23  2005)    ED Course/ Medical Decision Making/ A&P                                 Medical Decision Making Amount and/or Complexity of Data Reviewed Radiology: ordered.  Risk Prescription drug management.   [name] 73 y.o. presented today for back pain. Working Ddx: MSK in nature, fracture, epidural hematoma/abscess, cauda equina syndrome, spinal stenosis, spinal malignancy, discitis, spinal infection, spondylitises/ spondylosis, conus medullaris, DDD of the back.  R/o DDx: Cauda equina syndrome and additional dx are less likely than current impression due to history of present illness, physical exam, labs/imaging findings. No focal neurological deficits, no loss of bowel or bladder control.  Denies fever, night sweats, weight loss, h/o cancer, IVDU.  PMHX: Diabetes, hypertension, GERD, hyperlipidemia, rheumatoid arthritis, coronary artery disease    Review of prior external notes: Reviewed recent office visit 06/10/2023  Labs: None ordered at this time  Imaging: Chest and thoracic spine.  No acute abnormality on imaging.  Offered CT imaging however patient reports symptoms improved after medication.  She was able to ambulate with mild discomfort and felt comfortable without further imaging.  Problem List / ED Course / Critical interventions / Medication management  Reporting to emergency room after fall.  Patient  does take Plavix.  She had a fall in which she did not hit her head and did not lose consciousness.  She is not complaining of any neck pain.  She is able to move all extremities without difficulty.  She has reassuring neurological exam with no deficits.  She does have midthoracic back pain.  No midline tenderness or step-off fracture.  Pain is over the paravertebral musculature.  No red flag symptoms associated with her back pain.  Patient able to ambulate with steady gait.  Attending doctor Dr. Particia Nearing saw patient and agreed to plan. I ordered medication including Norco, lidocaine patch, Robaxin Reevaluation of the patient after these medicines showed that the patient improved Patients vitals assessed. Upon arrival patient is hemodynamically stable.  I have reviewed the patients home medicines and have made adjustments as needed  Consult: None   Plan:  F/u w/ PCP in 2-3d to ensure resolution of sx.  RICE protocol and pain medicine discussed with patient.  Patient was given return precautions. Patient stable for discharge at this time.  Patient educated on sx/dx and verbalized understanding of plan. Return to ER with new or worsening sx.           Final Clinical Impression(s) / ED Diagnoses Final diagnoses:  Fall, initial encounter  Acute bilateral thoracic back pain    Rx / DC Orders ED Discharge Orders          Ordered    methocarbamol (ROBAXIN) 500 MG tablet  Daily        07/02/23 2051    lidocaine (LIDODERM) 5 %  Every 24 hours        07/02/23 2051    naproxen (NAPROSYN) 500 MG tablet  2 times daily        07/02/23 2051              Ayman Brull, Horald Chestnut, PA-C 07/02/23 2322    Jacalyn Lefevre, MD 07/11/23 1511

## 2023-08-08 ENCOUNTER — Other Ambulatory Visit: Payer: Self-pay | Admitting: Cardiovascular Disease

## 2023-08-14 ENCOUNTER — Other Ambulatory Visit (HOSPITAL_BASED_OUTPATIENT_CLINIC_OR_DEPARTMENT_OTHER): Payer: Self-pay | Admitting: Cardiovascular Disease

## 2023-09-27 ENCOUNTER — Other Ambulatory Visit: Payer: Self-pay | Admitting: Cardiovascular Disease

## 2023-10-08 NOTE — Progress Notes (Unsigned)
  Cardiology Office Note:  .   Date:  10/08/2023  ID:  Darlene Rivera, DOB 21-Apr-1951, MRN [id number] PCP: Carolyn Cisco, NP  [facility] Providers Cardiologist:  Maudine Sos, MD { Click to update primary MD,subspecialty MD or APP then REFRESH:1}   History of Present Illness: .   Darlene Rivera is a 73 y.o. female ***  Discussed the use of AI scribe software for clinical note transcription with the patient, who gave verbal consent to proceed.  History of Present Illness     ROS:  As per HPI  Studies Reviewed: .        *** Risk Assessment/Calculations:   {Does this patient have ATRIAL FIBRILLATION?:[id number]} No BP recorded.  {Refresh Note OR Click here to enter BP  :1}***       Physical Exam:   VS:  There were no vitals taken for this visit. , BMI There is no height or weight on file to calculate BMI. GENERAL:  Well appearing HEENT: Pupils equal round and reactive, fundi not visualized, oral mucosa unremarkable NECK:  No jugular venous distention, waveform within normal limits, carotid upstroke brisk and symmetric, no bruits, no thyromegaly LYMPHATICS:  No cervical adenopathy LUNGS:  Clear to auscultation bilaterally HEART:  RRR.  PMI not displaced or sustained,S1 and S2 within normal limits, no S3, no S4, no clicks, no rubs, *** murmurs ABD:  Flat, positive bowel sounds normal in frequency in pitch, no bruits, no rebound, no guarding, no midline pulsatile mass, no hepatomegaly, no splenomegaly EXT:  2 plus pulses throughout, no edema, no cyanosis no clubbing SKIN:  No rashes no nodules NEURO:  Cranial nerves II through XII grossly intact, motor grossly intact throughout PSYCH:  Cognitively intact, oriented to person place and time   ASSESSMENT AND PLAN: .   *** Assessment and Plan Assessment & Plan         {Are you ordering a CV Procedure (e.g. stress test, cath, DCCV, TEE, etc)?   Press F2        :[id number]}  Dispo:  ***  Signed, Maudine Sos, MD

## 2023-10-09 ENCOUNTER — Encounter (HOSPITAL_BASED_OUTPATIENT_CLINIC_OR_DEPARTMENT_OTHER): Payer: Self-pay | Admitting: Cardiovascular Disease

## 2023-10-09 ENCOUNTER — Ambulatory Visit (HOSPITAL_BASED_OUTPATIENT_CLINIC_OR_DEPARTMENT_OTHER): Payer: Medicare Other | Admitting: Cardiovascular Disease

## 2023-10-09 VITALS — BP 138/78 | HR 86 | Resp 16 | Ht 62.0 in | Wt 213.0 lb

## 2023-10-09 DIAGNOSIS — R0602 Shortness of breath: Secondary | ICD-10-CM

## 2023-10-09 DIAGNOSIS — I779 Disorder of arteries and arterioles, unspecified: Secondary | ICD-10-CM | POA: Diagnosis not present

## 2023-10-09 DIAGNOSIS — E118 Type 2 diabetes mellitus with unspecified complications: Secondary | ICD-10-CM

## 2023-10-09 DIAGNOSIS — R609 Edema, unspecified: Secondary | ICD-10-CM

## 2023-10-09 DIAGNOSIS — E7849 Other hyperlipidemia: Secondary | ICD-10-CM

## 2023-10-09 DIAGNOSIS — I10 Essential (primary) hypertension: Secondary | ICD-10-CM

## 2023-10-09 DIAGNOSIS — I25118 Atherosclerotic heart disease of native coronary artery with other forms of angina pectoris: Secondary | ICD-10-CM | POA: Diagnosis not present

## 2023-10-09 DIAGNOSIS — E669 Obesity, unspecified: Secondary | ICD-10-CM

## 2023-10-09 MED ORDER — PANTOPRAZOLE SODIUM 40 MG PO TBEC: 40.0000 mg | DELAYED_RELEASE_TABLET | Freq: Every day | ORAL | 1 refills | Status: DC

## 2023-10-09 MED ORDER — LOSARTAN POTASSIUM 50 MG PO TABS: 50.0000 mg | ORAL_TABLET | Freq: Every day | ORAL | 3 refills | Status: AC

## 2023-10-09 NOTE — Patient Instructions (Signed)
 Medication Instructions:  INCREASE YOUR LOSARTAN  TO 50 MG DAILY   START PANTOPRAZOLE  40 MG DAILY   TAKE AN EXTRA FUROSEMIDE FOR 2 DAYS ONLY   *If you need a refill on your cardiac medications before your next appointment, please call your pharmacy*  Lab Work: BMET/BNP TODAY   If you have labs (blood work) drawn today and your tests are completely normal, you will receive your results only by: MyChart Message (if you have MyChart) OR A paper copy in the mail If you have any lab test that is abnormal or we need to change your treatment, we will call you to review the results.  Testing/Procedures: Your physician has requested that you have an echocardiogram. Echocardiography is a painless test that uses sound waves to create images of your heart. It provides your doctor with information about the size and shape of your heart and how well your heart's chambers and valves are working. This procedure takes approximately one hour. There are no restrictions for this procedure. Please do NOT wear cologne, perfume, aftershave, or lotions (deodorant is allowed). Please arrive 15 minutes prior to your appointment time.  Please note: We ask at that you not bring children with you during ultrasound (echo/ vascular) testing. Due to room size and safety concerns, children are not allowed in the ultrasound rooms during exams. Our front office staff cannot provide observation of children in our lobby area while testing is being conducted. An adult accompanying a patient to their appointment will only be allowed in the ultrasound room at the discretion of the ultrasound technician under special circumstances. We apologize for any inconvenience.   Follow-Up: At [facility], you and your health needs are our priority.  As part of our continuing mission to provide you with exceptional heart care, our providers are all part of one team.  This team includes your primary Cardiologist (physician) and  Advanced Practice Providers or APPs (Physician Assistants and Nurse Practitioners) who all work together to provide you with the care you need, when you need it.  Your next appointment:   3 month(s)  Provider:   Maudine Sos, MD, Slater Duncan, NP, or Neomi Banks, NP     We recommend signing up for the patient portal called "MyChart".  Sign up information is provided on this After Visit Summary.  MyChart is used to connect with patients for Virtual Visits (Telemedicine).  Patients are able to view lab/test results, encounter notes, upcoming appointments, etc.  Non-urgent messages can be sent to your provider as well.   To learn more about what you can do with MyChart, go to [email or web address].

## 2023-10-10 LAB — BASIC METABOLIC PANEL WITH GFR
BUN/Creatinine Ratio: 15 (ref 12–28)
BUN: 18 mg/dL (ref 8–27)
CO2: 22 mmol/L (ref 20–29)
Calcium: 9.5 mg/dL (ref 8.7–10.3)
Chloride: 108 mmol/L — ABNORMAL HIGH (ref 96–106)
Creatinine, Ser: 1.24 mg/dL — ABNORMAL HIGH (ref 0.57–1.00)
Glucose: 153 mg/dL — ABNORMAL HIGH (ref 70–99)
Potassium: 4.5 mmol/L (ref 3.5–5.2)
Sodium: 145 mmol/L — ABNORMAL HIGH (ref 134–144)
eGFR: 46 mL/min/{1.73_m2} — ABNORMAL LOW (ref >59)

## 2023-10-10 LAB — BRAIN NATRIURETIC PEPTIDE: BNP: 52.6 pg/mL (ref 0.0–100.0)

## 2023-10-22 ENCOUNTER — Encounter (HOSPITAL_BASED_OUTPATIENT_CLINIC_OR_DEPARTMENT_OTHER): Payer: Self-pay | Admitting: Cardiovascular Disease

## 2023-10-25 ENCOUNTER — Encounter (HOSPITAL_BASED_OUTPATIENT_CLINIC_OR_DEPARTMENT_OTHER): Payer: Self-pay | Admitting: Cardiovascular Disease

## 2023-10-31 ENCOUNTER — Ambulatory Visit (INDEPENDENT_AMBULATORY_CARE_PROVIDER_SITE_OTHER)

## 2023-10-31 DIAGNOSIS — R600 Localized edema: Secondary | ICD-10-CM

## 2023-10-31 DIAGNOSIS — I10 Essential (primary) hypertension: Secondary | ICD-10-CM | POA: Diagnosis not present

## 2023-10-31 DIAGNOSIS — E669 Obesity, unspecified: Secondary | ICD-10-CM | POA: Diagnosis not present

## 2023-10-31 DIAGNOSIS — R0602 Shortness of breath: Secondary | ICD-10-CM | POA: Diagnosis not present

## 2023-10-31 DIAGNOSIS — R609 Edema, unspecified: Secondary | ICD-10-CM

## 2023-10-31 LAB — ECHOCARDIOGRAM COMPLETE
AR max vel: 1.97 cm2
AV Area VTI: 2.38 cm2
AV Area mean vel: 2.15 cm2
AV Mean grad: 2 mmHg
AV Peak grad: 4.5 mmHg
AV Vena cont: 0.2 cm
Ao pk vel: 1.06 m/s
Area-P 1/2: 2.51 cm2
S' Lateral: 2.91 cm

## 2023-11-03 ENCOUNTER — Ambulatory Visit (HOSPITAL_BASED_OUTPATIENT_CLINIC_OR_DEPARTMENT_OTHER): Payer: Self-pay | Admitting: Cardiovascular Disease

## 2023-11-07 ENCOUNTER — Other Ambulatory Visit (HOSPITAL_BASED_OUTPATIENT_CLINIC_OR_DEPARTMENT_OTHER): Payer: Self-pay | Admitting: Cardiovascular Disease

## 2023-12-04 ENCOUNTER — Other Ambulatory Visit (HOSPITAL_BASED_OUTPATIENT_CLINIC_OR_DEPARTMENT_OTHER): Payer: Self-pay | Admitting: Cardiovascular Disease

## 2024-02-12 NOTE — Progress Notes (Unsigned)
 Cardiology Office Note:  .   Date:  02/13/2024  ID:  Darlene Rivera, DOB [birth year], MRN [id number] PCP: Delores Rojelio Caldron, NP  [facility] Providers Cardiologist:  Annabella Scarce, MD    History of Present Illness: .   Darlene Rivera is a 73 y.o. female with CAD (remote DES-Cx, 07/2015 LAD diagonal bifurcation intervention with provisional stenting of LAD and angioplasty of diagonal), HTN, HLD, DM2  LHC  10/2020 showed progression of her RCA from 50 to 70% proximally with diffuse 50% mid RCA disease unchanged. Her LAD stent was patent, the jailed Diagonal had an ostial 85% stenosis. Left Circumflex stent was patent. Medical management was advised and Imdur  was added.  Seen 08/2021 by Dr. Claudene with new RBBB and stable anginal symptoms recommended for PRN nitroglycerin . Due to calf tightness with walking with subsequent ABI 07/2022 unremarkable.    Established with Dr. Scarce 09/17/22 after Dr. Theressa retirement. Due to exertional dyspnea,  Ranexa  was initiated.  Plavix  and metoprolol  were continued.    Due to chest pain, underwent LHC 03/19/2023 revealing continued patency of stents in the proximal to mid LAD and proximal left circumflex.  Borderline disease in the proximal and mid RCA unchanged from cath 10/2020 which was not hemodynamically significant recommended for continued medical therapy.  At visit 10/09/23 BP elevated. Losartan  increased to 50mg  daily. Due to GERD, Protonix  added.  Echo due to LE edema 10/2023 normal LVEF 60-65%, gr1dd, RV normal, mild MVP, trivial AI.  Presents today for follow-up. Exercise with chair yoga and chair volleyball. Additionally stays active watching young children. She is pending anterior cervical discectomy and fusion C3C4 and C4/5 with Dr. Lonie. Notes Pantoprazole  improved acid reflux, occasionally needs additional tablet. Notes stable occasional chest discomfort at rest or with activity that is relieved by nitroglycerin . She also wonders if  some of her chest discomfort is radiculopathy from her neck issue.   ROS: Please see the history of present illness.    All other systems reviewed and are negative.   Studies Reviewed: SABRA   EKG Interpretation Date/Time:  Friday February 13 2024 08:34:05 EDT Ventricular Rate:  76 PR Interval:  152 QRS Duration:  128 QT Interval:  392 QTC Calculation: 441 R Axis:   23  Text Interpretation: Normal sinus rhythm Right bundle branch block  Stable from previous Confirmed by Vannie Mora ([id number]) on 02/13/2024 8:34:56 AM    Cardiac Studies & Procedures   ______________________________________________________________________________________________ CARDIAC CATHETERIZATION  CARDIAC CATHETERIZATION 03/19/2023  Conclusion Continued excellent patency of stents in the proximal to mid LAD and proximal LCx Borderline disease in the proximal and mid RCA. This has not changed since prior cath in May 2022. RFR is 0.90 across both lesion suggesting this is not hemodynamically significant. Mildly elevated LVEDP 22 mm Hg post hydration  Plan: recommend continued medical therapy. For future cardiac cath procedures I would consider alternative access given small radial artery size.  Findings Coronary Findings Diagnostic  Dominance: Right  Left Anterior Descending The vessel exhibits minimal luminal irregularities. Non-stenotic Prox LAD lesion was previously treated.  First Diagonal Branch Ost 1st Diag lesion is 85% stenosed. The lesion is discrete.  Left Circumflex The vessel exhibits minimal luminal irregularities. Non-stenotic Prox Cx lesion was previously treated.  Right Coronary Artery The vessel exhibits minimal luminal irregularities. Prox RCA lesion is 70% stenosed. The lesion is severely calcified. Pressure gradient was performed on the lesion. RFR: 0.9. Prox RCA to Mid RCA lesion is 50% stenosed. The lesion  is segmental. The lesion is moderately calcified. Pressure gradient was  performed on the lesion. RFR: 0.9. Dist RCA lesion is 30% stenosed.  Right Posterior Descending Artery The vessel exhibits minimal luminal irregularities.  Intervention  No interventions have been documented.   CARDIAC CATHETERIZATION  CARDIAC CATHETERIZATION 11/07/2020  Conclusion  There is progression of disease in the proximal RCA from 50% to 70%.  There is diffuse segmental 50% stenosis in the mid right coronary unchanged.  Short left main  Widely patent LAD including the mid vessel stent.  The jailed diagonal contains ostial 85% narrowing.  Patent stent in the proximal circumflex with generalized luminal irregularities noted.  Normal LVEDP.  RECOMMENDATIONS:   Continue aspirin  and Plavix .  Progress anti-ischemic therapy by adding long-acting nitrates, isosorbide  mononitrate 30 mg/day.  Aggressive risk factor modification.  If angina interferes with quality of life, consider stenting the entire proximal to distal RCA segment.  Findings Coronary Findings Diagnostic  Dominance: Right  Left Anterior Descending The vessel exhibits minimal luminal irregularities. Non-stenotic Prox LAD lesion was previously treated.  First Diagonal Branch Ost 1st Diag lesion is 85% stenosed. The lesion is discrete.  Left Circumflex The vessel exhibits minimal luminal irregularities. Non-stenotic Prox Cx lesion was previously treated.  Right Coronary Artery The vessel exhibits minimal luminal irregularities. Prox RCA lesion is 75% stenosed. Prox RCA to Mid RCA lesion is 45% stenosed. The lesion is segmental. The lesion is moderately calcified. Dist RCA lesion is 30% stenosed.  Right Posterior Descending Artery The vessel exhibits minimal luminal irregularities.  Intervention  No interventions have been documented.   STRESS TESTS  MYOCARDIAL PERFUSION IMAGING 07/14/2015  Interpretation Summary  Nuclear stress EF: 79%.  The left ventricular ejection fraction is  hyperdynamic (>65%).  There was no ST segment deviation noted during stress.  No T wave inversion was noted during stress.  Defect 1: There is a small defect of moderate severity present in the mid inferolateral location. Consistent with prior infarct in the left circumflex artery territory.  Defect 2: There is a small defect of moderate severity present in the apical anterior and apex location. Consistent with ischemia in the left anterior descending artery territory.  This is a low risk study.   ECHOCARDIOGRAM  ECHOCARDIOGRAM COMPLETE 10/31/2023  Narrative ECHOCARDIOGRAM REPORT    Patient Name:   Darlene Rivera Date of Exam: 10/31/2023 Medical Rec #:  [id number]          Height:       62.0 in Accession #:    [id number]         Weight:       213.0 lb Date of Birth:  [birth year]          BSA:          1.964 m Patient Age:    72 years           BP:           138/78 mmHg Patient Gender: F                  HR:           72 bpm. Exam Location:  Outpatient  Procedure: 2D Echo, Cardiac Doppler and Color Doppler (Both Spectral and Color Flow Doppler were utilized during procedure).  Indications:    R07.2 Precordial pain; R06.9 DOE; R60.0 Lower extremity edema  History:        Patient has no prior history of Echocardiogram examinations. CAD, Signs/Symptoms:Chest Pain, Dyspnea and  Edema; Risk Factors:Hypertension, Dyslipidemia and Non-Smoker. Patient has had intermittent chest pain, DOE and leg edema.  Sonographer:    Annabella Cater RVT, RDCS (AE), RDMS Referring Phys: [phone] [facility]   Sonographer Comments: Suboptimal parasternal window, suboptimal apical window, suboptimal subcostal window and patient is obese. Image acquisition challenging due to patient body habitus. IMPRESSIONS   1. Left ventricular ejection fraction, by estimation, is 60 to 65%. The left ventricle has normal function. The left ventricle has no regional wall motion abnormalities. Left ventricular  diastolic parameters are consistent with Grade I diastolic dysfunction (impaired relaxation). 2. Right ventricular systolic function is normal. The right ventricular size is normal. 3. The mitral valve is grossly normal. No evidence of mitral valve regurgitation. No evidence of mitral stenosis. There is mild late systolic prolapse of the middle scallop of the posterior leaflet of the mitral valve. 4. The aortic valve was not well visualized. Aortic valve regurgitation is trivial. No aortic stenosis is present. 5. The inferior vena cava is normal in size with greater than 50% respiratory variability, suggesting right atrial pressure of 3 mmHg. 6. Technically difficult study.  Comparison(s): No prior Echocardiogram.  FINDINGS Left Ventricle: No Strain of 3D Transmitted. Left ventricular ejection fraction, by estimation, is 60 to 65%. The left ventricle has normal function. The left ventricle has no regional wall motion abnormalities. Strain was performed and the global longitudinal strain is indeterminate. The left ventricular internal cavity size was normal in size. There is no left ventricular hypertrophy. Left ventricular diastolic parameters are consistent with Grade I diastolic dysfunction (impaired relaxation).  Right Ventricle: The right ventricular size is normal. No increase in right ventricular wall thickness. Right ventricular systolic function is normal.  Left Atrium: Left atrial size was normal in size.  Right Atrium: Right atrial size was normal in size.  Pericardium: There is no evidence of pericardial effusion.  Mitral Valve: The mitral valve is grossly normal. There is mild late systolic prolapse of the middle scallop of the posterior leaflet of the mitral valve. No evidence of mitral valve regurgitation. No evidence of mitral valve stenosis.  Tricuspid Valve: The tricuspid valve is normal in structure. Tricuspid valve regurgitation is not demonstrated. No evidence of tricuspid  stenosis.  Aortic Valve: The aortic valve was not well visualized. Aortic valve regurgitation is trivial. No aortic stenosis is present. Aortic valve mean gradient measures 2.0 mmHg. Aortic valve peak gradient measures 4.5 mmHg. Aortic valve area, by VTI measures 2.38 cm.  Pulmonic Valve: The pulmonic valve was not well visualized. Pulmonic valve regurgitation is not visualized. No evidence of pulmonic stenosis.  Aorta: The aortic root and ascending aorta are structurally normal, with no evidence of dilitation.  Venous: The inferior vena cava is normal in size with greater than 50% respiratory variability, suggesting right atrial pressure of 3 mmHg.  IAS/Shunts: The atrial septum is grossly normal.  Additional Comments: 3D was performed not requiring image post processing on an independent workstation and was indeterminate.   LEFT VENTRICLE PLAX 2D LVIDd:         4.63 cm   Diastology LVIDs:         2.91 cm   LV e' medial:    5.12 cm/s LV PW:         0.84 cm   LV E/e' medial:  12.4 LV IVS:        0.77 cm   LV e' lateral:   6.41 cm/s LVOT diam:     1.80 cm  LV E/e' lateral: 9.9 LV SV:         57 LV SV Index:   29 LVOT Area:     2.54 cm   RIGHT VENTRICLE RV S prime:     9.41 cm/s TAPSE (M-mode): 2.2 cm  LEFT ATRIUM             Index        RIGHT ATRIUM           Index LA diam:        3.90 cm 1.99 cm/m   RA Area:     12.50 cm LA Vol (A2C):   54.5 ml 27.76 ml/m  RA Volume:   29.00 ml  14.77 ml/m LA Vol (A4C):   31.0 ml 15.79 ml/m LA Biplane Vol: 43.2 ml 22.00 ml/m AORTIC VALVE                    PULMONIC VALVE AV Area (Vmax):    1.97 cm     PV Vmax:       0.89 m/s AV Area (Vmean):   2.15 cm     PV Peak grad:  3.1 mmHg AV Area (VTI):     2.38 cm AV Vmax:           106.00 cm/s AV Vmean:          66.700 cm/s AV VTI:            0.239 m AV Peak Grad:      4.5 mmHg AV Mean Grad:      2.0 mmHg LVOT Vmax:         82.20 cm/s LVOT Vmean:        56.400 cm/s LVOT VTI:           0.224 m LVOT/AV VTI ratio: 0.94 AR Vena Contracta: 0.20 cm  AORTA Ao Root diam: 2.60 cm Ao Asc diam:  2.80 cm Ao Arch diam: 2.1 cm  MITRAL VALVE MV Area (PHT): 2.51 cm    SHUNTS MV Decel Time: 302 msec    Systemic VTI:  0.22 m MV E velocity: 63.60 cm/s  Systemic Diam: 1.80 cm MV A velocity: 89.10 cm/s MV E/A ratio:  0.71  Stanly Leavens MD Electronically signed by Stanly Leavens MD Signature Date/Time: 10/31/2023/10:03:08 AM    Final          ______________________________________________________________________________________________         Risk Assessment/Calculations:             Physical Exam:   VS:  BP 122/60 (BP Location: Left Arm, Patient Position: Sitting, Cuff Size: Large)   Pulse 75   Ht 5' 1 (1.549 m)   Wt 210 lb (95.3 kg)   SpO2 95%   BMI 39.68 kg/m    Wt Readings from Last 3 Encounters:  02/13/24 210 lb (95.3 kg)  10/09/23 213 lb (96.6 kg)  07/02/23 215 lb (97.5 kg)    GEN: Well nourished, well developed in no acute distress NECK: No JVD; No carotid bruits CARDIAC: RRR, no murmurs, rubs, gallops RESPIRATORY:  Clear to auscultation without rales, wheezing or rhonchi  ABDOMEN: Soft, non-tender, non-distended EXTREMITIES:  No edema; No deformity   ASSESSMENT AND PLAN: .    Preop - According to the Revised Cardiac Risk Index (RCRI), her Perioperative Risk of Major Cardiac Event is (%): 6.6. Her Functional Capacity in METs is: 5.62 according to the [facility] Activity Status Index (DASI). Per AHA/ACC guidelines, she is deemed acceptable  risk for the planned procedure without additional cardiovascular testing. Will route to surgical team so they are aware.  Hold Plavix  5 days prior to procedure.   CAD s/p PCI -  Known 70% RCA lesion again recommended for medical management at [facility] 03/19/23. Intermittent chest pain with mixed typical and atypical features overall stable from previous. Able to achieve >4METS. Some of her chest discomfort may  be radiculopathy related to neck issues. GDMT includes Plavix  75mg  daily, ranexa  500mg  BID, Imdur  120mg  daily, Metoprolol  tartrate 50mg  BID, Rosuvastatin  20mg  daily. Recommend aiming for 150 minutes of moderate intensity activity per week and following a heart healthy diet.     Hypertension- BP well controlled. Continue current antihypertensive regimen imdur  120mg  daily, lasix 40mg  daily, losartan  50mg  daily. Discussed to monitor BP at home at least 2 hours after medications and sitting for 5-10 minutes.    Hyperlipidemia, LDL goal less than 70 - continue rosuvastatin  20 mg daily.            Dispo: follow up in 6 mos  Signed, Reche GORMAN Finder, NP

## 2024-02-13 ENCOUNTER — Ambulatory Visit (HOSPITAL_BASED_OUTPATIENT_CLINIC_OR_DEPARTMENT_OTHER): Admitting: Family

## 2024-02-13 ENCOUNTER — Other Ambulatory Visit (HOSPITAL_BASED_OUTPATIENT_CLINIC_OR_DEPARTMENT_OTHER): Payer: Self-pay | Admitting: *Deleted

## 2024-02-13 ENCOUNTER — Encounter (HOSPITAL_BASED_OUTPATIENT_CLINIC_OR_DEPARTMENT_OTHER): Payer: Self-pay | Admitting: Family

## 2024-02-13 VITALS — BP 122/60 | HR 75 | Ht 61.0 in | Wt 210.0 lb

## 2024-02-13 DIAGNOSIS — Z01810 Encounter for preprocedural cardiovascular examination: Secondary | ICD-10-CM

## 2024-02-13 DIAGNOSIS — I25118 Atherosclerotic heart disease of native coronary artery with other forms of angina pectoris: Secondary | ICD-10-CM

## 2024-02-13 DIAGNOSIS — E785 Hyperlipidemia, unspecified: Secondary | ICD-10-CM

## 2024-02-13 MED ORDER — PANTOPRAZOLE SODIUM 40 MG PO TBEC: 40.0000 mg | DELAYED_RELEASE_TABLET | Freq: Every day | ORAL | 3 refills | Status: AC

## 2024-02-13 NOTE — Patient Instructions (Addendum)
 Medication Instructions:   Your physician recommends that you continue on your current medications as directed. Please refer to the Current Medication list given to you today.   *If you need a refill on your cardiac medications before your next appointment, please call your pharmacy*  Lab Work:  None ordered.  If you have labs (blood work) drawn today and your tests are completely normal, you will receive your results only by: MyChart Message (if you have MyChart) OR A paper copy in the mail If you have any lab test that is abnormal or we need to change your treatment, we will call you to review the results.  Testing/Procedures:  None ordered.  Follow-Up: At [facility], you and your health needs are our priority.  As part of our continuing mission to provide you with exceptional heart care, our providers are all part of one team.  This team includes your primary Cardiologist (physician) and Advanced Practice Providers or APPs (Physician Assistants and Nurse Practitioners) who all work together to provide you with the care you need, when you need it.  Your next appointment:   6 month(s)  Provider:   Annabella Scarce, MD, Rosaline Bane, NP, or Reche Finder, NP    We recommend signing up for the patient portal called MyChart.  Sign up information is provided on this After Visit Summary.  MyChart is used to connect with patients for Virtual Visits (Telemedicine).  Patients are able to view lab/test results, encounter notes, upcoming appointments, etc.  Non-urgent messages can be sent to your provider as well.   To learn more about what you can do with MyChart, go to [email or web address].   Other Instructions  Your physician wants you to follow-up in: 6 months.  You will receive a reminder letter in the mail two months in advance. If you don't receive a letter, please call our office to schedule the follow-up appointment.      Hold Plavix  (Clopidogrel ) 5 days  prior to surgery. Hold fish oil 7 days prior to surgery  If you have breakthrough symptoms of acid reflux despite Pantoprazole  (Protonix ) could consider Famotidine (Pepcid) over the counter as needed up to twice per day.

## 2024-02-17 ENCOUNTER — Other Ambulatory Visit: Payer: Self-pay | Admitting: Nephrology

## 2024-02-17 DIAGNOSIS — N1832 Chronic kidney disease, stage 3b: Secondary | ICD-10-CM

## 2024-02-27 ENCOUNTER — Ambulatory Visit
Admission: RE | Admit: 2024-02-27 | Discharge: 2024-02-27 | Disposition: A | Discharge status: Home / Self Care | Source: Ambulatory Visit | Attending: Nephrology | Admitting: Nephrology

## 2024-02-27 DIAGNOSIS — N1832 Chronic kidney disease, stage 3b: Secondary | ICD-10-CM

## 2024-03-05 ENCOUNTER — Other Ambulatory Visit: Payer: Self-pay

## 2024-03-05 MED ORDER — METOPROLOL TARTRATE 50 MG PO TABS: 50.0000 mg | ORAL_TABLET | Freq: Two times a day (BID) | ORAL | 3 refills | Status: AC

## 2024-05-06 ENCOUNTER — Other Ambulatory Visit (HOSPITAL_BASED_OUTPATIENT_CLINIC_OR_DEPARTMENT_OTHER): Payer: Self-pay | Admitting: Cardiovascular Disease

## 2024-05-19 ENCOUNTER — Other Ambulatory Visit: Payer: Self-pay | Admitting: Cardiovascular Disease

## 2024-05-21 ENCOUNTER — Telehealth: Payer: Self-pay | Admitting: Cardiovascular Disease

## 2024-05-21 MED ORDER — ISOSORBIDE MONONITRATE ER 120 MG PO TB24: 120.0000 mg | ORAL_TABLET | Freq: Every day | ORAL | 1 refills | Status: AC

## 2024-05-21 NOTE — Telephone Encounter (Signed)
*  STAT* If patient is at the pharmacy, call can be transferred to refill team.   1. Which medications need to be refilled? (please list name of each medication and dose if known)  isosorbide  mononitrate (IMDUR ) 120 MG 24 hr tablet     2. Would you like to learn more about the convenience, safety, & potential cost savings by using the [facility]? no   3. Are you open to using the [facility] (Type [facility]. no  4. Which pharmacy/location (including street and city if local pharmacy) is medication to be sent to?  [facility] - [place], [place] - [address]   5. Do they need a 30 day or 90 day supply? 90 day    Pt is completely out.

## 2024-05-31 ENCOUNTER — Other Ambulatory Visit (HOSPITAL_BASED_OUTPATIENT_CLINIC_OR_DEPARTMENT_OTHER): Payer: Self-pay | Admitting: Family

## 2024-08-02 ENCOUNTER — Encounter (HOSPITAL_BASED_OUTPATIENT_CLINIC_OR_DEPARTMENT_OTHER): Admitting: Internal Medicine

## 2024-08-13 ENCOUNTER — Ambulatory Visit (HOSPITAL_BASED_OUTPATIENT_CLINIC_OR_DEPARTMENT_OTHER): Admitting: Family
